# Patient Record
Sex: Female | Born: 1950 | Race: Black or African American | Hispanic: No | Marital: Married | Smoking: Never smoker
Health system: Southern US, Community
[De-identification: ages and names within clinical notes are randomized; demographics above are authoritative.]

## PROBLEM LIST (undated history)

## (undated) DIAGNOSIS — H269 Unspecified cataract: Secondary | ICD-10-CM

## (undated) DIAGNOSIS — E785 Hyperlipidemia, unspecified: Secondary | ICD-10-CM

## (undated) DIAGNOSIS — M1990 Unspecified osteoarthritis, unspecified site: Secondary | ICD-10-CM

## (undated) DIAGNOSIS — I10 Essential (primary) hypertension: Secondary | ICD-10-CM

## (undated) DIAGNOSIS — E119 Type 2 diabetes mellitus without complications: Secondary | ICD-10-CM

## (undated) DIAGNOSIS — B54 Unspecified malaria: Secondary | ICD-10-CM

## (undated) HISTORY — DX: Unspecified cataract: H26.9

## (undated) HISTORY — DX: Hyperlipidemia, unspecified: E78.5

## (undated) HISTORY — PX: CATARACT EXTRACTION, BILATERAL: SHX1313

## (undated) HISTORY — DX: Unspecified osteoarthritis, unspecified site: M19.90

---

## 2004-06-02 DIAGNOSIS — I10 Essential (primary) hypertension: Secondary | ICD-10-CM

## 2004-06-02 HISTORY — DX: Essential (primary) hypertension: I10

## 2014-09-20 DIAGNOSIS — B54 Unspecified malaria: Secondary | ICD-10-CM

## 2014-09-20 HISTORY — DX: Unspecified malaria: B54

## 2014-09-27 ENCOUNTER — Encounter (HOSPITAL_BASED_OUTPATIENT_CLINIC_OR_DEPARTMENT_OTHER): Payer: Self-pay | Admitting: *Deleted

## 2014-09-27 ENCOUNTER — Emergency Department (HOSPITAL_BASED_OUTPATIENT_CLINIC_OR_DEPARTMENT_OTHER): Payer: Self-pay

## 2014-09-27 ENCOUNTER — Inpatient Hospital Stay (HOSPITAL_BASED_OUTPATIENT_CLINIC_OR_DEPARTMENT_OTHER)
Admission: EM | Admit: 2014-09-27 | Discharge: 2014-10-02 | DRG: 871 | Disposition: A | Discharge status: Home Health | Payer: Self-pay | Attending: Internal Medicine | Admitting: Internal Medicine

## 2014-09-27 DIAGNOSIS — B509 Plasmodium falciparum malaria, unspecified: Secondary | ICD-10-CM | POA: Diagnosis present

## 2014-09-27 DIAGNOSIS — A419 Sepsis, unspecified organism: Principal | ICD-10-CM | POA: Diagnosis present

## 2014-09-27 DIAGNOSIS — E872 Acidosis: Secondary | ICD-10-CM | POA: Diagnosis present

## 2014-09-27 DIAGNOSIS — N179 Acute kidney failure, unspecified: Secondary | ICD-10-CM | POA: Diagnosis present

## 2014-09-27 DIAGNOSIS — E871 Hypo-osmolality and hyponatremia: Secondary | ICD-10-CM | POA: Diagnosis present

## 2014-09-27 DIAGNOSIS — Z8613 Personal history of malaria: Secondary | ICD-10-CM

## 2014-09-27 DIAGNOSIS — D594 Other nonautoimmune hemolytic anemias: Secondary | ICD-10-CM | POA: Diagnosis present

## 2014-09-27 DIAGNOSIS — R740 Nonspecific elevation of levels of transaminase and lactic acid dehydrogenase [LDH]: Secondary | ICD-10-CM | POA: Diagnosis present

## 2014-09-27 DIAGNOSIS — R6521 Severe sepsis with septic shock: Secondary | ICD-10-CM | POA: Diagnosis present

## 2014-09-27 DIAGNOSIS — D696 Thrombocytopenia, unspecified: Secondary | ICD-10-CM

## 2014-09-27 DIAGNOSIS — B54 Unspecified malaria: Secondary | ICD-10-CM | POA: Diagnosis present

## 2014-09-27 DIAGNOSIS — I10 Essential (primary) hypertension: Secondary | ICD-10-CM | POA: Diagnosis present

## 2014-09-27 DIAGNOSIS — R17 Unspecified jaundice: Secondary | ICD-10-CM | POA: Diagnosis present

## 2014-09-27 DIAGNOSIS — Z452 Encounter for adjustment and management of vascular access device: Secondary | ICD-10-CM

## 2014-09-27 DIAGNOSIS — D649 Anemia, unspecified: Secondary | ICD-10-CM

## 2014-09-27 DIAGNOSIS — E876 Hypokalemia: Secondary | ICD-10-CM | POA: Diagnosis present

## 2014-09-27 DIAGNOSIS — D6959 Other secondary thrombocytopenia: Secondary | ICD-10-CM | POA: Diagnosis present

## 2014-09-27 HISTORY — DX: Unspecified malaria: B54

## 2014-09-27 HISTORY — DX: Essential (primary) hypertension: I10

## 2014-09-27 LAB — CBC
HCT: 33.3 % — ABNORMAL LOW (ref 36.0–46.0)
HEMOGLOBIN: 12.5 g/dL (ref 12.0–15.0)
MCH: 29.1 pg (ref 26.0–34.0)
MCHC: 37.5 g/dL — ABNORMAL HIGH (ref 30.0–36.0)
MCV: 77.6 fL — ABNORMAL LOW (ref 78.0–100.0)
PLATELETS: 18 10*3/uL — AB (ref 150–400)
RBC: 4.29 MIL/uL (ref 3.87–5.11)
RDW: 12.8 % (ref 11.5–15.5)
WBC: 9.5 10*3/uL (ref 4.0–10.5)

## 2014-09-27 LAB — URINE MICROSCOPIC-ADD ON

## 2014-09-27 LAB — URINALYSIS, ROUTINE W REFLEX MICROSCOPIC
Glucose, UA: >1000 mg/dL — AB
KETONES UR: 15 mg/dL — AB
NITRITE: POSITIVE — AB
Protein, ur: 100 mg/dL — AB
Specific Gravity, Urine: 1.025 (ref 1.005–1.030)
UROBILINOGEN UA: 4 mg/dL — AB (ref 0.0–1.0)
pH: 5 (ref 5.0–8.0)

## 2014-09-27 LAB — COMPREHENSIVE METABOLIC PANEL
ALBUMIN: 2.9 g/dL — AB (ref 3.5–5.2)
ALK PHOS: 122 U/L — AB (ref 39–117)
ALT: 33 U/L (ref 0–35)
ANION GAP: 18 — AB (ref 5–15)
AST: 83 U/L — ABNORMAL HIGH (ref 0–37)
BUN: 25 mg/dL — AB (ref 6–23)
CO2: 23 mmol/L (ref 19–32)
CREATININE: 1.28 mg/dL — AB (ref 0.50–1.10)
Calcium: 7.8 mg/dL — ABNORMAL LOW (ref 8.4–10.5)
Chloride: 86 mmol/L — ABNORMAL LOW (ref 96–112)
GFR calc non Af Amer: 44 mL/min — ABNORMAL LOW (ref >90)
GFR, EST AFRICAN AMERICAN: 51 mL/min — AB (ref >90)
GLUCOSE: 289 mg/dL — AB (ref 70–99)
Potassium: 2.7 mmol/L — CL (ref 3.5–5.1)
Sodium: 127 mmol/L — ABNORMAL LOW (ref 135–145)
TOTAL PROTEIN: 6.8 g/dL (ref 6.0–8.3)
Total Bilirubin: 4 mg/dL — ABNORMAL HIGH (ref 0.3–1.2)

## 2014-09-27 LAB — I-STAT CG4 LACTIC ACID, ED: LACTIC ACID, VENOUS: 7.71 mmol/L — AB (ref 0.5–2.0)

## 2014-09-27 MED ORDER — SODIUM CHLORIDE 0.9 % IV BOLUS (SEPSIS)
1000.0000 mL | Freq: Once | INTRAVENOUS | Status: AC
Start: 2014-09-27 — End: 2014-09-27
  Administered 2014-09-27: 1000 mL via INTRAVENOUS

## 2014-09-27 MED ORDER — DOXYCYCLINE HYCLATE 100 MG IV SOLR
100.0000 mg | Freq: Two times a day (BID) | INTRAVENOUS | Status: DC
  Administered 2014-09-28 – 2014-09-29 (×3): 100 mg via INTRAVENOUS
  Filled 2014-09-27 (×4): qty 100

## 2014-09-27 MED ORDER — ACETAMINOPHEN 500 MG PO TABS
1000.0000 mg | ORAL_TABLET | Freq: Once | ORAL | Status: AC
  Administered 2014-09-27: 1000 mg via ORAL
  Filled 2014-09-27: qty 2

## 2014-09-27 MED ORDER — DOXYCYCLINE HYCLATE 100 MG PO TABS
100.0000 mg | ORAL_TABLET | Freq: Once | ORAL | Status: AC
  Administered 2014-09-27: 100 mg via ORAL

## 2014-09-27 MED ORDER — SODIUM CHLORIDE 0.9 % IV SOLN
12.0000 mg/kg | Freq: Three times a day (TID) | INTRAVENOUS | Status: DC
  Filled 2014-09-27: qty 11.57

## 2014-09-27 MED ORDER — SODIUM CHLORIDE 0.9 % IV SOLN
12.0000 mg/kg | Freq: Three times a day (TID) | INTRAVENOUS | Status: DC
  Administered 2014-09-28 – 2014-09-29 (×4): 925.6 mg via INTRAVENOUS
  Filled 2014-09-27 (×7): qty 11.57

## 2014-09-27 MED ORDER — POTASSIUM CHLORIDE 10 MEQ/100ML IV SOLN
10.0000 meq | Freq: Once | INTRAVENOUS | Status: AC
  Administered 2014-09-27: 10 meq via INTRAVENOUS
  Filled 2014-09-27: qty 100

## 2014-09-27 MED ORDER — SODIUM CHLORIDE 0.9 % IV SOLN
24.0000 mg/kg | Freq: Once | INTRAVENOUS | Status: DC
  Filled 2014-09-27: qty 23.13

## 2014-09-27 MED ORDER — POTASSIUM CHLORIDE CRYS ER 20 MEQ PO TBCR
40.0000 meq | EXTENDED_RELEASE_TABLET | Freq: Once | ORAL | Status: AC
  Administered 2014-09-27: 40 meq via ORAL
  Filled 2014-09-27: qty 2

## 2014-09-27 MED ORDER — DOXYCYCLINE HYCLATE 100 MG IV SOLR: 100.0000 mg | Freq: Two times a day (BID) | INTRAVENOUS | Status: DC

## 2014-09-27 MED ORDER — DOXYCYCLINE HYCLATE 100 MG PO TABS
ORAL_TABLET | ORAL | Status: AC
  Administered 2014-09-27: 100 mg via ORAL
  Filled 2014-09-27: qty 1

## 2014-09-27 MED ORDER — SODIUM CHLORIDE 0.9 % IV SOLN
1850.0000 mg | Freq: Once | INTRAVENOUS | Status: AC
Start: 2014-09-27 — End: 2014-09-28
  Administered 2014-09-28: 1850 mg via INTRAVENOUS
  Filled 2014-09-27: qty 23.13

## 2014-09-27 NOTE — ED Notes (Signed)
Infection prevention RN called post [place] screening questions and spoke with Willey Blade RN at [phone].

## 2014-09-27 NOTE — Consult Note (Signed)
PULMONARY  / CRITICAL CARE MEDICINE  Name: [name] MRN: [id number] DOB: 1951-03-31    LOS: 10  REFERRING MD :  Dr. Baxter Flattery  CHIEF COMPLAINT:  Fever, myalgia, nonbloody diarrhea  BRIEF PATIENT DESCRIPTION: [name] is a 64 year old female with hypertension from [place] who presented to [place] with subacute onset fever/chills associated with weakness, diarrhea, dark urine concerning for malaria.  HISTORY OF PRESENT ILLNESS: [name] is a 64 year old female with hypertension from [place] who presented to [place] with fever/chills associated with weakness, diarrhea, dark urine, headache localized to "behind her eyes" that began on 4/22 and 4/23. Before then, she first traveled to [place] from [place] to visit her family on 4/11 and was in her normal state of health though attributes going outside in cold weather without wearing a jacket as the cause of her current presentation. She also reports being evaluated by her regular doctor prior to leaving [place] without any abnormal findings. Of note, she also reports a past history of malaria, roughly 7-8 episodes since she was a child. Her fevers have been most notable over the last 3 days and occur during both daytime and nighttime. She denies any recent surgery, recent hospitalization, sick contacts, animal contact, recent travel to other countries though does live with other individuals at home and sells eggs for living. At outside hospital, she was found to have a fever 494.4 with systolic BP 96P-591M, and initial lab work was notable for Na 127, K 2.7, bilirubin 4, lactate 7.7, and peripheral smear notable for high parasite burden [unavailable for review]. ID was consulted and recommended management for severe malaria with doxycycline IV and quinidine IV, so she was transferred to [facility]. PCCM was consulted for closer monitoring.  PAST MEDICAL HISTORY :  Past Medical History  Diagnosis Date  . Hypertension    Past  Surgical History  Procedure Laterality Date  . Cesarean section     Prior to Admission medications   Medication Sig Start Date End Date Taking? Authorizing Provider  ibuprofen (ADVIL,MOTRIN) 400 MG tablet Take 400 mg by mouth every 6 (six) hours as needed.   Yes Historical Provider, MD   No Known Allergies  FAMILY HISTORY:  History reviewed. No pertinent family history. SOCIAL HISTORY:  reports that she has never smoked. She does not have any smokeless tobacco history on file. She reports that she does not drink alcohol. Her drug history is not on file.  REVIEW OF SYSTEMS:  As noted in the history of present illness   INTERVAL HISTORY:   VITAL SIGNS: Temp:  [98.4 F (36.9 C)-100.5 F (38.1 C)] 98.4 F (36.9 C) (04/27 2315) Pulse Rate:  [75-116] 80 (04/28 0245) Resp:  [16-30] 25 (04/28 0245) BP: (77-122)/(33-69) 77/51 mmHg (04/28 0245) SpO2:  [94 %-100 %] 94 % (04/28 0245) Weight:  [170 lb (77.111 kg)-172 lb 9.9 oz (78.3 kg)] 172 lb 9.9 oz (78.3 kg) (04/27 2315) HEMODYNAMICS:   VENTILATOR SETTINGS:   INTAKE / OUTPUT: Intake/Output      04/27 0701 - 04/28 0700   IV Piggyback 2750   Total Intake(mL/kg) 2750 (35.1)   Urine (mL/kg/hr) 1100   Total Output 1100   Net +1650        PHYSICAL EXAMINATION: General: Elderly African woman, resting in bed, NAD Neuro: responds to questions appropriately; moving all extremities freely HEENT: PERRL, EOMI, mild scleral icterus, oropharynx clear with sublingual jaundice Cardiac: RRR, no rubs, murmurs or gallops Pulm: clear to auscultation bilaterally,  no wheezes, rales, or rhonchi Abd: soft, nontender, nondistended, BS present Ext: warm and well perfused, no pedal edema  LABS: Cbc  Recent Labs Lab 09/27/14 1845  WBC 9.5  HGB 12.5  HCT 33.3*  PLT 18*   Chemistry  Recent Labs Lab 09/27/14 1845 09/28/14 0047  NA 127* 128*  K 2.7* 3.5  CL 86* 94*  CO2 23 24  BUN 25* 17  CREATININE 1.28* 0.94  CALCIUM 7.8* 7.0*   GLUCOSE 289* 340*   Liver fxn  Recent Labs Lab 09/27/14 1845 09/28/14 0047  AST 83* 51*  ALT 33 24  ALKPHOS 122* 80  BILITOT 4.0* 3.6*  PROT 6.8 5.2*  ALBUMIN 2.9* 2.0*   coags  Recent Labs Lab 09/28/14 0047  INR 1.12   Sepsis markers  Recent Labs Lab 09/27/14 1855  LATICACIDVEN 7.71*   DIAGNOSES: Active Problems:   Malaria   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  PLAN:   Continue following  CARDIOVASCULAR  ASSESSMENT:  Septic shock: Likely secondary to malaria. Blood pressure minimally responsive to 3.5 L. Baseline EKG findings reassuring for no acute ischemia.  PLAN:  Place central line for pressor support Check baseline EKG for QTC and every 8 hours thereafter. Continue cardiac monitoring for adverse effects secondary to quinidine: Hourly QTC monitoring/ Stop infusion if greater than 50% of baseline value. Stop infusion for 1 hour and decrease rate infusion if greater than 25% of baseline, and call Dr. Graylon Good [phone]  RENAL  ASSESSMENT:   Acute kidney injury: Creatinine 1.3 on admission, baseline unknown, though improving. Hyponatremia: Likely hypovolemic in the setting of her acute illness.  Hypokalemia: Likely 2/2 diarrhea. Resolved with K supplementation.  PLAN:   Continue normal saline at 125 mL/hour   GASTROINTESTINAL  ASSESSMENT:   Abnormal LFTs: Likely secondary to malaria. Elevated AST, alkaline phosphatase Hyperbilirubinemia: Likely secondary to hemolysis in the setting of malaria.   PLAN:   Follow CMET   HEMATOLOGIC  ASSESSMENT:   Severe thrombocytopenia: Likely secondary to malaria. Platelets 18 on admission.  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 Ebola less likely given the absence of active bleeding or signs or symptoms of hemorrhage.   PLAN:   ANTIBIOTICS: Doxycycline 4/28>>  CULTURES: Blood cultures 4/27>> Urine cultures 4/27>> Peripheral smear 4/28> prelim results suggestive of falciparum malaria  Repeat blood smear here and every 12 hours for next 2-3 days Consider ceftriaxone should diarrheal illness persist to cover for typhoid fever   ENDOCRINE  ASSESSMENT:   Hyperglycemia: Glucose 289 on admission.   PLAN:   Follow CMET to watch for hypoglycemia secondary to quinidine   NEUROLOGIC  ASSESSMENT:   No acute issues: Mental status intact. PLAN:   Continue assessing given the severity of her illness.   CLINICAL SUMMARY: [name] is a 64 year old female with hypertension from [place] who presented to [place] with subacute onset fever/chills associated with weakness, diarrhea, dark urine concerning for falciparum malaria. Will follow to monitor given the severity of her illness as well as for adverse effects of quinidine.  Charlott Rakes, PGY1 Internal Medicine Pager: [phone]  Confirmed malaria by smear, ID consulted and appreciate input.  Lungs clear on exam, no evidence of splenomegaly on exam.  Continue quinidine for now.  Watch urine cultures.  Serial smears.  Continue levophed and titrate for SBP of 100.  Hydrate.  BMET in AM and CBC in AM.  Thrombocytopenia noted, will monitor.  Hyperbilirubinemia noted.    The patient is critically ill with multiple organ systems failure and requires high complexity decision making for assessment and support, frequent evaluation and titration of therapies, application of advanced monitoring technologies and extensive interpretation of multiple databases.   Critical Care Time devoted to patient care services described in this note is  35  Minutes. This time reflects time of care of this signee Dr Jennet Maduro. This critical care time does not  reflect procedure time, or teaching time or supervisory time of PA/NP/Med student/Med Resident etc but could involve care discussion time.  Rush Farmer, M.D. [facility] Pulmonary/Critical Care Medicine. Pager: [phone]. After hours pager: [phone].  09/28/2014, 3:20 AM

## 2014-09-27 NOTE — ED Notes (Signed)
Pt placed on cardiac monitor 

## 2014-09-27 NOTE — Progress Notes (Addendum)
I was called by Shirlean Mylar, PA who assessed [name]. A 63yo F, originally from [place], came to [place] roughly 3 weeks ago, started to have malaise, myalgias, fever, and now diarrhea in the last week. Found to be febrile at 100.5. Labs showed plt 18K, hyponatremia, hypokalemia, hyperbili 4. Cr elevated at 1.28. Dark urine. Found to have metabolic acidosis with LA 7.7, Peripheral blood smear showing high parasitemia (% not reported). She was stared on IVF, receiving 3rd L. No evidence of hypoglycemia, no altered mental status. Preliminary this is suggestive of severe malaria. Will have her transferred to [place]. Patient to start on doxycycline 100mg  IV q 12, and then will arrange to have her in ICU for close observation while receiving IV quinidine. Spoke with pharmacy, who will be coordinating IV infusion.  Recommend repeat blood smear now (baseline) and every 12 hrs x 2-3 days  Recommend IV load dose of 24 mg salt/kg infused IV over 4 hrs, followed by 12 mg/kg salt infused over 4 hours every 8 hours, starting 8 hours after the loading dose. (please see malaria order set). Continue with doxycycline 100mg  IV BID  Will need baseline EKG once the quinidine starts infusion and Q 8 hr thereafter  - The QT interval should be monitored hourly and the infusion should be stopped if the corrected QT interval becomes prolonged by more than 50 percent of the baseline value. IF QTc is >25% than baseline please contact me at [phone]. Temporarily stop infusion x 3min, and decrease rate of infusion  - The infusion can be renewed (without a bolus) once the QTc falls to <25 percent above the original value.  - would watch for hypoglycemia as side effect of quinidine, in addition to arrythmia  - continue with supportive care. Recommend to repeat peripheral blood smear tomorrow morning  - recommend Q 12hr labs concerning for worsening metab acidosis and worsening renal failure, and hemolysis  - recommend to check  for dengue  Will have formal ID consultation in the morning by Dr. Linus Salmons.  Elzie Rings [place] [facility] [phone]

## 2014-09-27 NOTE — ED Provider Notes (Signed)
CSN: [id number]     Arrival date & time 09/27/14  1740 History   First MD Initiated Contact with Patient 09/27/14 1805     Chief Complaint  Patient presents with  . Fever     (Consider location/radiation/quality/duration/timing/severity/associated sxs/prior Treatment) HPI Comments: 64 y/o [place] female presenting with her daughter living and generalized body aches and fever 5 days. Tmax has been 101 over the past 2 days. She had ibuprofen around noon today. Reports there is some pain to her lower back. Denies increased urinary frequency, urgency, dysuria or hematuria. Reports a few episodes of nonbloody diarrhea. Denies nausea or vomiting. Denies chest pain, shortness of breath. Admits to a "very minimal" non-productive cough. No sick contacts. Traveled from [place] to [place] 3 weeks ago. Has never been in [place] before.  Patient is a 64 y.o. female presenting with fever. The history is provided by the patient and a relative.  Fever Associated symptoms: cough (minimal) and myalgias     Past Medical History  Diagnosis Date  . Hypertension    Past Surgical History  Procedure Laterality Date  . Cesarean section     History reviewed. No pertinent family history. History  Substance Use Topics  . Smoking status: Never Smoker   . Smokeless tobacco: Not on file  . Alcohol Use: No   OB History    No data available     Review of Systems  Constitutional: Positive for fever.  Respiratory: Positive for cough (minimal).   Musculoskeletal: Positive for myalgias and arthralgias.  All other systems reviewed and are negative.     Allergies  Review of patient's allergies indicates no known allergies.  Home Medications   Prior to Admission medications   Medication Sig Start Date End Date Taking? Authorizing Provider  ibuprofen (ADVIL,MOTRIN) 400 MG tablet Take 400 mg by mouth every 6 (six) hours as needed.   Yes Historical Provider, MD   BP 108/63 mmHg  Pulse 91  Temp(Src) 100.5  F (38.1 C) (Oral)  Resp 24  Ht 5\' 3"  (1.6 m)  Wt 170 lb (77.111 kg)  BMI 30.12 kg/m2  SpO2 97% Physical Exam  Constitutional: She is oriented to person, place, and time. She appears well-developed and well-nourished. No distress.  HENT:  Head: Normocephalic and atraumatic.  Mouth/Throat: Oropharynx is clear and moist.  Eyes: Conjunctivae and EOM are normal. Pupils are equal, round, and reactive to light.  Neck: Normal range of motion. Neck supple. No Brudzinski's sign and no Kernig's sign noted.  Cardiovascular: Regular rhythm, normal heart sounds and intact distal pulses.   Tachycardic.  Pulmonary/Chest: Effort normal and breath sounds normal. No respiratory distress.  Abdominal: Soft. Bowel sounds are normal. She exhibits no distension. There is no tenderness.  Musculoskeletal: Normal range of motion. She exhibits no edema.  Mild tenderness across lower back.  Neurological: She is alert and oriented to person, place, and time. No sensory deficit.  Skin: Skin is warm and dry.  Psychiatric: She has a normal mood and affect. Her behavior is normal.  Nursing note and vitals reviewed.   ED Course  Procedures (including critical care time) Labs Review Labs Reviewed  CBC - Abnormal; Notable for the following:    HCT 33.3 (*)    MCV 77.6 (*)    MCHC 37.5 (*)    Platelets 18 (*)    All other components within normal limits  COMPREHENSIVE METABOLIC PANEL - Abnormal; Notable for the following:    Sodium 127 (*)  Potassium 2.7 (*)    Chloride 86 (*)    Glucose, Bld 289 (*)    BUN 25 (*)    Creatinine, Ser 1.28 (*)    Calcium 7.8 (*)    Albumin 2.9 (*)    AST 83 (*)    Alkaline Phosphatase 122 (*)    Total Bilirubin 4.0 (*)    GFR calc non Af Amer 44 (*)    GFR calc Af Amer 51 (*)    Anion gap 18 (*)    All other components within normal limits  URINALYSIS, ROUTINE W REFLEX MICROSCOPIC - Abnormal; Notable for the following:    Color, Urine ORANGE (*)    APPearance TURBID  (*)    Glucose, UA >1000 (*)    Hgb urine dipstick MODERATE (*)    Bilirubin Urine MODERATE (*)    Ketones, ur 15 (*)    Protein, ur 100 (*)    Urobilinogen, UA 4.0 (*)    Nitrite POSITIVE (*)    Leukocytes, UA SMALL (*)    All other components within normal limits  URINE MICROSCOPIC-ADD ON - Abnormal; Notable for the following:    Bacteria, UA MANY (*)    Casts GRANULAR CAST (*)    All other components within normal limits  I-STAT CG4 LACTIC ACID, ED - Abnormal; Notable for the following:    Lactic Acid, Venous 7.71 (*)    All other components within normal limits  MALARIA SMEAR  CULTURE, BLOOD (ROUTINE X 2)  CULTURE, BLOOD (ROUTINE X 2)  URINE CULTURE  LACTIC ACID, PLASMA  HIV ANTIBODY (ROUTINE TESTING)  I-STAT CG4 LACTIC ACID, ED    Imaging Review Dg Chest 2 View  09/27/2014   CLINICAL DATA:  Fever, cough, bodyaches, vomiting. diarrhea, weakness x 5 days. Pt recently traveled to/from [place]. HX:htn,nonsmoker  EXAM: CHEST  2 VIEW  COMPARISON:  None.  FINDINGS: Normal heart, mediastinum and hila. Clear lungs. No pleural effusion or pneumothorax.  Bony thorax is intact.  IMPRESSION: No active cardiopulmonary disease.   Electronically Signed   By: Lajean Manes M.D.   On: 09/27/2014 19:43     EKG Interpretation None      MDM   Final diagnoses:  Malaria  Malaria fever  Hypokalemia  Hyponatremia  Thrombocytopenia   Patient with fever, generalized body aches and diarrhea. Recent travel from [place]. Nontoxic appearing, NAD. Fever of 100 in the ED. Tachycardic. Vitals otherwise stable. Lungs clear. Plan to give IV fluids, obtain labs, chest x-ray and urinalysis along with malaria screen.  Potassium replaced PO and IV. I spoke with Dr. Baxter Flattery with ID who suggests starting pt on quinidine, and since this medication is not available here at [facility], start doxycycline 100 mg every 12 hours until she can receive quinidine. She has been receiving IV fluids  continuously, she is on her second bolus. Sepsis orders placed for fluid replacement. HIV ordered as per Dr. Baxter Flattery. Patient will be admitted to the stepdown unit, as when she receives quinidine, she is at risk for cardiac side effects. Admission accepted by Dr. Clementeen Graham, [facility].  Discussed with attending Dr. Maryan Rued who also evaluated patient and agrees with plan of care.  Addendum- IV doxy unavailable here at [facility]. Will give a dose of oral doxy prior to transfer  Carman Ching, PA-C 09/27/14 2038  Carman Ching, PA-C 09/27/14 2101  Blanchie Dessert, MD 09/27/14 2336

## 2014-09-27 NOTE — ED Notes (Signed)
Pt c/o bodyaches fever x 5 days

## 2014-09-27 NOTE — ED Notes (Signed)
MD at bedside discussing dispo plan of care. 

## 2014-09-28 ENCOUNTER — Telehealth (HOSPITAL_BASED_OUTPATIENT_CLINIC_OR_DEPARTMENT_OTHER): Payer: Self-pay | Admitting: Emergency Medicine

## 2014-09-28 ENCOUNTER — Inpatient Hospital Stay (HOSPITAL_COMMUNITY): Payer: Self-pay

## 2014-09-28 DIAGNOSIS — R197 Diarrhea, unspecified: Secondary | ICD-10-CM

## 2014-09-28 DIAGNOSIS — D696 Thrombocytopenia, unspecified: Secondary | ICD-10-CM

## 2014-09-28 DIAGNOSIS — R17 Unspecified jaundice: Secondary | ICD-10-CM

## 2014-09-28 DIAGNOSIS — N179 Acute kidney failure, unspecified: Secondary | ICD-10-CM

## 2014-09-28 DIAGNOSIS — Z9189 Other specified personal risk factors, not elsewhere classified: Secondary | ICD-10-CM

## 2014-09-28 DIAGNOSIS — B54 Unspecified malaria: Secondary | ICD-10-CM

## 2014-09-28 DIAGNOSIS — E872 Acidosis: Secondary | ICD-10-CM

## 2014-09-28 LAB — GLUCOSE, CAPILLARY
GLUCOSE-CAPILLARY: 307 mg/dL — AB (ref 70–99)
GLUCOSE-CAPILLARY: 198 mg/dL — AB (ref 70–99)
GLUCOSE-CAPILLARY: 144 mg/dL — AB (ref 70–99)

## 2014-09-28 LAB — CBC
HEMATOCRIT: 26.9 % — AB (ref 36.0–46.0)
Hemoglobin: 9.8 g/dL — ABNORMAL LOW (ref 12.0–15.0)
MCH: 28.7 pg (ref 26.0–34.0)
MCHC: 36.4 g/dL — AB (ref 30.0–36.0)
MCV: 78.9 fL (ref 78.0–100.0)
Platelets: 49 10*3/uL — ABNORMAL LOW (ref 150–400)
RBC: 3.41 MIL/uL — AB (ref 3.87–5.11)
RDW: 14.4 % (ref 11.5–15.5)
WBC: 9.3 10*3/uL (ref 4.0–10.5)

## 2014-09-28 LAB — COMPREHENSIVE METABOLIC PANEL
ALBUMIN: 2 g/dL — AB (ref 3.5–5.2)
ALBUMIN: 2 g/dL — AB (ref 3.5–5.2)
ALK PHOS: 76 U/L (ref 39–117)
ALT: 22 U/L (ref 0–35)
ALT: 24 U/L (ref 0–35)
AST: 42 U/L — AB (ref 0–37)
AST: 51 U/L — AB (ref 0–37)
Alkaline Phosphatase: 80 U/L (ref 39–117)
Anion gap: 10 (ref 5–15)
Anion gap: 10 (ref 5–15)
BUN: 17 mg/dL (ref 6–23)
BUN: 8 mg/dL (ref 6–23)
CALCIUM: 7 mg/dL — AB (ref 8.4–10.5)
CO2: 23 mmol/L (ref 19–32)
CO2: 24 mmol/L (ref 19–32)
Calcium: 7.4 mg/dL — ABNORMAL LOW (ref 8.4–10.5)
Chloride: 94 mmol/L — ABNORMAL LOW (ref 96–112)
Chloride: 98 mmol/L (ref 96–112)
Creatinine, Ser: 0.94 mg/dL (ref 0.50–1.10)
Creatinine, Ser: 0.73 mg/dL (ref 0.50–1.10)
GFR calc Af Amer: >90 mL/min (ref >90)
GFR calc non Af Amer: 64 mL/min — ABNORMAL LOW (ref >90)
GFR calc non Af Amer: 90 mL/min — ABNORMAL LOW (ref >90)
GFR, EST AFRICAN AMERICAN: 74 mL/min — AB (ref >90)
GLUCOSE: 291 mg/dL — AB (ref 70–99)
Glucose, Bld: 340 mg/dL — ABNORMAL HIGH (ref 70–99)
Potassium: 3.5 mmol/L (ref 3.5–5.1)
Potassium: 3 mmol/L — ABNORMAL LOW (ref 3.5–5.1)
SODIUM: 128 mmol/L — AB (ref 135–145)
Sodium: 131 mmol/L — ABNORMAL LOW (ref 135–145)
Total Bilirubin: 3.6 mg/dL — ABNORMAL HIGH (ref 0.3–1.2)
Total Bilirubin: 4.7 mg/dL — ABNORMAL HIGH (ref 0.3–1.2)
Total Protein: 5.2 g/dL — ABNORMAL LOW (ref 6.0–8.3)
Total Protein: 5.3 g/dL — ABNORMAL LOW (ref 6.0–8.3)

## 2014-09-28 LAB — MRSA PCR SCREENING: MRSA BY PCR: NEGATIVE

## 2014-09-28 LAB — SAVE SMEAR

## 2014-09-28 LAB — PROTIME-INR
INR: 1.12 (ref 0.00–1.49)
Prothrombin Time: 14.5 seconds (ref 11.6–15.2)

## 2014-09-28 LAB — PATHOLOGIST SMEAR REVIEW

## 2014-09-28 MED ORDER — ACETAMINOPHEN 325 MG PO TABS
650.0000 mg | ORAL_TABLET | Freq: Four times a day (QID) | ORAL | Status: DC | PRN
  Administered 2014-09-28 – 2014-09-30 (×3): 650 mg via ORAL
  Filled 2014-09-28 (×3): qty 2

## 2014-09-28 MED ORDER — INSULIN ASPART 100 UNIT/ML ~~LOC~~ SOLN
0.0000 [IU] | SUBCUTANEOUS | Status: DC
  Administered 2014-09-28: 2 [IU] via SUBCUTANEOUS
  Administered 2014-09-28: 11 [IU] via SUBCUTANEOUS
  Administered 2014-09-28: 3 [IU] via SUBCUTANEOUS
  Administered 2014-09-29 (×2): 2 [IU] via SUBCUTANEOUS
  Administered 2014-09-29: 5 [IU] via SUBCUTANEOUS

## 2014-09-28 MED ORDER — SODIUM CHLORIDE 0.9 % IV BOLUS (SEPSIS)
500.0000 mL | Freq: Once | INTRAVENOUS | Status: AC
  Administered 2014-09-28: 500 mL via INTRAVENOUS

## 2014-09-28 MED ORDER — SODIUM CHLORIDE 0.9 % IV BOLUS (SEPSIS)
1000.0000 mL | Freq: Once | INTRAVENOUS | Status: AC
  Administered 2014-09-28: 1000 mL via INTRAVENOUS

## 2014-09-28 MED ORDER — DEXTROSE 5 % IV SOLN
0.0000 ug/min | INTRAVENOUS | Status: DC
  Administered 2014-09-28: 3 ug/min via INTRAVENOUS
  Administered 2014-09-28: 17 ug/min via INTRAVENOUS
  Administered 2014-09-28: 11 ug/min via INTRAVENOUS
  Filled 2014-09-28 (×4): qty 4

## 2014-09-28 MED ORDER — SODIUM CHLORIDE 0.9 % IV BOLUS (SEPSIS)
1000.0000 mL | Freq: Once | INTRAVENOUS | Status: AC
Start: 2014-09-28 — End: 2014-09-28
  Administered 2014-09-28: 1000 mL via INTRAVENOUS

## 2014-09-28 MED ORDER — SODIUM CHLORIDE 0.9 % IV SOLN
INTRAVENOUS | Status: DC
  Administered 2014-09-28: 125 mL/h via INTRAVENOUS
  Administered 2014-09-28: 03:00:00 via INTRAVENOUS

## 2014-09-28 NOTE — Progress Notes (Signed)
Patient feeling much better. No headache, no confusion, Mouth feels 'sore' which she states occurs with fever.  Hgb noted and is lower as expected at 9.8.  Tolerating quinidine and will continue for 24 hours and reevaluate in the am for conversion to oral Co-artem.  Will review recent smear.

## 2014-09-28 NOTE — Progress Notes (Signed)
Nutrition Brief Note  Patient identified on the Malnutrition Screening Tool (MST) Report.  Wt Readings from Last 15 Encounters:  09/27/14 172 lb 9.9 oz (78.3 kg)    Body mass index is 30.59 kg/(m^2). 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   Current diet order is Regular.  Labs and medications reviewed.   No nutrition interventions warranted at this time. If nutrition issues arise, please consult RD.   Arthur Holms, RD, LDN Pager #: [phone] After-Hours Pager #: [phone]

## 2014-09-28 NOTE — Procedures (Signed)
Central Venous Catheter Insertion Procedure Note [name] [id number] [birth year]  Procedure: Insertion of Central Venous Catheter Indications: Assessment of intravascular volume, Drug and/or fluid administration and Frequent blood sampling  Procedure Details Consent: Risks of procedure as well as the alternatives and risks of each were explained to the (patient/caregiver).  Consent for procedure obtained. Time Out: Verified patient identification, verified procedure, site/side was marked, verified correct patient position, special equipment/implants available, medications/allergies/relevent history reviewed, required imaging and test results available.  Performed  Maximum sterile technique was used including antiseptics, cap, gloves, gown, hand hygiene, mask and sheet. Skin prep: Chlorhexidine; local anesthetic administered A antimicrobial bonded/coated triple lumen catheter was placed in the right internal jugular vein using the Seldinger technique.  Evaluation Blood flow good Complications: No apparent complications Patient did tolerate procedure well. Chest X-ray ordered to verify placement.  CXR: pending.  Procedure performed under direct ultrasound guidance for real time vessel cannulation.      [name], [place] Pulmonary & Critical Care Medicine Pager: [phone]  or [phone] 09/28/2014, 3:51 AM

## 2014-09-28 NOTE — Progress Notes (Signed)
Inpatient Diabetes Program Recommendations  AACE/ADA: New Consensus Statement on Inpatient Glycemic Control (2013)  Target Ranges:  Prepandial:   less than 140 mg/dL      Peak postprandial:   less than 180 mg/dL (1-2 hours)      Critically ill patients:  140 - 180 mg/dL    Inpatient Diabetes Program Recommendations HgbA1C: order to assess prehospital glucose control  Note: Monitor bedside blood glucose Q4.  Consider starting sensitive scale Q4 if remains elevated 200-300's.  Thank you  Raoul Pitch BSN, RN,CDE Inpatient Diabetes Coordinator [phone] (team pager)

## 2014-09-28 NOTE — Progress Notes (Signed)
Malaria smear results called as critical per Alexia Freestone . Dr. Halford Chessman made aware.

## 2014-09-28 NOTE — Consult Note (Addendum)
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 female F who is originally from [place], pmhx for HTN, recently arrived to the [place] on 09/11/2014. She had been in good state of health until 4/22-4/23, when she had new onset fever/chills that she attributed to being outdoors in the cold poorly clothed. She had started to have malaise, poor appetite, dark urine, and on going fevers in day and night most noticeably over the last 3 days. She traveled to [place] to see her daughter. She presented to [facility] for evaluation.  Prior to leaving for the [place], she was in good state of health. Denies any sick contacts. She is from [place], [place]. Lives in a home with several other people. Distributes/sells eggs for a living.came to [place] to visit with her daughters. She has past history of having malaria in the past, does not take anti-malarials.   She was found to have fever of 100.5, mild hypotension. Her labs were significant for thrombocytopenia, hyponatremia, hypokalemia, elevated Cr. Elevated bilirubin of 4, and lactic acid of 7.7. She had a peripheral blood smear showing high parasitemia (% not reported). She was stared on IVF, receiving 3rd L. No evidence of hypoglycemia, no altered mental status. Preliminary this is suggestive of severe malaria. Plan to have her transferred to [place]. Patient to start on doxycycline 100mg  IV q 12. repleted 75mEQ potassium for hypokalemia. She was admitted to ICU for close observation while receiving IV quinidine.   ROS: did have headache initially with retro-orbital eye pain that is now improved, occ sore back, poor appetite, loose stools from liquid diet, 2 loose stool in 24hr. 10 point ros otherwise negative  She is  from [place], [place], [place]. No recent travel to countries with endemic ebola. Her presentation can be explained by malaria and not other viral hemorrhagic illness.  Past Medical History  Diagnosis Date  . Hypertension     Allergies: No Known Allergies   MEDICATIONS: . doxycycline (VIBRAMYCIN) IV  100 mg Intravenous Q12H  . quiNIDine (MALARIA) LOADING DOSE  1,850 mg Intravenous Once   Followed by  . quiNIDine (MALARIA_) MAINTENANCE DOSE  12 mg/kg Intravenous 3 times per day  . sodium chloride  1,000 mL Intravenous Once  . sodium chloride  1,000 mL Intravenous Once    History  Substance Use Topics  . Smoking status: Never Smoker   . Smokeless tobacco: Not on file  . Alcohol Use: No    History reviewed. No pertinent family history.  Review of Systems -  See hpi, 10 point ros reviewed. OBJECTIVE: Temp:  [98.4 F (36.9 C)-100.5 F (38.1 C)] 98.4 F (36.9 C) (04/27 2315) Pulse Rate:  [82-116] 85 (04/28 0000) Resp:  [16-30] 19 (04/28 0000) BP: (89-122)/(40-69) 91/56 mmHg (04/28 0000) SpO2:  [95 %-100 %] 97 % (04/28 0000) Weight:  [170 lb (77.111 kg)-172 lb 9.9 oz (78.3 kg)] 172 lb 9.9 oz (78.3 kg) (04/27 2315) Physical Exam  Constitutional:  oriented to person, place, and time. appears well-developed and well-nourished. No distress.  HENT: [place]/AT, PERRLA, + scleral icterus., + sublingual jaundice. Pterygium+. Hyperpigmentation to cheeks bilaterally,  flushed Mouth/Throat: Oropharynx is clear and moist. No oropharyngeal exudate.  Cardiovascular: Normal rate, regular rhythm and normal heart sounds. Exam reveals no gallop and no friction rub.  No murmur heard.  Pulmonary/Chest: Effort normal and breath sounds normal. No respiratory distress.  has no wheezes.  Neck = supple, no nuchal rigidity Abdominal: Soft.  exhibits no distension. There is no tenderness.  Lymphadenopathy: no cervical adenopathy. No axillary adenopathy Neurological: alert and oriented to person, place, and  time.  Skin: Skin is warm and dry. No rash noted. No erythema.  Psychiatric: a normal mood and affect.  behavior is normal.   LABS: Results for orders placed or performed during the hospital encounter of 09/27/14 (from the past 48 hour(s))  CBC     Status: Abnormal   Collection Time: 09/27/14  6:45 PM  Result Value Ref Range   WBC 9.5 4.0 - 10.5 K/uL   RBC 4.29 3.87 - 5.11 MIL/uL   Hemoglobin 12.5 12.0 - 15.0 g/dL   HCT 33.3 (L) 36.0 - 46.0 %   MCV 77.6 (L) 78.0 - 100.0 fL   MCH 29.1 26.0 - 34.0 pg   MCHC 37.5 (H) 30.0 - 36.0 g/dL    Comment: RULED OUT INTERFERING SUBSTANCES   RDW 12.8 11.5 - 15.5 %   Platelets 18 (LL) 150 - 400 K/uL    Comment: CRITICAL RESULT CALLED TO, READ BACK BY AND VERIFIED WITH: ROBYN HESS PA AT 1930 09/27/14 BY I.SUGUT REPEATED TO VERIFY PLATELET COUNT CONFIRMED BY SMEAR   Comprehensive metabolic panel     Status: Abnormal   Collection Time: 09/27/14  6:45 PM  Result Value Ref Range   Sodium 127 (L) 135 - 145 mmol/L   Potassium 2.7 (LL) 3.5 - 5.1 mmol/L    Comment: CRITICAL RESULT CALLED TO, READ BACK BY AND VERIFIED WITH: ROBYN HESS PA 09/27/14 AT 1937 BY I.SUGUT REPEATED TO VERIFY    Chloride 86 (L) 96 - 112 mmol/L   CO2 23 19 - 32 mmol/L   Glucose, Bld 289 (H) 70 - 99 mg/dL   BUN 25 (H) 6 - 23 mg/dL   Creatinine, Ser 1.28 (H) 0.50 - 1.10 mg/dL   Calcium 7.8 (L) 8.4 - 10.5 mg/dL   Total Protein 6.8 6.0 - 8.3 g/dL   Albumin 2.9 (L) 3.5 - 5.2 g/dL   AST 83 (H) 0 - 37 U/L   ALT 33 0 - 35 U/L   Alkaline Phosphatase 122 (H) 39 - 117 U/L   Total Bilirubin 4.0 (H) 0.3 - 1.2 mg/dL   GFR calc non Af Amer 44 (L) >90 mL/min   GFR calc Af Amer 51 (L) >90 mL/min    Comment: (NOTE) The eGFR has been calculated using the CKD EPI equation. This calculation has not been validated in all clinical situations. eGFR's persistently <90 mL/min signify possible Chronic Kidney Disease.    Anion gap 18 (H) 5 - 15  I-Stat CG4 Lactic Acid, ED     Status: Abnormal     Collection Time: 09/27/14  6:55 PM  Result Value Ref Range   Lactic Acid, Venous 7.71 (HH) 0.5 - 2.0 mmol/L   Comment NOTIFIED PHYSICIAN   Urinalysis, Routine w reflex microscopic     Status: Abnormal   Collection Time: 09/27/14  7:00 PM  Result Value Ref Range   Color, Urine ORANGE (A) YELLOW    Comment: BIOCHEMICALS MAY BE AFFECTED BY COLOR   APPearance TURBID (A) CLEAR   Specific Gravity, Urine  1.025 1.005 - 1.030   pH 5.0 5.0 - 8.0   Glucose, UA >1000 (A) NEGATIVE mg/dL   Hgb urine dipstick MODERATE (A) NEGATIVE   Bilirubin Urine MODERATE (A) NEGATIVE   Ketones, ur 15 (A) NEGATIVE mg/dL   Protein, ur 100 (A) NEGATIVE mg/dL   Urobilinogen, UA 4.0 (H) 0.0 - 1.0 mg/dL   Nitrite POSITIVE (A) NEGATIVE   Leukocytes, UA SMALL (A) NEGATIVE  Urine microscopic-add on     Status: Abnormal   Collection Time: 09/27/14  7:00 PM  Result Value Ref Range   Squamous Epithelial / LPF RARE RARE   WBC, UA 3-6 <3 WBC/hpf   RBC / HPF 3-6 <3 RBC/hpf   Bacteria, UA MANY (A) RARE   Casts GRANULAR CAST (A) NEGATIVE   Urine-Other MUCOUS PRESENT    hiv testing  MICRO: Blood cx  IMAGING: Dg Chest 2 View  09/27/2014   CLINICAL DATA:  Fever, cough, bodyaches, vomiting. diarrhea, weakness x 5 days. Pt recently traveled to/from [place]. HX:htn,nonsmoker  EXAM: CHEST  2 VIEW  COMPARISON:  None.  FINDINGS: Normal heart, mediastinum and hila. Clear lungs. No pleural effusion or pneumothorax.  Bony thorax is intact.  IMPRESSION: No active cardiopulmonary disease.   Electronically Signed   By: Lajean Manes M.D.   On: 09/27/2014 19:43    Assessment/Plan:  64yo F with fevers, returned traveler from [facility], found to have jaundice, thrombocytopenia, AKI, lactic acidosis, and + smear for parasitemia concerning for severe p.falciparum  Recommend repeat blood smear now (baseline) and every 12 hrs x 2-3 days  Recommend IV load dose of 24 mg salt/kg infused IV over 4 hrs, followed by 12 mg/kg salt  infused over 4 hours every 8 hours, starting 8 hours after the loading dose. (please see malaria order set). Continue with doxycycline $RemoveBeforeDE'100mg'[id number]$  IV BID  Will need baseline EKG once the quinidine starts infusion and Q 8 hr thereafter  - The QT interval should be monitored hourly and the infusion should be stopped if the corrected QT interval becomes prolonged by more than 50 percent of the baseline value. IF QTc is >25% than baseline please contact me at [phone]. Temporarily stop infusion x 50min, and decrease rate of infusion  - The infusion can be renewed (without a bolus) once the QTc falls to <25 percent above the original value.  - would watch for hypoglycemia as side effect of quinidine, in addition to arrythmia  - if unable to tolerate quinidine, then we can call cdc hotline for artesunate approval  - continue with supportive care. Recommend to repeat peripheral blood smear tomorrow morning  - recommend Q 12hr labs concerning for worsening metab acidosis and worsening renal failure, and hemolysis and DIC  - recommend to check for dengue, chickungunya  - if she has further episodes of diarrhea, then would work up for diarrhea and consider empiric ceftriaxone to cover typhoid fever  If question, please call [facility] [phone]

## 2014-09-28 NOTE — Progress Notes (Signed)
Per Montey Hora PA-C, chest x-ray shows that central line is in the correct place and ready to use.

## 2014-09-28 NOTE — Care Management Note (Addendum)
    Page 1 of 1   10/02/2014     6:03:45 PM CARE MANAGEMENT NOTE 10/02/2014  Patient:  [name], [name]   Account Number:  [id number]  Date Initiated:  09/28/2014  Documentation initiated by:  Elissa Hefty  Subjective/Objective Assessment:   adm w fever,malaria  from [place].     Action/Plan:   lives w fam, no ins listed   Anticipated DC Date:  10/03/2014   Anticipated DC Plan:  [place]  CM consult  [facility]      Choice offered to / List presented to:             Status of service:  Completed, signed off Medicare Important Message given?  NO (If response is "NO", the following Medicare IM given date fields will be blank) Date Medicare IM given:   Medicare IM given by:   Date Additional Medicare IM given:   Additional Medicare IM given by:    Discharge Disposition:  HOME/SELF CARE  Per UR Regulation:  Reviewed for med. necessity/level of care/duration of stay  If discussed at [place] of Stay Meetings, dates discussed:    Comments:  10/02/14 [place], BSN [phone] patient  is from [place], she has no insurance, she is scheduled for an f/u apt at [facility].  4/29 1414 debbie dowell rn,bsn left pt inform on [facility]. no ins listed and no pcp listed.

## 2014-09-28 NOTE — Progress Notes (Signed)
[facility] Progress Note Patient Name: [name] DOB: 1951/05/14 MRN: [id number]   Date of Service  09/28/2014  HPI/Events of Note  Hypotension. BP = 85/44. Patient has received 4 Liters of fluid.   eICU Interventions  Will order: 1. Norepinephrine IV infusion.  2. Monitor CVP.     Intervention Category Major Interventions: Hypotension - evaluation and management  Elloise Roark [place] 09/28/2014, 4:08 AM

## 2014-09-29 ENCOUNTER — Encounter (HOSPITAL_COMMUNITY): Payer: Self-pay | Admitting: *Deleted

## 2014-09-29 DIAGNOSIS — I959 Hypotension, unspecified: Secondary | ICD-10-CM

## 2014-09-29 DIAGNOSIS — J810 Acute pulmonary edema: Secondary | ICD-10-CM

## 2014-09-29 DIAGNOSIS — R5081 Fever presenting with conditions classified elsewhere: Secondary | ICD-10-CM

## 2014-09-29 DIAGNOSIS — R0902 Hypoxemia: Secondary | ICD-10-CM

## 2014-09-29 LAB — MALARIA SMEAR

## 2014-09-29 LAB — SAVE SMEAR

## 2014-09-29 LAB — COMPREHENSIVE METABOLIC PANEL
ALK PHOS: 66 U/L (ref 39–117)
ALK PHOS: 73 U/L (ref 39–117)
ALT: 23 U/L (ref 0–35)
ALT: 23 U/L (ref 0–35)
AST: 42 U/L — ABNORMAL HIGH (ref 0–37)
AST: 43 U/L — ABNORMAL HIGH (ref 0–37)
Albumin: 1.9 g/dL — ABNORMAL LOW (ref 3.5–5.2)
Albumin: 1.9 g/dL — ABNORMAL LOW (ref 3.5–5.2)
Anion gap: 7 (ref 5–15)
Anion gap: 7 (ref 5–15)
BUN: 8 mg/dL (ref 6–23)
BUN: 7 mg/dL (ref 6–23)
CALCIUM: 7.5 mg/dL — AB (ref 8.4–10.5)
CALCIUM: 7.7 mg/dL — AB (ref 8.4–10.5)
CHLORIDE: 101 mmol/L (ref 96–112)
CO2: 24 mmol/L (ref 19–32)
CO2: 25 mmol/L (ref 19–32)
CREATININE: 0.72 mg/dL (ref 0.50–1.10)
Chloride: 103 mmol/L (ref 96–112)
Creatinine, Ser: 0.66 mg/dL (ref 0.50–1.10)
GFR calc Af Amer: >90 mL/min (ref >90)
GFR calc non Af Amer: >90 mL/min (ref >90)
GLUCOSE: 166 mg/dL — AB (ref 70–99)
Glucose, Bld: 228 mg/dL — ABNORMAL HIGH (ref 70–99)
Potassium: 3 mmol/L — ABNORMAL LOW (ref 3.5–5.1)
Potassium: 3.3 mmol/L — ABNORMAL LOW (ref 3.5–5.1)
SODIUM: 133 mmol/L — AB (ref 135–145)
Sodium: 134 mmol/L — ABNORMAL LOW (ref 135–145)
TOTAL PROTEIN: 5.6 g/dL — AB (ref 6.0–8.3)
Total Bilirubin: 5.7 mg/dL — ABNORMAL HIGH (ref 0.3–1.2)
Total Bilirubin: 7.1 mg/dL — ABNORMAL HIGH (ref 0.3–1.2)
Total Protein: 6.3 g/dL (ref 6.0–8.3)

## 2014-09-29 LAB — CBC
HCT: 22.7 % — ABNORMAL LOW (ref 36.0–46.0)
Hemoglobin: 8.4 g/dL — ABNORMAL LOW (ref 12.0–15.0)
MCH: 28.8 pg (ref 26.0–34.0)
MCHC: 37 g/dL — ABNORMAL HIGH (ref 30.0–36.0)
MCV: 77.7 fL — AB (ref 78.0–100.0)
Platelets: 38 10*3/uL — ABNORMAL LOW (ref 150–400)
RBC: 2.92 MIL/uL — ABNORMAL LOW (ref 3.87–5.11)
RDW: 13.6 % (ref 11.5–15.5)
WBC: 8.7 10*3/uL (ref 4.0–10.5)

## 2014-09-29 LAB — GLUCOSE, CAPILLARY
GLUCOSE-CAPILLARY: 141 mg/dL — AB (ref 70–99)
GLUCOSE-CAPILLARY: 149 mg/dL — AB (ref 70–99)
GLUCOSE-CAPILLARY: 161 mg/dL — AB (ref 70–99)
GLUCOSE-CAPILLARY: 218 mg/dL — AB (ref 70–99)
Glucose-Capillary: 146 mg/dL — ABNORMAL HIGH (ref 70–99)
Glucose-Capillary: 133 mg/dL — ABNORMAL HIGH (ref 70–99)

## 2014-09-29 LAB — LACTIC ACID, PLASMA: Lactic Acid, Venous: 1.8 mmol/L (ref 0.5–2.0)

## 2014-09-29 LAB — CLOSTRIDIUM DIFFICILE BY PCR: Toxigenic C. Difficile by PCR: NEGATIVE

## 2014-09-29 LAB — HIV ANTIBODY (ROUTINE TESTING W REFLEX): HIV SCREEN 4TH GENERATION: NONREACTIVE

## 2014-09-29 MED ORDER — POTASSIUM CHLORIDE 10 MEQ/50ML IV SOLN
10.0000 meq | INTRAVENOUS | Status: AC
  Administered 2014-09-29 (×4): 10 meq via INTRAVENOUS
  Filled 2014-09-29 (×4): qty 50

## 2014-09-29 MED ORDER — ARTEMETHER-LUMEFANTRINE 20-120 MG PO TABS
4.0000 | ORAL_TABLET | Freq: Two times a day (BID) | ORAL | Status: DC
  Filled 2014-09-29 (×2): qty 4

## 2014-09-29 MED ORDER — INSULIN ASPART 100 UNIT/ML ~~LOC~~ SOLN
0.0000 [IU] | Freq: Three times a day (TID) | SUBCUTANEOUS | Status: DC
  Administered 2014-09-29: 2 [IU] via SUBCUTANEOUS
  Administered 2014-09-29: 3 [IU] via SUBCUTANEOUS
  Administered 2014-09-30: 2 [IU] via SUBCUTANEOUS
  Administered 2014-09-30: 3 [IU] via SUBCUTANEOUS
  Administered 2014-10-01: 2 [IU] via SUBCUTANEOUS
  Administered 2014-10-02: 3 [IU] via SUBCUTANEOUS

## 2014-09-29 MED ORDER — POTASSIUM CHLORIDE CRYS ER 20 MEQ PO TBCR
40.0000 meq | EXTENDED_RELEASE_TABLET | Freq: Once | ORAL | Status: AC
  Administered 2014-09-29: 40 meq via ORAL
  Filled 2014-09-29: qty 2

## 2014-09-29 MED ORDER — ARTEMETHER-LUMEFANTRINE 20-120 MG PO TABS
4.0000 | ORAL_TABLET | Freq: Once | ORAL | Status: AC
  Administered 2014-09-29: 4 via ORAL
  Filled 2014-09-29: qty 4

## 2014-09-29 MED ORDER — ONDANSETRON HCL 4 MG/2ML IJ SOLN
4.0000 mg | Freq: Three times a day (TID) | INTRAMUSCULAR | Status: DC | PRN
  Administered 2014-09-29: 4 mg via INTRAVENOUS
  Filled 2014-09-29: qty 2

## 2014-09-29 NOTE — Progress Notes (Signed)
[facility] for Infectious Disease  Date of Admission:  09/27/2014  Antibiotics: Doxy Quinidine gluc  Subjective: Feels fine, some nausea eralier this am, no headache, some diarrhea  Objective: Temp:  [98.2 F (36.8 C)-102.3 F (39.1 C)] 99.1 F (37.3 C) (04/29 0800) Pulse Rate:  [74-103] 82 (04/29 1000) Resp:  [14-30] 18 (04/29 1000) BP: (111-158)/(47-67) 113/54 mmHg (04/29 1000) SpO2:  [91 %-98 %] 95 % (04/29 1000)  General: awake, alert, nad Skin: no rashes Lungs: CTA B Cor: RRR Abdomen: soft, nt, nd Ext: no edema  Lab Results Lab Results  Component Value Date   WBC 8.7 09/29/2014   HGB 8.4* 09/29/2014   HCT 22.7* 09/29/2014   MCV 77.7* 09/29/2014   PLT 38* 09/29/2014    Lab Results  Component Value Date   CREATININE 0.72 09/29/2014   BUN 8 09/29/2014   NA 133* 09/29/2014   K 3.0* 09/29/2014   CL 101 09/29/2014   CO2 25 09/29/2014    Lab Results  Component Value Date   ALT 23 09/29/2014   AST 42* 09/29/2014   ALKPHOS 73 09/29/2014   BILITOT 5.7* 09/29/2014      Microbiology: Recent Results (from the past 240 hour(s))  Malaria smear     Status: None (Preliminary result)   Collection Time: 09/27/14  6:45 PM  Result Value Ref Range Status   Specimen Description BLOOD  Final   Special Requests NONE  Final   Malaria Prep   Final    PLASMODIUM FALCIPARUM 15% PARASITEMIA REVIEWED BY PATHOLOGIST DR Ezequiel Ganser WINTER 09/28/14 CRITICAL RESULT CALLED TO, READ BACK BY AND VERIFIED WITH: ELLA BETHEL 09/28/14 325PM BY SUMMR Performed at [facility]    Report Status PENDING  Incomplete  Blood culture (routine x 2)     Status: None (Preliminary result)   Collection Time: 09/27/14  6:45 PM  Result Value Ref Range Status   Specimen Description BLOOD LEFT AC  Final   Special Requests BOTTLES DRAWN AEROBIC AND ANAEROBIC 5CC EACH  Final   Culture   Final           BLOOD CULTURE RECEIVED NO GROWTH TO DATE CULTURE WILL BE HELD FOR 5 DAYS BEFORE ISSUING A  FINAL NEGATIVE REPORT Performed at [facility]    Report Status PENDING  Incomplete  Blood culture (routine x 2)     Status: None (Preliminary result)   Collection Time: 09/27/14  7:50 PM  Result Value Ref Range Status   Specimen Description BLOOD RIGHT AC  Final   Special Requests BOTTLES DRAWN AEROBIC AND ANAEROBIC 10CC EACH  Final   Culture   Final           BLOOD CULTURE RECEIVED NO GROWTH TO DATE CULTURE WILL BE HELD FOR 5 DAYS BEFORE ISSUING A FINAL NEGATIVE REPORT Performed at [facility]    Report Status PENDING  Incomplete  Malaria smear     Status: None (Preliminary result)   Collection Time: 09/28/14 12:47 AM  Result Value Ref Range Status   Specimen Description BLOOD  Final   Special Requests NONE  Final   Malaria Prep   Final    PLASMODIUM FALCIPARUM 15% PARASITEMIA REVIEWED BY PATHOLOGIST DR Ezequiel Ganser WINTER 09/28/14 CRITICAL RESULT CALLED TO, READ BACK BY AND VERIFIED WITH: ELLA BETHEL 09/28/14 325PM BY SUMMR DEMOGRAPHIC UPDATE OCCURRED ON 04/28 AT 1917, QA FLAGS AND RANGES MAY NO  LONGER BE VALID Performed at [facility]    Report  Status PENDING  Incomplete  MRSA PCR Screening     Status: None   Collection Time: 09/28/14  3:45 AM  Result Value Ref Range Status   MRSA by PCR NEGATIVE NEGATIVE Final    Comment:        The GeneXpert MRSA Assay (FDA approved for NASAL specimens only), is one component of a comprehensive MRSA colonization surveillance program. It is not intended to diagnose MRSA infection nor to guide or monitor treatment for MRSA infections.   Clostridium Difficile by PCR     Status: None   Collection Time: 09/29/14  4:18 AM  Result Value Ref Range Status   C difficile by pcr NEGATIVE NEGATIVE Final    Studies/Results: Dg Chest 2 View  09/27/2014   CLINICAL DATA:  Fever, cough, bodyaches, vomiting. diarrhea, weakness x 5 days. Pt recently traveled to/from [place]. HX:htn,nonsmoker  EXAM: CHEST  2 VIEW  COMPARISON:   None.  FINDINGS: Normal heart, mediastinum and hila. Clear lungs. No pleural effusion or pneumothorax.  Bony thorax is intact.  IMPRESSION: No active cardiopulmonary disease.   Electronically Signed   By: Lajean Manes M.D.   On: 09/27/2014 19:43   Dg Chest Port 1 View  09/28/2014   CLINICAL DATA:  Central line placement.  Initial encounter.  EXAM: PORTABLE CHEST - 1 VIEW  COMPARISON:  None.  FINDINGS: The patient's right IJ line is noted ending about the cavoatrial junction.  The lungs are well-aerated and clear. There is no evidence of focal opacification, pleural effusion or pneumothorax.  The cardiomediastinal silhouette is mildly enlarged. No acute osseous abnormalities are seen.  IMPRESSION: 1. Right IJ line noted ending about the cavoatrial junction. 2. Mild cardiomegaly; lungs remain grossly clear.   Electronically Signed   By: Garald Balding M.D.   On: 09/28/2014 03:58    Assessment/Plan:  1) falciparum malaria - high parasitemia initially, I estimate it was over 20%, initial smear here was 15%.  Improving with IV therapy.   I will change her to Co-artem Ok from ID to move out of ICU, would keep him on tele for 24 hours as quinidine wears off  Scharlene Gloss, [place] [facility] for Infectious Disease [email or web address] [phone] pager   [phone] cell 09/29/2014, 11:09 AM

## 2014-09-29 NOTE — Progress Notes (Signed)
NURSING PROGRESS NOTE  [name] [id number] Transfer Data: 09/29/2014 3:49 PM Attending Provider: Brand Males, MD PCP:No primary care provider on file. Code Status: Full  [name] is a 64 y.o. female patient transferred from 2 heart -No acute distress noted.  -No complaints of shortness of breath.  -No complaints of chest pain.   Cardiac Monitoring: Box #14  in place.   Blood pressure 107/52, pulse 82, temperature 100.5 F (38.1 C), temperature source Oral, resp. rate 16, height 5\' 3"  (1.6 m), weight 84.006 kg (185 lb 3.2 oz), SpO2 93 %.   IV Fluids:  IV in place, occlusive dsg intact without redness, SL.  Allergies:  Review of patient's allergies indicates no known allergies.  Past Medical History:   has a past medical history of Hypertension.  Past Surgical History:   has past surgical history that includes Cesarean section.  Social History:   reports that she has never smoked. She does not have any smokeless tobacco history on file. She reports that she does not drink alcohol.  Skin: Intact  Patient/Family orientated to room. Information packet given to patient/family. Admission inpatient armband information verified with patient/family to include name and date of birth and placed on patient arm. Side rails up x 2, fall assessment and education completed with patient/family. Patient/family able to verbalize understanding of risk associated with falls and verbalized understanding to call for assistance before getting out of bed. Call light within reach. Patient/family able to voice and demonstrate understanding of unit orientation instructions.    Will continue to evaluate and treat per MD orders.

## 2014-09-29 NOTE — Progress Notes (Signed)
[facility] Progress Note Patient Name: [name] DOB: 1950/07/11 MRN: [id number]   Date of Service  09/29/2014  HPI/Events of Note  Multiple issues: 1. K+ = 3.0 and Creatinine = 0.72 and 2. Patient c/o nausea.   eICU Interventions  Will order: 1. Replete K+. 2. Zofran 4 mg IV Q 8 hours PRN nausea or vomiting.      Intervention Category Intermediate Interventions: Electrolyte abnormality - evaluation and management Minor Interventions: Routine modifications to care plan (e.g. PRN medications for pain, fever)  Tenita Cue Eugene 09/29/2014, 3:39 AM

## 2014-09-29 NOTE — Progress Notes (Signed)
ANTIBIOTIC CONSULT NOTE - INITIAL  Pharmacy Consult for Co-Artem Indication: Malaria  No Known Allergies  Patient Measurements: Height: 5\' 3"  (160 cm) Weight: 172 lb 9.9 oz (78.3 kg) IBW/kg (Calculated) : 52.4 Adjusted Body Weight:   Vital Signs: Temp: 99.1 F (37.3 C) (04/29 0800) Temp Source: Oral (04/29 0800) BP: 113/54 mmHg (04/29 1000) Pulse Rate: 82 (04/29 1000) Intake/Output from previous day: 04/28 0701 - 04/29 0700 In: 4379.5 [P.O.:120; I.V.:2859.5; IV Piggyback:1400] Out: 1150 [Urine:1150] Intake/Output from this shift: Total I/O In: 886.6 [I.V.:375; IV Piggyback:511.6] Out: -   Labs:  Recent Labs  09/27/14 1845 09/28/14 0047 09/28/14 1030 09/28/14 1425 09/29/14 0010  WBC 9.5  --   --  9.3 8.7  HGB 12.5  --   --  9.8* 8.4*  PLT 18*  --   --  49* 38*  CREATININE 1.28* 0.94 0.73  --  0.72   Estimated Creatinine Clearance: 72.3 mL/min (by C-G formula based on Cr of 0.72). No results for input(s): VANCOTROUGH, VANCOPEAK, VANCORANDOM, GENTTROUGH, GENTPEAK, GENTRANDOM, TOBRATROUGH, TOBRAPEAK, TOBRARND, AMIKACINPEAK, AMIKACINTROU, AMIKACIN in the last 72 hours.   Microbiology: Recent Results (from the past 720 hour(s))  Malaria smear     Status: None (Preliminary result)   Collection Time: 09/27/14  6:45 PM  Result Value Ref Range Status   Specimen Description BLOOD  Final   Special Requests NONE  Final   Malaria Prep   Final    PLASMODIUM FALCIPARUM 15% PARASITEMIA REVIEWED BY PATHOLOGIST DR Ezequiel Ganser WINTER 09/28/14 CRITICAL RESULT CALLED TO, READ BACK BY AND VERIFIED WITH: ELLA BETHEL 09/28/14 325PM BY SUMMR Performed at [facility]    Report Status PENDING  Incomplete  Blood culture (routine x 2)     Status: None (Preliminary result)   Collection Time: 09/27/14  6:45 PM  Result Value Ref Range Status   Specimen Description BLOOD LEFT AC  Final   Special Requests BOTTLES DRAWN AEROBIC AND ANAEROBIC 5CC EACH  Final   Culture   Final   BLOOD CULTURE RECEIVED NO GROWTH TO DATE CULTURE WILL BE HELD FOR 5 DAYS BEFORE ISSUING A FINAL NEGATIVE REPORT Performed at [facility]    Report Status PENDING  Incomplete  Blood culture (routine x 2)     Status: None (Preliminary result)   Collection Time: 09/27/14  7:50 PM  Result Value Ref Range Status   Specimen Description BLOOD RIGHT AC  Final   Special Requests BOTTLES DRAWN AEROBIC AND ANAEROBIC 10CC EACH  Final   Culture   Final           BLOOD CULTURE RECEIVED NO GROWTH TO DATE CULTURE WILL BE HELD FOR 5 DAYS BEFORE ISSUING A FINAL NEGATIVE REPORT Performed at [facility]    Report Status PENDING  Incomplete  Malaria smear     Status: None (Preliminary result)   Collection Time: 09/28/14 12:47 AM  Result Value Ref Range Status   Specimen Description BLOOD  Final   Special Requests NONE  Final   Malaria Prep   Final    PLASMODIUM FALCIPARUM 15% PARASITEMIA REVIEWED BY PATHOLOGIST DR Ezequiel Ganser WINTER 09/28/14 CRITICAL RESULT CALLED TO, READ BACK BY AND VERIFIED WITH: ELLA BETHEL 09/28/14 325PM BY SUMMR DEMOGRAPHIC UPDATE OCCURRED ON 04/28 AT 1917, QA FLAGS AND RANGES MAY NO  LONGER BE VALID Performed at [facility]    Report Status PENDING  Incomplete  MRSA PCR Screening     Status: None   Collection Time: 09/28/14  3:45 AM  Result Value Ref Range Status   MRSA by PCR NEGATIVE NEGATIVE Final    Comment:        The GeneXpert MRSA Assay (FDA approved for NASAL specimens only), is one component of a comprehensive MRSA colonization surveillance program. It is not intended to diagnose MRSA infection nor to guide or monitor treatment for MRSA infections.   Clostridium Difficile by PCR     Status: None   Collection Time: 09/29/14  4:18 AM  Result Value Ref Range Status   C difficile by pcr NEGATIVE NEGATIVE Final    Medical History: Past Medical History  Diagnosis Date  . Hypertension     Medications:  Scheduled:  . insulin aspart  0-15  Units Subcutaneous TID WC   Infusions:  . sodium chloride 125 mL/hr at 09/29/14 0700  . norepinephrine (LEVOPHED) Adult infusion Stopped (09/29/14 0006)   Assessment: 64 yo who was admitted for malaria. She was on quinidine but now being transition to Co-artem PO. Quinidine has a half life of 6-8 hrs so ID will keep tele for 24 hrs.   Plan:   Coartem 4 tabs at 0 and 8 hr on first day Then 2 tabs bid on day 3 and [place], PharmD Pager: [phone] 09/29/2014 11:34 AM

## 2014-09-29 NOTE — Consult Note (Signed)
PULMONARY  / CRITICAL CARE MEDICINE  Name: Omar Orrego MRN: [id number] DOB: 1950/06/09    LOS: 2  REFERRING MD :  Dr. Baxter Flattery  CHIEF COMPLAINT:  Fever, myalgia, nonbloody diarrhea  BRIEF PATIENT DESCRIPTION: Ms. Venturi is a 64 year old female with hypertension from [place] who presented to [facility] with subacute onset fever/chills associated with weakness, diarrhea, dark urine concerning for malaria.  HISTORY OF PRESENT ILLNESS: Ms. Dukeman is a 64 year old female with hypertension from [place] who presented to [facility] with fever/chills associated with weakness, diarrhea, dark urine, headache localized to "behind her eyes" that began on 4/22 and 4/23. Before then, she first traveled to [place] from [place] to visit her family on 4/11 and was in her normal state of health though attributes going outside in cold weather without wearing a jacket as the cause of her current presentation. She also reports being evaluated by her regular doctor prior to leaving [place] without any abnormal findings. Of note, she also reports a past history of malaria, roughly 7-8 episodes since she was a child. Her fevers have been most notable over the last 3 days and occur during both daytime and nighttime. She denies any recent surgery, recent hospitalization, sick contacts, animal contact, recent travel to other countries though does live with other individuals at home and sells eggs for living. At outside hospital, she was found to have a fever 462.7 with systolic BP 03J-009F, and initial lab work was notable for Na 127, K 2.7, bilirubin 4, lactate 7.7, and peripheral smear notable for high parasite burden [unavailable for review]. ID was consulted and recommended management for severe malaria with doxycycline IV and quinidine IV, so she was transferred to [facility]. PCCM was consulted for closer monitoring.  PAST MEDICAL HISTORY :  Past Medical History  Diagnosis Date  . Hypertension    Past  Surgical History  Procedure Laterality Date  . Cesarean section     Prior to Admission medications   Medication Sig Start Date End Date Taking? Authorizing Provider  ibuprofen (ADVIL,MOTRIN) 400 MG tablet Take 400 mg by mouth every 6 (six) hours as needed.   Yes Historical Provider, MD   No Known Allergies  FAMILY HISTORY:  History reviewed. No pertinent family history. SOCIAL HISTORY:  reports that she has never smoked. She does not have any smokeless tobacco history on file. She reports that she does not drink alcohol. Her drug history is not on file.  REVIEW OF SYSTEMS:  As noted in the history of present illness   INTERVAL HISTORY:   VITAL SIGNS: Temp:  [98.2 F (36.8 C)-102.3 F (39.1 C)] 99.1 F (37.3 C) (04/29 0800) Pulse Rate:  [74-103] 82 (04/29 1000) Resp:  [14-30] 18 (04/29 1000) BP: (111-158)/(47-67) 113/54 mmHg (04/29 1000) SpO2:  [91 %-98 %] 95 % (04/29 1000) HEMODYNAMICS: CVP:  [3 mmHg-9 mmHg] 9 mmHg VENTILATOR SETTINGS:   INTAKE / OUTPUT: Intake/Output      04/28 0701 - 04/29 0700 04/29 0701 - 04/30 0700   P.O. 120    I.V. (mL/kg) 2859.5 (36.5) 375 (4.8)   IV Piggyback 1400 511.6   Total Intake(mL/kg) 4379.5 (55.9) 886.6 (11.3)   Urine (mL/kg/hr) 1150 (0.6)    Stool     Total Output 1150     Net +3229.5 +886.6        Urine Occurrence 2 x 1 x   Stool Occurrence 2 x 1 x    PHYSICAL EXAMINATION: General: Elderly African woman, resting in  bed, NAD Neuro: responds to questions appropriately; moving all extremities freely HEENT: PERRL, EOMI, mild scleral icterus, oropharynx clear with sublingual jaundice Cardiac: RRR, no rubs, murmurs or gallops Pulm: clear to auscultation bilaterally, no wheezes, rales, or rhonchi Abd: soft, nontender, nondistended, BS present Ext: warm and well perfused, no pedal edema  LABS: Cbc  Recent Labs Lab 09/27/14 1845 09/28/14 1425 09/29/14 0010  WBC 9.5 9.3 8.7  HGB 12.5 9.8* 8.4*  HCT 33.3* 26.9* 22.7*  PLT  18* 49* 38*   Chemistry  Recent Labs Lab 09/28/14 0047 09/28/14 1030 09/29/14 0010  NA 128* 131* 133*  K 3.5 3.0* 3.0*  CL 94* 98 101  CO2 24 23 25   BUN 17 8 8   CREATININE 0.94 0.73 0.72  CALCIUM 7.0* 7.4* 7.5*  GLUCOSE 340* 291* 228*   Liver fxn  Recent Labs Lab 09/28/14 0047 09/28/14 1030 09/29/14 0010  AST 51* 42* 42*  ALT 24 22 23   ALKPHOS 80 76 73  BILITOT 3.6* 4.7* 5.7*  PROT 5.2* 5.3* 5.6*  ALBUMIN 2.0* 2.0* 1.9*   coags  Recent Labs Lab 09/28/14 0047  INR 1.12   Sepsis markers  Recent Labs Lab 09/27/14 1855 09/29/14 0410  LATICACIDVEN 7.71* 1.8   DIAGNOSES: Active Problems:   Malaria   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 I reviewed CXR myself, some evidence of pulmonary vascular congestion PLAN:   Continue following Titrate O2 to off as able. KVO IVF  CARDIOVASCULAR  ASSESSMENT:  Septic shock: Likely secondary to malaria. Blood pressure minimally responsive to 3.5 L. Baseline EKG findings reassuring for no acute ischemia.  PLAN:  D/C central line. Check baseline EKG for QTC and every 8 hours thereafter while on treatment. D/C tele. D/C levophed.  RENAL  ASSESSMENT:   Acute kidney injury: Creatinine 1.3 on admission, baseline unknown, though improving. Hyponatremia: Likely hypovolemic in the setting of her acute illness.  Hypokalemia: Likely 2/2 diarrhea. Resolved with K supplementation.  PLAN:   KVO IVF Replace electrolytes as indicated. BMET in AM.  GASTROINTESTINAL  ASSESSMENT:   Abnormal LFTs: Likely secondary to malaria. Elevated AST, alkaline phosphatase Hyperbilirubinemia: Likely secondary to hemolysis in the setting of malaria.   PLAN:   Diet as ordered. PPI.  HEMATOLOGIC  ASSESSMENT:   Severe thrombocytopenia: Likely secondary to malaria. Platelets dropping.  PLAN:  Follow CBC SCDs for DVT prophylaxis D/C  pepcid (thrombocytopenia).  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   PLAN:   ANTIBIOTICS: Doxycycline 4/28>>4/29 Coartem 4/28>>>  CULTURES: Blood cultures 4/27>>NTD Urine cultures 4/27>>NTD Peripheral smear 4/28> falciparum malaria  Repeat blood smear here and every 12 hours for next 2-3 days Abx per ID.  ENDOCRINE  ASSESSMENT:   Hyperglycemia: Glucose 289 on admission.   PLAN:   Follow CMET to watch for hypoglycemia secondary to quinidine  NEUROLOGIC  ASSESSMENT:   No acute issues: Mental status intact. PLAN:   Continue assessing given the severity of her illness.  CLINICAL SUMMARY: Ms. Vanallen is a 64 year old female with hypertension from [place] who presented to [facility] with subacute onset fever/chills associated with weakness, diarrhea, dark urine concerning for falciparum malaria. Currently off pressors.  Will transfer to tele and to [facility] service, PCCM will sign off.  Discussed with [facility] MD and ID.  Wesam G.  Nelda Marseille, M.D. [facility] Pulmonary/Critical Care Medicine. Pager: [phone]. After hours pager: [phone].  09/29/2014, 10:51 AM

## 2014-09-29 NOTE — Progress Notes (Signed)
Patient temperature 102.7. Tylenol 650 mg PO prn given at 2049. Notified Rogue Bussing, NP. Will continue to monitor.

## 2014-09-29 NOTE — Progress Notes (Signed)
Inpatient Diabetes Program Recommendations  AACE/ADA: New Consensus Statement on Inpatient Glycemic Control (2013)  Target Ranges:  Prepandial:   less than 140 mg/dL      Peak postprandial:   less than 180 mg/dL (1-2 hours)      Critically ill patients:  140 - 180 mg/dL   Reason for assessment: elevated CBG  Diabetes history: None Outpatient Diabetes medications: none Current orders for Inpatient glycemic control:  Novolog correction 0-15 units q4h  Generally patients who have transitioned to a diet should be changed from Novolog correction q4h to tid and hs. This patient is eating poorly- please evaluated Novolog needs.   Gentry Fitz, RN, BA, MHA, CDE Diabetes Coordinator Inpatient Diabetes Program  [phone] (Team Pager) [phone] [facility]) 09/29/2014 10:12 AM

## 2014-09-30 DIAGNOSIS — D649 Anemia, unspecified: Secondary | ICD-10-CM

## 2014-09-30 DIAGNOSIS — N179 Acute kidney failure, unspecified: Secondary | ICD-10-CM

## 2014-09-30 DIAGNOSIS — B54 Unspecified malaria: Secondary | ICD-10-CM | POA: Insufficient documentation

## 2014-09-30 DIAGNOSIS — R509 Fever, unspecified: Secondary | ICD-10-CM

## 2014-09-30 DIAGNOSIS — D696 Thrombocytopenia, unspecified: Secondary | ICD-10-CM

## 2014-09-30 LAB — BASIC METABOLIC PANEL
Anion gap: 9 (ref 5–15)
BUN: 8 mg/dL (ref 6–23)
CO2: 24 mmol/L (ref 19–32)
CREATININE: 0.7 mg/dL (ref 0.50–1.10)
Calcium: 8.2 mg/dL — ABNORMAL LOW (ref 8.4–10.5)
Chloride: 99 mmol/L (ref 96–112)
GFR calc Af Amer: >90 mL/min (ref >90)
GFR calc non Af Amer: >90 mL/min (ref >90)
Glucose, Bld: 143 mg/dL — ABNORMAL HIGH (ref 70–99)
Potassium: 4.2 mmol/L (ref 3.5–5.1)
Sodium: 132 mmol/L — ABNORMAL LOW (ref 135–145)

## 2014-09-30 LAB — COMPREHENSIVE METABOLIC PANEL
ALBUMIN: 1.9 g/dL — AB (ref 3.5–5.2)
ALT: 21 U/L (ref 0–35)
ALT: 23 U/L (ref 0–35)
ANION GAP: 7 (ref 5–15)
ANION GAP: 9 (ref 5–15)
AST: 50 U/L — ABNORMAL HIGH (ref 0–37)
AST: 51 U/L — ABNORMAL HIGH (ref 0–37)
Albumin: 1.9 g/dL — ABNORMAL LOW (ref 3.5–5.2)
Alkaline Phosphatase: 60 U/L (ref 39–117)
Alkaline Phosphatase: 62 U/L (ref 39–117)
BILIRUBIN TOTAL: 6.2 mg/dL — AB (ref 0.3–1.2)
BUN: 7 mg/dL (ref 6–23)
BUN: 8 mg/dL (ref 6–23)
CALCIUM: 8 mg/dL — AB (ref 8.4–10.5)
CHLORIDE: 101 mmol/L (ref 96–112)
CO2: 24 mmol/L (ref 19–32)
CO2: 24 mmol/L (ref 19–32)
CREATININE: 0.73 mg/dL (ref 0.50–1.10)
Calcium: 8.5 mg/dL (ref 8.4–10.5)
Chloride: 98 mmol/L (ref 96–112)
Creatinine, Ser: 0.65 mg/dL (ref 0.50–1.10)
GFR calc Af Amer: >90 mL/min (ref >90)
GFR calc non Af Amer: 90 mL/min — ABNORMAL LOW (ref >90)
GFR calc non Af Amer: >90 mL/min (ref >90)
GLUCOSE: 148 mg/dL — AB (ref 70–99)
Glucose, Bld: 130 mg/dL — ABNORMAL HIGH (ref 70–99)
POTASSIUM: 3.9 mmol/L (ref 3.5–5.1)
POTASSIUM: 3.9 mmol/L (ref 3.5–5.1)
Sodium: 131 mmol/L — ABNORMAL LOW (ref 135–145)
Sodium: 132 mmol/L — ABNORMAL LOW (ref 135–145)
TOTAL PROTEIN: 5.9 g/dL — AB (ref 6.0–8.3)
TOTAL PROTEIN: 6.1 g/dL (ref 6.0–8.3)
Total Bilirubin: 7.8 mg/dL — ABNORMAL HIGH (ref 0.3–1.2)

## 2014-09-30 LAB — GLUCOSE, CAPILLARY
Glucose-Capillary: 140 mg/dL — ABNORMAL HIGH (ref 70–99)
Glucose-Capillary: 178 mg/dL — ABNORMAL HIGH (ref 70–99)
Glucose-Capillary: 108 mg/dL — ABNORMAL HIGH (ref 70–99)
Glucose-Capillary: 146 mg/dL — ABNORMAL HIGH (ref 70–99)

## 2014-09-30 LAB — MAGNESIUM: Magnesium: 1.7 mg/dL (ref 1.5–2.5)

## 2014-09-30 LAB — SAVE SMEAR

## 2014-09-30 LAB — URINE CULTURE: Colony Count: 25000

## 2014-09-30 LAB — CBC WITH DIFFERENTIAL/PLATELET
BASOS PCT: 1 % (ref 0–1)
Basophils Absolute: 0.1 10*3/uL (ref 0.0–0.1)
EOS PCT: 1 % (ref 0–5)
Eosinophils Absolute: 0.1 10*3/uL (ref 0.0–0.7)
HCT: 22.9 % — ABNORMAL LOW (ref 36.0–46.0)
HEMOGLOBIN: 8.4 g/dL — AB (ref 12.0–15.0)
LYMPHS ABS: 3.5 10*3/uL (ref 0.7–4.0)
Lymphocytes Relative: 33 % (ref 12–46)
MCH: 28.2 pg (ref 26.0–34.0)
MCHC: 36.7 g/dL — ABNORMAL HIGH (ref 30.0–36.0)
MCV: 76.8 fL — AB (ref 78.0–100.0)
Monocytes Absolute: 1.1 10*3/uL — ABNORMAL HIGH (ref 0.1–1.0)
Monocytes Relative: 10 % (ref 3–12)
NEUTROS PCT: 55 % (ref 43–77)
Neutro Abs: 5.8 10*3/uL (ref 1.7–7.7)
PLATELETS: 118 10*3/uL — AB (ref 150–400)
RBC: 2.98 MIL/uL — AB (ref 3.87–5.11)
RDW: 14.1 % (ref 11.5–15.5)
WBC: 10.6 10*3/uL — ABNORMAL HIGH (ref 4.0–10.5)

## 2014-09-30 LAB — CBC
HEMATOCRIT: 22 % — AB (ref 36.0–46.0)
HEMOGLOBIN: 8.1 g/dL — AB (ref 12.0–15.0)
MCH: 28.1 pg (ref 26.0–34.0)
MCHC: 36.8 g/dL — ABNORMAL HIGH (ref 30.0–36.0)
MCV: 76.4 fL — ABNORMAL LOW (ref 78.0–100.0)
Platelets: UNDETERMINED 10*3/uL (ref 150–400)
RBC: 2.88 MIL/uL — ABNORMAL LOW (ref 3.87–5.11)
RDW: 14.1 % (ref 11.5–15.5)
WBC: 9.6 10*3/uL (ref 4.0–10.5)

## 2014-09-30 LAB — PHOSPHORUS: PHOSPHORUS: 2.5 mg/dL (ref 2.3–4.6)

## 2014-09-30 MED ORDER — ARTEMETHER-LUMEFANTRINE 20-120 MG PO TABS
4.0000 | ORAL_TABLET | Freq: Two times a day (BID) | ORAL | Status: AC
  Administered 2014-09-30 (×2): 4 via ORAL
  Administered 2014-10-01: 3 via ORAL
  Administered 2014-10-01: 4 via ORAL
  Filled 2014-09-30 (×7): qty 4

## 2014-09-30 MED ORDER — ARTEMETHER-LUMEFANTRINE 20-120 MG PO TABS
4.0000 | ORAL_TABLET | Freq: Two times a day (BID) | ORAL | Status: DC
  Filled 2014-09-30 (×2): qty 4

## 2014-09-30 MED ORDER — DOXYCYCLINE HYCLATE 100 MG IV SOLR
100.0000 mg | Freq: Two times a day (BID) | INTRAVENOUS | Status: DC
  Filled 2014-09-30 (×2): qty 100

## 2014-09-30 NOTE — Progress Notes (Addendum)
PROGRESS NOTE  [name] WUX:[id number] DOB: 18-Dec-1950 DOA: 09/27/2014 PCP: No primary care provider on file.  [name] is a 64 year old female with hypertension from [place] who presented to [facility] with subacute onset fever/chills associated with weakness, diarrhea, dark urine concerning for malaria.   Assessment/Plan: Falciparum malaria: ID following,  -co-artem -remain on tele until tomm due to quinidine  septic shock due to malaria -resolved  AKI -resolved due to hypovolemia  Hyponatremia -due to dehydration, resolved  Hypokalemia -repleted  transaminitis -secondary to malaria  thombocyotpenia -due to malaria -trend -check in citrated tube  Anemia ?hemolysis vs volume    Code Status: full Family Communication: patient Disposition Plan: per ID   Consultants:  ID  PCCM  Procedures:      HPI/Subjective: Drinking orange juice No SOB, no CP  Objective: Filed Vitals:   09/30/14 0501  BP: 134/62  Pulse: 82  Temp: 100.3 F (37.9 C)  Resp: 17    Intake/Output Summary (Last 24 hours) at 09/30/14 0901 Last data filed at 09/30/14 0856  Gross per 24 hour  Intake 746.57 ml  Output    900 ml  Net -153.43 ml   Filed Weights   09/27/14 1744 09/27/14 2315 09/29/14 1518  Weight: 77.111 kg (170 lb) 78.3 kg (172 lb 9.9 oz) 84.006 kg (185 lb 3.2 oz)    Exam:   General:  A+Ox3, NAD  Cardiovascular: rrr  Respiratory: clear  Abdomen: +BS, soft  Musculoskeletal: no edema  Data Reviewed: Basic Metabolic Panel:  Recent Labs Lab 09/28/14 1030 09/29/14 0010 09/29/14 1215 09/29/14 2344 09/30/14 0554  NA 131* 133* 134* 132* 132*  K 3.0* 3.0* 3.3* 3.9 4.2  CL 98 101 103 101 99  CO2 23 25 24 24 24   GLUCOSE 291* 228* 166* 148* 143*  BUN 8 8 7 8 8   CREATININE 0.73 0.72 0.66 0.73 0.70  CALCIUM 7.4* 7.5* 7.7* 8.0* 8.2*  MG  --   --   --   --  1.7  PHOS  --   --   --   --  2.5   Liver Function Tests:  Recent  Labs Lab 09/28/14 0047 09/28/14 1030 09/29/14 0010 09/29/14 1215 09/29/14 2344  AST 51* 42* 42* 43* 50*  ALT 24 22 23 23 21   ALKPHOS 80 76 73 66 60  BILITOT 3.6* 4.7* 5.7* 7.1* 7.8*  PROT 5.2* 5.3* 5.6* 6.3 5.9*  ALBUMIN 2.0* 2.0* 1.9* 1.9* 1.9*   No results for input(s): LIPASE, AMYLASE in the last 168 hours. No results for input(s): AMMONIA in the last 168 hours. CBC:  Recent Labs Lab 09/27/14 1845 09/28/14 1425 09/29/14 0010 09/30/14 0554  WBC 9.5 9.3 8.7 9.6  HGB 12.5 9.8* 8.4* 8.1*  HCT 33.3* 26.9* 22.7* 22.0*  MCV 77.6* 78.9 77.7* 76.4*  PLT 18* 49* 38* PLATELET CLUMPS NOTED ON SMEAR, UNABLE TO ESTIMATE   Cardiac Enzymes: No results for input(s): CKTOTAL, CKMB, CKMBINDEX, TROPONINI in the last 168 hours. BNP (last 3 results) No results for input(s): BNP in the last 8760 hours.  ProBNP (last 3 results) No results for input(s): PROBNP in the last 8760 hours.  CBG:  Recent Labs Lab 09/29/14 0748 09/29/14 1331 09/29/14 1653 09/29/14 2122 09/30/14 0804  GLUCAP 133* 161* 141* 149* 140*    Recent Results (from the past 240 hour(s))  Malaria smear     Status: None   Collection Time: 09/27/14  6:45 PM  Result Value Ref Range Status  Specimen Description BLOOD  Final   Special Requests NONE  Final   Malaria Prep   Final    PLASMODIUM FALCIPARUM 15% PARASITEMIA REVIEWED BY PATHOLOGIST DR Ezequiel Ganser WINTER 09/28/14 CRITICAL RESULT CALLED TO, READ BACK BY AND VERIFIED WITH: ELLA BETHEL 09/28/14 325PM BY SUM[facility] FAXED TO [facility] 09/29/14 BY LYONK Performed at [facility]    Report Status 09/29/2014 FINAL  Final  Blood culture (routine x 2)     Status: None (Preliminary result)   Collection Time: 09/27/14  6:45 PM  Result Value Ref Range Status   Specimen Description BLOOD LEFT AC  Final   Special Requests BOTTLES DRAWN AEROBIC AND ANAEROBIC 5CC EACH  Final   Culture   Final           BLOOD CULTURE RECEIVED NO GROWTH TO DATE CULTURE WILL  BE HELD FOR 5 DAYS BEFORE ISSUING A FINAL NEGATIVE REPORT Performed at [facility]    Report Status PENDING  Incomplete  Blood culture (routine x 2)     Status: None (Preliminary result)   Collection Time: 09/27/14  7:50 PM  Result Value Ref Range Status   Specimen Description BLOOD RIGHT AC  Final   Special Requests BOTTLES DRAWN AEROBIC AND ANAEROBIC 10CC EACH  Final   Culture   Final           BLOOD CULTURE RECEIVED NO GROWTH TO DATE CULTURE WILL BE HELD FOR 5 DAYS BEFORE ISSUING A FINAL NEGATIVE REPORT Performed at [facility]    Report Status PENDING  Incomplete  Malaria smear     Status: None   Collection Time: 09/28/14 12:47 AM  Result Value Ref Range Status   Specimen Description BLOOD  Final   Special Requests NONE  Final   Malaria Prep   Final    PLASMODIUM FALCIPARUM 15% PARASITEMIA REVIEWED BY PATHOLOGIST DR Ezequiel Ganser WINTER 09/28/14 CRITICAL RESULT CALLED TO, READ BACK BY AND VERIFIED WITH: ELLA BETHEL 09/28/14 325PM BY SUM[facility] FAXED TO [facility] 09/29/14 BY LYONK Performed at [facility]    Report Status 09/29/2014 FINAL  Final  MRSA PCR Screening     Status: None   Collection Time: 09/28/14  3:45 AM  Result Value Ref Range Status   MRSA by PCR NEGATIVE NEGATIVE Final    Comment:        The GeneXpert MRSA Assay (FDA approved for NASAL specimens only), is one component of a comprehensive MRSA colonization surveillance program. It is not intended to diagnose MRSA infection nor to guide or monitor treatment for MRSA infections.   Urine culture     Status: None   Collection Time: 09/28/14 10:59 AM  Result Value Ref Range Status   Specimen Description URINE, CLEAN CATCH  Final   Special Requests NONE  Final   Colony Count   Final    25,000 COLONIES/ML Performed at [facility]    Culture   Final    Multiple bacterial morphotypes present, none predominant. Suggest appropriate recollection if clinically  indicated. Performed at [facility]    Report Status 09/30/2014 FINAL  Final  Clostridium Difficile by PCR     Status: None   Collection Time: 09/29/14  4:18 AM  Result Value Ref Range Status   C difficile by pcr NEGATIVE NEGATIVE Final     Studies: No results found.  Scheduled Meds: . doxycycline (VIBRAMYCIN) IV  100 mg Intravenous Q12H  . insulin aspart  0-15  Units Subcutaneous TID WC   Continuous Infusions:  Antibiotics Given (last 72 hours)    Date/Time Action Medication Dose Rate   09/27/14 2108 Given   doxycycline (VIBRA-TABS) tablet 100 mg 100 mg    09/28/14 0740 Given   doxycycline (VIBRAMYCIN) 100 mg in dextrose 5 % 250 mL IVPB 100 mg 125 mL/hr   09/28/14 1950 Given   doxycycline (VIBRAMYCIN) 100 mg in dextrose 5 % 250 mL IVPB 100 mg 125 mL/hr   09/29/14 0749 Given   doxycycline (VIBRAMYCIN) 100 mg in dextrose 5 % 250 mL IVPB 100 mg 125 mL/hr   09/29/14 1333 Given   artemether-lumefantrine (COARTEM) 20-120 MG tablet 4 tablet 4 tablet    09/29/14 2257 Given   artemether-lumefantrine (COARTEM) 20-120 MG tablet 4 tablet 4 tablet       Active Problems:   Malaria   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 If 7PM-7AM, please contact night-coverage at [email or web address], password [facility] 09/30/2014, 9:01 AM  LOS: 3 days

## 2014-09-30 NOTE — Progress Notes (Addendum)
[facility] for Infectious Disease  Date of Admission:  09/27/2014  Antibiotics: Doxy - 3 days stopped on 4/29 Quinidine gluc - stopped on 4/29 Coartem Day #2  Subjective: Still remains febrile, tmax of 102.7 last nite, she states that she is feeling much improved.  no headache, some diarrhea  Objective: Temp:  [98.7 F (37.1 C)-102.7 F (39.3 C)] 100.3 F (37.9 C) (04/30 0501) Pulse Rate:  [82-89] 82 (04/30 0501) Resp:  [16-28] 17 (04/30 0501) BP: (107-134)/(52-68) 134/62 mmHg (04/30 0501) SpO2:  [92 %-96 %] 92 % (04/30 0501) Weight:  [185 lb 3.2 oz (84.006 kg)] 185 lb 3.2 oz (84.006 kg) (04/29 1518)  General: awake, alert, nad Skin: no rashes Lungs: CTA B Cor: RRR Abdomen: soft, nt, nd Ext: no edema  Lab Results Lab Results  Component Value Date   WBC 9.6 09/30/2014   HGB 8.1* 09/30/2014   HCT 22.0* 09/30/2014   MCV 76.4* 09/30/2014   PLT PLATELET CLUMPS NOTED ON SMEAR, UNABLE TO ESTIMATE 09/30/2014    Lab Results  Component Value Date   CREATININE 0.70 09/30/2014   BUN 8 09/30/2014   NA 132* 09/30/2014   K 4.2 09/30/2014   CL 99 09/30/2014   CO2 24 09/30/2014    Lab Results  Component Value Date   ALT 21 09/29/2014   AST 50* 09/29/2014   ALKPHOS 60 09/29/2014   BILITOT 7.8* 09/29/2014      Microbiology: Recent Results (from the past 240 hour(s))  Malaria smear     Status: None   Collection Time: 09/27/14  6:45 PM  Result Value Ref Range Status   Specimen Description BLOOD  Final   Special Requests NONE  Final   Malaria Prep   Final    PLASMODIUM FALCIPARUM 15% PARASITEMIA REVIEWED BY PATHOLOGIST DR Ezequiel Ganser WINTER 09/28/14 CRITICAL RESULT CALLED TO, READ BACK BY AND VERIFIED WITH: ELLA BETHEL 09/28/14 325PM BY SUM[facility] FAXED TO [facility] 09/29/14 BY LYONK Performed at [facility]    Report Status 09/29/2014 FINAL  Final  Blood culture (routine x 2)     Status: None (Preliminary result)   Collection Time: 09/27/14  6:45  PM  Result Value Ref Range Status   Specimen Description BLOOD LEFT AC  Final   Special Requests BOTTLES DRAWN AEROBIC AND ANAEROBIC 5CC EACH  Final   Culture   Final           BLOOD CULTURE RECEIVED NO GROWTH TO DATE CULTURE WILL BE HELD FOR 5 DAYS BEFORE ISSUING A FINAL NEGATIVE REPORT Performed at [facility]    Report Status PENDING  Incomplete  Blood culture (routine x 2)     Status: None (Preliminary result)   Collection Time: 09/27/14  7:50 PM  Result Value Ref Range Status   Specimen Description BLOOD RIGHT AC  Final   Special Requests BOTTLES DRAWN AEROBIC AND ANAEROBIC 10CC EACH  Final   Culture   Final           BLOOD CULTURE RECEIVED NO GROWTH TO DATE CULTURE WILL BE HELD FOR 5 DAYS BEFORE ISSUING A FINAL NEGATIVE REPORT Performed at [facility]    Report Status PENDING  Incomplete  Malaria smear     Status: None   Collection Time: 09/28/14 12:47 AM  Result Value Ref Range Status   Specimen Description BLOOD  Final   Special Requests NONE  Final   Malaria Prep   Final    PLASMODIUM  FALCIPARUM 15% PARASITEMIA REVIEWED BY PATHOLOGIST DR Ezequiel Ganser WINTER 09/28/14 CRITICAL RESULT CALLED TO, READ BACK BY AND VERIFIED WITH: ELLA BETHEL 09/28/14 325PM BY SUMMR FAXED TO [facility] 09/29/14 BY LYONK Performed at [facility]    Report Status 09/29/2014 FINAL  Final  MRSA PCR Screening     Status: None   Collection Time: 09/28/14  3:45 AM  Result Value Ref Range Status   MRSA by PCR NEGATIVE NEGATIVE Final    Comment:        The GeneXpert MRSA Assay (FDA approved for NASAL specimens only), is one component of a comprehensive MRSA colonization surveillance program. It is not intended to diagnose MRSA infection nor to guide or monitor treatment for MRSA infections.   Urine culture     Status: None   Collection Time: 09/28/14 10:59 AM  Result Value Ref Range Status   Specimen Description URINE, CLEAN CATCH  Final   Special Requests  NONE  Final   Colony Count   Final    25,000 COLONIES/ML Performed at [facility]    Culture   Final    Multiple bacterial morphotypes present, none predominant. Suggest appropriate recollection if clinically indicated. Performed at [facility]    Report Status 09/30/2014 FINAL  Final  Clostridium Difficile by PCR     Status: None   Collection Time: 09/29/14  4:18 AM  Result Value Ref Range Status   C difficile by pcr NEGATIVE NEGATIVE Final    Studies/Results: No results found.  Assessment/Plan:  1) falciparum malaria - severe malaria based upon high parasetemia 15% and initial presentation.  Improving with IV of quinidine and doxycycline. She was changed to coartem on 4/29 continues to do well. We will continue with this regimen. Repeat blood smear today to see that she continues to trend down. Last smear on 4/29 still showed 15%. Would also recommend that she has repeat malarial smear tomorrow  2) anemia = likely due to hemolysis of infected red cells also corresponds to elevated bilirubin and increased AST. Recommend to continue to monitor. She had high burden of 15-20% thus may need transfusion if continues to trend down  3) fevers =due to malaria most likely.  4) hyperbilirubinemia = due to hemolytic anemia due to malaria  5) aki = resolved  6) thrombocytopenia = improved, but today's cbc showing clumping. Recommend repeat cbc in the am  Carlyle Basques, [facility] [place] [email or web address] [phone] pager   [phone] cell 09/30/2014, 1:34 PM

## 2014-10-01 LAB — GLUCOSE, CAPILLARY
GLUCOSE-CAPILLARY: 107 mg/dL — AB (ref 70–99)
GLUCOSE-CAPILLARY: 125 mg/dL — AB (ref 70–99)
GLUCOSE-CAPILLARY: 104 mg/dL — AB (ref 70–99)
Glucose-Capillary: 83 mg/dL (ref 70–99)

## 2014-10-01 NOTE — Progress Notes (Signed)
PROGRESS NOTE  Yalda Herd ZHG:[id number] DOB: [birth year] DOA: 09/27/2014 PCP: No primary care provider on file.  Ms. Hawbaker is a 64 year old female with hypertension from [place] who presented to [facility] with subacute onset fever/chills associated with weakness, diarrhea, dark urine concerning for malaria.   Assessment/Plan: Falciparum malaria:  ID following -co-artem -remain on tele due to quinidine- can be d/c'd in AM  septic shock due to malaria -resolved  AKI -resolved due to hypovolemia  Hyponatremia -due to dehydration- monitor  Hypokalemia -repleted  transaminitis -secondary to malaria  thombocyotpenia -due to malaria -improved  Anemia ?hemolysis vs volume    Code Status: full Family Communication: patient Disposition Plan: per ID   Consultants:  ID  PCCM  Procedures:      HPI/Subjective: Up brushing teeth asking about breakfast No SOB, no CP  Objective: Filed Vitals:   10/01/14 0546  BP: 134/60  Pulse: 72  Temp: 99.1 F (37.3 C)  Resp: 18    Intake/Output Summary (Last 24 hours) at 10/01/14 0825 Last data filed at 09/30/14 1232  Gross per 24 hour  Intake    240 ml  Output    500 ml  Net   -260 ml   Filed Weights   09/27/14 1744 09/27/14 2315 09/29/14 1518  Weight: 77.111 kg (170 lb) 78.3 kg (172 lb 9.9 oz) 84.006 kg (185 lb 3.2 oz)    Exam:   General:  A+Ox3, NAD  Cardiovascular: rrr  Respiratory: clear  Abdomen: +BS, soft  Musculoskeletal: no edema  Data Reviewed: Basic Metabolic Panel:  Recent Labs Lab 09/29/14 0010 09/29/14 1215 09/29/14 2344 09/30/14 0554 09/30/14 1520  NA 133* 134* 132* 132* 131*  K 3.0* 3.3* 3.9 4.2 3.9  CL 101 103 101 99 98  CO2 25 24 24 24 24   GLUCOSE 228* 166* 148* 143* 130*  BUN 8 7 8 8 7   CREATININE 0.72 0.66 0.73 0.70 0.65  CALCIUM 7.5* 7.7* 8.0* 8.2* 8.5  MG  --   --   --  1.7  --   PHOS  --   --   --  2.5  --    Liver Function Tests:  Recent  Labs Lab 09/28/14 1030 09/29/14 0010 09/29/14 1215 09/29/14 2344 09/30/14 1520  AST 42* 42* 43* 50* 51*  ALT 22 23 23 21 23   ALKPHOS 76 73 66 60 62  BILITOT 4.7* 5.7* 7.1* 7.8* 6.2*  PROT 5.3* 5.6* 6.3 5.9* 6.1  ALBUMIN 2.0* 1.9* 1.9* 1.9* 1.9*   No results for input(s): LIPASE, AMYLASE in the last 168 hours. No results for input(s): AMMONIA in the last 168 hours. CBC:  Recent Labs Lab 09/27/14 1845 09/28/14 1425 09/29/14 0010 09/30/14 0554 09/30/14 1520  WBC 9.5 9.3 8.7 9.6 10.6*  NEUTROABS  --   --   --   --  5.8  HGB 12.5 9.8* 8.4* 8.1* 8.4*  HCT 33.3* 26.9* 22.7* 22.0* 22.9*  MCV 77.6* 78.9 77.7* 76.4* 76.8*  PLT 18* 49* 38* PLATELET CLUMPS NOTED ON SMEAR, UNABLE TO ESTIMATE 118*   Cardiac Enzymes: No results for input(s): CKTOTAL, CKMB, CKMBINDEX, TROPONINI in the last 168 hours. BNP (last 3 results) No results for input(s): BNP in the last 8760 hours.  ProBNP (last 3 results) No results for input(s): PROBNP in the last 8760 hours.  CBG:  Recent Labs Lab 09/29/14 2122 09/30/14 0804 09/30/14 1207 09/30/14 1709 09/30/14 2129  GLUCAP 149* 140* 178* 108* 146*    Recent Results (from  the past 240 hour(s))  Malaria smear     Status: None   Collection Time: 09/27/14  6:45 PM  Result Value Ref Range Status   Specimen Description BLOOD  Final   Special Requests NONE  Final   Malaria Prep   Final    PLASMODIUM FALCIPARUM 15% PARASITEMIA REVIEWED BY PATHOLOGIST DR Ezequiel Ganser WINTER 09/28/14 CRITICAL RESULT CALLED TO, READ BACK BY AND VERIFIED WITH: ELLA BETHEL 09/28/14 325PM BY [facility] FAXED TO [facility] 09/29/14 BY LYONK Performed at [facility]    Report Status 09/29/2014 FINAL  Final  Blood culture (routine x 2)     Status: None (Preliminary result)   Collection Time: 09/27/14  6:45 PM  Result Value Ref Range Status   Specimen Description BLOOD LEFT AC  Final   Special Requests BOTTLES DRAWN AEROBIC AND ANAEROBIC 5CC EACH  Final    Culture   Final           BLOOD CULTURE RECEIVED NO GROWTH TO DATE CULTURE WILL BE HELD FOR 5 DAYS BEFORE ISSUING A FINAL NEGATIVE REPORT Performed at [facility]    Report Status PENDING  Incomplete  Blood culture (routine x 2)     Status: None (Preliminary result)   Collection Time: 09/27/14  7:50 PM  Result Value Ref Range Status   Specimen Description BLOOD RIGHT AC  Final   Special Requests BOTTLES DRAWN AEROBIC AND ANAEROBIC 10CC EACH  Final   Culture   Final           BLOOD CULTURE RECEIVED NO GROWTH TO DATE CULTURE WILL BE HELD FOR 5 DAYS BEFORE ISSUING A FINAL NEGATIVE REPORT Performed at [facility]    Report Status PENDING  Incomplete  Malaria smear     Status: None   Collection Time: 09/28/14 12:47 AM  Result Value Ref Range Status   Specimen Description BLOOD  Final   Special Requests NONE  Final   Malaria Prep   Final    PLASMODIUM FALCIPARUM 15% PARASITEMIA REVIEWED BY PATHOLOGIST DR Ezequiel Ganser WINTER 09/28/14 CRITICAL RESULT CALLED TO, READ BACK BY AND VERIFIED WITH: ELLA BETHEL 09/28/14 325PM BY [facility] FAXED TO [facility] 09/29/14 BY LYONK Performed at [facility]    Report Status 09/29/2014 FINAL  Final  MRSA PCR Screening     Status: None   Collection Time: 09/28/14  3:45 AM  Result Value Ref Range Status   MRSA by PCR NEGATIVE NEGATIVE Final    Comment:        The GeneXpert MRSA Assay (FDA approved for NASAL specimens only), is one component of a comprehensive MRSA colonization surveillance program. It is not intended to diagnose MRSA infection nor to guide or monitor treatment for MRSA infections.   Urine culture     Status: None   Collection Time: 09/28/14 10:59 AM  Result Value Ref Range Status   Specimen Description URINE, CLEAN CATCH  Final   Special Requests NONE  Final   Colony Count   Final    25,000 COLONIES/ML Performed at [facility]    Culture   Final    Multiple bacterial morphotypes  present, none predominant. Suggest appropriate recollection if clinically indicated. Performed at [facility]    Report Status 09/30/2014 FINAL  Final  Clostridium Difficile by PCR     Status: None   Collection Time: 09/29/14  4:18 AM  Result Value Ref Range Status   C difficile by pcr  NEGATIVE NEGATIVE Final  Malaria smear     Status: None (Preliminary result)   Collection Time: 09/30/14  1:34 PM  Result Value Ref Range Status   Specimen Description BLOOD  Final   Special Requests Normal  Final   Malaria Prep   Final    PLASMODIUM FALCIPARUM <0.1% PARASITEMIA CRITICAL RESULT CALLED TO, READ BACK BY AND VERIFIED WITH: ADRIENNE W/5W 10/01/14 730AM BY SUMMR Performed at [facility]    Report Status PENDING  Incomplete     Studies: No results found.  Scheduled Meds: . artemether-lumefantrine  4 tablet Oral BID  . insulin aspart  0-15 Units Subcutaneous TID WC   Continuous Infusions:  Antibiotics Given (last 72 hours)    Date/Time Action Medication Dose Rate   09/28/14 1950 Given   doxycycline (VIBRAMYCIN) 100 mg in dextrose 5 % 250 mL IVPB 100 mg 125 mL/hr   09/29/14 0749 Given   doxycycline (VIBRAMYCIN) 100 mg in dextrose 5 % 250 mL IVPB 100 mg 125 mL/hr   09/29/14 1333 Given   artemether-lumefantrine (COARTEM) 20-120 MG tablet 4 tablet 4 tablet    09/29/14 2257 Given   artemether-lumefantrine (COARTEM) 20-120 MG tablet 4 tablet 4 tablet    09/30/14 1040 Given   artemether-lumefantrine (COARTEM) 20-120 MG tablet 4 tablet 4 tablet    09/30/14 2304 Given   artemether-lumefantrine (COARTEM) 20-120 MG tablet 4 tablet 4 tablet       Active Problems:   Malaria   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 If 7PM-7AM, please contact night-coverage at [email or web address], password [facility] 10/01/2014, 8:25 AM  LOS: 4 days

## 2014-10-02 DIAGNOSIS — E876 Hypokalemia: Secondary | ICD-10-CM | POA: Insufficient documentation

## 2014-10-02 LAB — MALARIA SMEAR: Special Requests: NORMAL

## 2014-10-02 LAB — GLUCOSE, CAPILLARY
GLUCOSE-CAPILLARY: 110 mg/dL — AB (ref 70–99)
Glucose-Capillary: 160 mg/dL — ABNORMAL HIGH (ref 70–99)

## 2014-10-02 NOTE — Progress Notes (Signed)
INFECTIOUS DISEASE PROGRESS NOTE  ID: [name] is a 64 y.o. 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   Thrombocytopenia   Anemia   AKI (acute kidney injury)   Malaria fever  Subjective: Denies further fever  Abtx:  Anti-infectives    Start     Dose/Rate Route Frequency Ordered Stop   09/30/14 1000  artemether-lumefantrine (COARTEM) 20-120 MG tablet 4 tablet  Status:  Discontinued     4 tablet Oral 2 times daily 09/29/14 1140 09/30/14 0843   09/30/14 1000  artemether-lumefantrine (COARTEM) 20-120 MG tablet 4 tablet  Status:  Discontinued     4 tablet Oral 2 times daily 09/30/14 0903 09/30/14 0912   09/30/14 1000  artemether-lumefantrine (COARTEM) 20-120 MG tablet 4 tablet    Comments:  Located in the blue bin on the main pharmacy counter-please send secure, we only have enough for this patient's treatment regimen   4 tablet Oral 2 times daily 09/30/14 0912 10/01/14 2235   09/30/14 0845  doxycycline (VIBRAMYCIN) 100 mg in dextrose 5 % 250 mL IVPB  Status:  Discontinued     100 mg 125 mL/hr over 120 Minutes Intravenous Every 12 hours 09/30/14 0843 09/30/14 0903   09/29/14 2100  artemether-lumefantrine (COARTEM) 20-120 MG tablet 4 tablet     4 tablet Oral  Once 09/29/14 1140 09/29/14 2257   09/29/14 1200  artemether-lumefantrine (COARTEM) 20-120 MG tablet 4 tablet     4 tablet Oral  Once 09/29/14 1140 09/29/14 1333   09/28/14 0800  doxycycline (VIBRAMYCIN) 100 mg in dextrose 5 % 250 mL IVPB  Status:  Discontinued     100 mg 125 mL/hr over 120 Minutes Intravenous Every 12 hours 09/27/14 2332 09/29/14 1116   09/27/14 2115  doxycycline (VIBRA-TABS) tablet 100 mg     100 mg Oral  Once 09/27/14 2106 09/27/14 2108   09/27/14 2011  doxycycline (VIBRAMYCIN) 100 mg in dextrose 5 % 250 mL IVPB  Status:  Discontinued     100 mg 125 mL/hr over 120 Minutes Intravenous Every 12 hours 09/27/14 2011 09/27/14 2106      Medications:  Scheduled: .  insulin aspart  0-15 Units Subcutaneous TID WC    Objective: Vital signs in last 24 hours: Temp:  [98.3 F (36.8 C)-99 F (37.2 C)] 99 F (37.2 C) (05/02 0531) Pulse Rate:  [64] 64 (05/02 0531) Resp:  [16-18] 16 (05/02 0531) BP: (132-151)/(69-90) 151/75 mmHg (05/02 0531) SpO2:  [95 %-100 %] 95 % (05/02 0531)   General appearance: alert, cooperative and no distress Resp: clear to auscultation bilaterally Cardio: regular rate and rhythm GI: normal findings: bowel sounds normal, soft, non-tender and spleen non-palpable  Lab Results  Recent Labs  09/30/14 0554 09/30/14 1520  WBC 9.6 10.6*  HGB 8.1* 8.4*  HCT 22.0* 22.9*  NA 132* 131*  K 4.2 3.9  CL 99 98  CO2 24 24  BUN 8 7  CREATININE 0.70 0.65   Liver Panel  Recent Labs  09/29/14 2344 09/30/14 1520  PROT 5.9* 6.1  ALBUMIN 1.9* 1.9*  AST 50* 51*  ALT 21 23  ALKPHOS 60 62  BILITOT 7.8* 6.2*   Sedimentation Rate No results for input(s): ESRSEDRATE in the last 72 hours. C-Reactive Protein No results for input(s): CRP in the last 72 hours.  Microbiology: Recent Results (from the past 240 hour(s))  Malaria smear     Status: None   Collection Time: 09/27/14  6:45  PM  Result Value Ref Range Status   Specimen Description BLOOD  Final   Special Requests NONE  Final   Malaria Prep   Final    PLASMODIUM FALCIPARUM 15% PARASITEMIA REVIEWED BY PATHOLOGIST DR Ezequiel Ganser WINTER 09/28/14 CRITICAL RESULT CALLED TO, READ BACK BY AND VERIFIED WITH: ELLA BETHEL 09/28/14 325PM BY SUM[facility] FAXED TO [facility] 09/29/14 BY LYONK Performed at [facility]    Report Status 09/29/2014 FINAL  Final  Blood culture (routine x 2)     Status: None (Preliminary result)   Collection Time: 09/27/14  6:45 PM  Result Value Ref Range Status   Specimen Description BLOOD LEFT AC  Final   Special Requests BOTTLES DRAWN AEROBIC AND ANAEROBIC 5CC EACH  Final   Culture   Final           BLOOD CULTURE RECEIVED NO GROWTH TO DATE  CULTURE WILL BE HELD FOR 5 DAYS BEFORE ISSUING A FINAL NEGATIVE REPORT Performed at [facility]    Report Status PENDING  Incomplete  Blood culture (routine x 2)     Status: None (Preliminary result)   Collection Time: 09/27/14  7:50 PM  Result Value Ref Range Status   Specimen Description BLOOD RIGHT AC  Final   Special Requests BOTTLES DRAWN AEROBIC AND ANAEROBIC 10CC EACH  Final   Culture   Final           BLOOD CULTURE RECEIVED NO GROWTH TO DATE CULTURE WILL BE HELD FOR 5 DAYS BEFORE ISSUING A FINAL NEGATIVE REPORT Performed at [facility]    Report Status PENDING  Incomplete  Malaria smear     Status: None   Collection Time: 09/28/14 12:47 AM  Result Value Ref Range Status   Specimen Description BLOOD  Final   Special Requests NONE  Final   Malaria Prep   Final    PLASMODIUM FALCIPARUM 15% PARASITEMIA REVIEWED BY PATHOLOGIST DR Ezequiel Ganser WINTER 09/28/14 CRITICAL RESULT CALLED TO, READ BACK BY AND VERIFIED WITH: ELLA BETHEL 09/28/14 325PM BY SUM[facility] FAXED TO [facility] 09/29/14 BY LYONK Performed at [facility]    Report Status 09/29/2014 FINAL  Final  MRSA PCR Screening     Status: None   Collection Time: 09/28/14  3:45 AM  Result Value Ref Range Status   MRSA by PCR NEGATIVE NEGATIVE Final    Comment:        The GeneXpert MRSA Assay (FDA approved for NASAL specimens only), is one component of a comprehensive MRSA colonization surveillance program. It is not intended to diagnose MRSA infection nor to guide or monitor treatment for MRSA infections.   Urine culture     Status: None   Collection Time: 09/28/14 10:59 AM  Result Value Ref Range Status   Specimen Description URINE, CLEAN CATCH  Final   Special Requests NONE  Final   Colony Count   Final    25,000 COLONIES/ML Performed at [facility]    Culture   Final    Multiple bacterial morphotypes present, none predominant. Suggest appropriate recollection if clinically  indicated. Performed at [facility]    Report Status 09/30/2014 FINAL  Final  Clostridium Difficile by PCR     Status: None   Collection Time: 09/29/14  4:18 AM  Result Value Ref Range Status   C difficile by pcr NEGATIVE NEGATIVE Final  Malaria smear     Status: None   Collection Time: 09/30/14  1:34 PM  Result Value Ref Range Status   Specimen Description BLOOD  Final   Special Requests Normal  Final   Malaria Prep   Final    PLASMODIUM FALCIPARUM <0.1% PARASITEMIA CRITICAL RESULT CALLED TO, READ BACK BY AND VERIFIED WITH: ADRIENNE W/5W 10/01/14 730AM BY SUM[facility] FAXED TO [facility] BY PEAKY Performed at [facility]    Report Status 10/02/2014 FINAL  Final    Studies/Results: No results found.   Assessment/Plan: Malaria  She has completed her co-artem coarse (d/i pharm).  She can f/u with PCP.  She states she has had malaria multiple times, would consider repeat Malarai smear at f/u.  She is HIV (-)  Total days of antibiotics: completed         Bobby Rumpf Infectious Diseases (pager) [phone] [email or web address] 10/02/2014, 1:30 PM  LOS: 5 days

## 2014-10-02 NOTE — Progress Notes (Addendum)
NURSING PROGRESS NOTE  Krystale Rinkenberger [id number] Discharge Data: 10/02/2014 3:50 PM Attending Provider: Delfina Redwood, MD PCP:No primary care provider on file.     Aretta Nip to be D/C'd Home per MD order.  Discussed with the Si Raider, patients daughter, per pt request the After Visit Summary and all questions fully answered. All IV's discontinued with no bleeding noted. All belongings returned to patient for patient to take home.   Last Vital Signs:  Blood pressure 146/67, pulse 64, temperature 98.2 F (36.8 C), temperature source Oral, resp. rate 18, height 5\' 3"  (1.6 m), weight 84.006 kg (185 lb 3.2 oz), SpO2 98 %.  Discharge Medication List   Medication List    TAKE these medications        ibuprofen 400 MG tablet  Commonly known as:  ADVIL,MOTRIN  Take 400 mg by mouth every 6 (six) hours as needed.     NIFEdipine 30 MG 24 hr tablet  Commonly known as:  PROCARDIA-XL/ADALAT-CC/NIFEDICAL-XL  Take 30 mg by mouth daily.         Wallie Renshaw, RN

## 2014-10-02 NOTE — Discharge Summary (Signed)
Physician Discharge Summary  [name] GNF:[id number] DOB: Dec 10, 1950 DOA: 09/27/2014  PCP: No primary care provider on file.  Admit date: 09/27/2014 Discharge date: 10/02/2014  Time spent: greater than 30 minutes  Recommendations for Outpatient Follow-up:  1.   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 Confirmed on smear. Initially admitted to critical care/icu  Falciparum malaria:  ID following Completed course of doxycycline, coartem, quinine. Malaria percentage by smear much improved at discharge.  septic shock due to malaria -resolved  AKI -resolved due to hypovolemia  Hyponatremia -due to dehydration-  Hypokalemia -repleted  transaminitis -secondary to malaria, improved  thombocyotpenia -due to malaria -improved  Anemia ?hemolysis and volume  Consultants:  ID  PCCM   Procedures:  none  Discharge Exam: Filed Vitals:   10/02/14 1431  BP: 146/67  Pulse:   Temp: 98.2 F (36.8 C)  Resp: 18    General: comfortable Cardiovascular: RRR Respiratory: CTA  Discharge Instructions   Discharge Instructions    Activity as tolerated - No restrictions    Complete by:  As directed      Diet - low sodium heart healthy    Complete by:  As directed           Current Discharge Medication List    CONTINUE these medications which have NOT CHANGED   Details  ibuprofen (ADVIL,MOTRIN) 400 MG tablet Take 400 mg by  mouth every 6 (six) hours as needed.    NIFEdipine (PROCARDIA-XL/ADALAT-CC/NIFEDICAL-XL) 30 MG 24 hr tablet Take 30 mg by mouth daily.       No Known Allergies Follow-up Information    Follow up with [facility]     On 10/06/2014.   Why:  10 am for hospital follow up   Contact information:   [address] [place] [address] [phone]       The results of significant diagnostics from this hospitalization (including imaging, microbiology, ancillary and laboratory) are listed below for reference.    Significant Diagnostic Studies: Dg Chest 2 View  09/27/2014   CLINICAL DATA:  Fever, cough, bodyaches, vomiting. diarrhea, weakness x 5 days. Pt recently traveled to/from [place]. HX:htn,nonsmoker  EXAM: CHEST  2 VIEW  COMPARISON:  None.  FINDINGS: Normal heart, mediastinum and hila. Clear lungs. No pleural effusion or pneumothorax.  Bony thorax is intact.  IMPRESSION: No active cardiopulmonary disease.   Electronically Signed   By: Lajean Manes M.D.   On: 09/27/2014 19:43   Dg Chest Port 1 View  09/28/2014   CLINICAL DATA:  Central line placement.  Initial encounter.  EXAM: PORTABLE CHEST - 1 VIEW  COMPARISON:  None.  FINDINGS: The patient's right IJ line is noted ending about the cavoatrial junction.  The lungs are well-aerated and clear. There is no evidence of focal opacification, pleural effusion or pneumothorax.  The cardiomediastinal silhouette is mildly enlarged. No acute osseous abnormalities are seen.  IMPRESSION: 1. Right IJ line noted  ending about the cavoatrial junction. 2. Mild cardiomegaly; lungs remain grossly clear.   Electronically Signed   By: Garald Balding M.D.   On: 09/28/2014 03:58    Microbiology: Recent Results (from the past 240 hour(s))  Malaria smear     Status: None   Collection Time: 09/27/14  6:45 PM  Result Value Ref Range Status   Specimen Description BLOOD  Final   Special Requests NONE  Final    Malaria Prep   Final    PLASMODIUM FALCIPARUM 15% PARASITEMIA REVIEWED BY PATHOLOGIST DR Ezequiel Ganser WINTER 09/28/14 CRITICAL RESULT CALLED TO, READ BACK BY AND VERIFIED WITH: ELLA BETHEL 09/28/14 325PM BY SUM[facility] FAXED TO [facility] 09/29/14 BY LYONK Performed at [facility]    Report Status 09/29/2014 FINAL  Final  Blood culture (routine x 2)     Status: None (Preliminary result)   Collection Time: 09/27/14  6:45 PM  Result Value Ref Range Status   Specimen Description BLOOD LEFT AC  Final   Special Requests BOTTLES DRAWN AEROBIC AND ANAEROBIC 5CC EACH  Final   Culture   Final           BLOOD CULTURE RECEIVED NO GROWTH TO DATE CULTURE WILL BE HELD FOR 5 DAYS BEFORE ISSUING A FINAL NEGATIVE REPORT Performed at [facility]    Report Status PENDING  Incomplete  Blood culture (routine x 2)     Status: None (Preliminary result)   Collection Time: 09/27/14  7:50 PM  Result Value Ref Range Status   Specimen Description BLOOD RIGHT AC  Final   Special Requests BOTTLES DRAWN AEROBIC AND ANAEROBIC 10CC EACH  Final   Culture   Final           BLOOD CULTURE RECEIVED NO GROWTH TO DATE CULTURE WILL BE HELD FOR 5 DAYS BEFORE ISSUING A FINAL NEGATIVE REPORT Performed at [facility]    Report Status PENDING  Incomplete  Malaria smear     Status: None   Collection Time: 09/28/14 12:47 AM  Result Value Ref Range Status   Specimen Description BLOOD  Final   Special Requests NONE  Final   Malaria Prep   Final    PLASMODIUM FALCIPARUM 15% PARASITEMIA REVIEWED BY PATHOLOGIST DR Ezequiel Ganser WINTER 09/28/14 CRITICAL RESULT CALLED TO, READ BACK BY AND VERIFIED WITH: ELLA BETHEL 09/28/14 325PM BY SUM[facility] FAXED TO [facility] 09/29/14 BY LYONK Performed at [facility]    Report Status 09/29/2014 FINAL  Final  MRSA PCR Screening     Status: None   Collection Time: 09/28/14  3:45 AM  Result Value Ref Range Status   MRSA by PCR NEGATIVE NEGATIVE Final     Comment:        The GeneXpert MRSA Assay (FDA approved for NASAL specimens only), is one component of a comprehensive MRSA colonization surveillance program. It is not intended to diagnose MRSA infection nor to guide or monitor treatment for MRSA infections.   Urine culture     Status: None   Collection Time: 09/28/14 10:59 AM  Result Value Ref Range Status   Specimen Description URINE, CLEAN CATCH  Final   Special Requests NONE  Final   Colony Count   Final    25,000 COLONIES/ML Performed at [facility]    Culture   Final    Multiple bacterial morphotypes present, none predominant. Suggest appropriate recollection if clinically indicated. Performed at [facility]    Report Status  09/30/2014 FINAL  Final  Clostridium Difficile by PCR     Status: None   Collection Time: 09/29/14  4:18 AM  Result Value Ref Range Status   C difficile by pcr NEGATIVE NEGATIVE Final  Malaria smear     Status: None   Collection Time: 09/30/14  1:34 PM  Result Value Ref Range Status   Specimen Description BLOOD  Final   Special Requests Normal  Final   Malaria Prep   Final    PLASMODIUM FALCIPARUM <0.1% PARASITEMIA CRITICAL RESULT CALLED TO, READ BACK BY AND VERIFIED WITH: ADRIENNE W/5W 10/01/14 730AM BY SUM[facility] FAXED TO [facility] BY PEAKY Performed at [facility]    Report Status 10/02/2014 FINAL  Final     Labs: Basic Metabolic Panel:  Recent Labs Lab 09/29/14 0010 09/29/14 1215 09/29/14 2344 09/30/14 0554 09/30/14 1520  NA 133* 134* 132* 132* 131*  K 3.0* 3.3* 3.9 4.2 3.9  CL 101 103 101 99 98  CO2 25 24 24 24 24   GLUCOSE 228* 166* 148* 143* 130*  BUN 8 7 8 8 7   CREATININE 0.72 0.66 0.73 0.70 0.65  CALCIUM 7.5* 7.7* 8.0* 8.2* 8.5  MG  --   --   --  1.7  --   PHOS  --   --   --  2.5  --    Liver Function Tests:  Recent Labs Lab 09/28/14 1030 09/29/14 0010 09/29/14 1215 09/29/14 2344 09/30/14 1520  AST 42* 42* 43* 50* 51*  ALT 22 23 23 21  23   ALKPHOS 76 73 66 60 62  BILITOT 4.7* 5.7* 7.1* 7.8* 6.2*  PROT 5.3* 5.6* 6.3 5.9* 6.1  ALBUMIN 2.0* 1.9* 1.9* 1.9* 1.9*   No results for input(s): LIPASE, AMYLASE in the last 168 hours. No results for input(s): AMMONIA in the last 168 hours. CBC:  Recent Labs Lab 09/27/14 1845 09/28/14 1425 09/29/14 0010 09/30/14 0554 09/30/14 1520  WBC 9.5 9.3 8.7 9.6 10.6*  NEUTROABS  --   --   --   --  5.8  HGB 12.5 9.8* 8.4* 8.1* 8.4*  HCT 33.3* 26.9* 22.7* 22.0* 22.9*  MCV 77.6* 78.9 77.7* 76.4* 76.8*  PLT 18* 49* 38* PLATELET CLUMPS NOTED ON SMEAR, UNABLE TO ESTIMATE 118*   Cardiac Enzymes: No results for input(s): CKTOTAL, CKMB, CKMBINDEX, TROPONINI in the last 168 hours. BNP: BNP (last 3 results) No results for input(s): BNP in the last 8760 hours.  ProBNP (last 3 results) No results for input(s): PROBNP in the last 8760 hours.  CBG:  Recent Labs Lab 10/01/14 1240 10/01/14 1804 10/01/14 2136 10/02/14 0803 10/02/14 1155  GLUCAP 125* 83 104* 110* 160*       Signed:  [facility] 10/02/2014, 3:29 PM

## 2014-10-02 NOTE — Progress Notes (Signed)
Pharmacy out of stock of Coartem. Pt was scheduled for 4 tablets but only 3tablets were available. On-call MD notified and requested that the 3 tablets still be given.

## 2014-10-03 LAB — CULTURE, BLOOD (ROUTINE X 2)
CULTURE: NO GROWTH
Culture: NO GROWTH

## 2014-10-06 ENCOUNTER — Encounter: Payer: Self-pay | Admitting: Family Medicine

## 2014-10-06 ENCOUNTER — Ambulatory Visit: Payer: Self-pay | Attending: Family Medicine | Admitting: Family Medicine

## 2014-10-06 VITALS — BP 129/81 | HR 75 | Temp 98.4°F | Resp 18 | Ht 62.0 in | Wt 169.0 lb

## 2014-10-06 DIAGNOSIS — I10 Essential (primary) hypertension: Secondary | ICD-10-CM | POA: Insufficient documentation

## 2014-10-06 DIAGNOSIS — D649 Anemia, unspecified: Secondary | ICD-10-CM | POA: Insufficient documentation

## 2014-10-06 DIAGNOSIS — B54 Unspecified malaria: Secondary | ICD-10-CM | POA: Insufficient documentation

## 2014-10-06 LAB — CBC WITH DIFFERENTIAL/PLATELET
Basophils Absolute: 0 10*3/uL (ref 0.0–0.1)
Basophils Relative: 0 % (ref 0–1)
Eosinophils Absolute: 0.1 10*3/uL (ref 0.0–0.7)
Eosinophils Relative: 1 % (ref 0–5)
HEMATOCRIT: 30.9 % — AB (ref 36.0–46.0)
HEMOGLOBIN: 10 g/dL — AB (ref 12.0–15.0)
LYMPHS ABS: 4.2 10*3/uL — AB (ref 0.7–4.0)
LYMPHS PCT: 42 % (ref 12–46)
MCH: 28.7 pg (ref 26.0–34.0)
MCHC: 32.4 g/dL (ref 30.0–36.0)
MCV: 88.8 fL (ref 78.0–100.0)
MPV: 11.1 fL (ref 8.6–12.4)
Monocytes Absolute: 0.8 10*3/uL (ref 0.1–1.0)
Monocytes Relative: 8 % (ref 3–12)
Neutro Abs: 4.9 10*3/uL (ref 1.7–7.7)
Neutrophils Relative %: 49 % (ref 43–77)
Platelets: 607 10*3/uL — ABNORMAL HIGH (ref 150–400)
RBC: 3.48 MIL/uL — ABNORMAL LOW (ref 3.87–5.11)
RDW: 17.8 % — AB (ref 11.5–15.5)
WBC: 10 10*3/uL (ref 4.0–10.5)

## 2014-10-06 LAB — COMPREHENSIVE METABOLIC PANEL
ALK PHOS: 114 U/L (ref 39–117)
ALT: 70 U/L — ABNORMAL HIGH (ref 0–35)
AST: 94 U/L — AB (ref 0–37)
Albumin: 3.3 g/dL — ABNORMAL LOW (ref 3.5–5.2)
BUN: 12 mg/dL (ref 6–23)
CO2: 26 mEq/L (ref 19–32)
Calcium: 9.1 mg/dL (ref 8.4–10.5)
Chloride: 96 mEq/L (ref 96–112)
Creat: 0.82 mg/dL (ref 0.50–1.10)
Glucose, Bld: 169 mg/dL — ABNORMAL HIGH (ref 70–99)
Potassium: 4.9 mEq/L (ref 3.5–5.3)
Sodium: 133 mEq/L — ABNORMAL LOW (ref 135–145)
Total Bilirubin: 2.1 mg/dL — ABNORMAL HIGH (ref 0.2–1.2)
Total Protein: 8.1 g/dL (ref 6.0–8.3)

## 2014-10-06 MED ORDER — HYDROCHLOROTHIAZIDE 25 MG PO TABS: 25.0000 mg | ORAL_TABLET | Freq: Every day | ORAL | Status: DC

## 2014-10-06 MED ORDER — NIFEDIPINE ER OSMOTIC RELEASE 30 MG PO TB24: 30.0000 mg | ORAL_TABLET | Freq: Every day | ORAL | Status: DC

## 2014-10-06 MED ORDER — HYDROCHLOROTHIAZIDE 12.5 MG PO TABS
12.5000 mg | ORAL_TABLET | Freq: Every day | ORAL | Status: DC
Start: 2014-10-06 — End: 2014-11-01

## 2014-10-06 NOTE — Progress Notes (Signed)
Patient hospitalized April 27-May 2 for malaria. Patient reports feeling better and reports no pain.

## 2014-10-06 NOTE — Progress Notes (Signed)
Subjective:    Patient ID: [name], female    DOB: May 07, 1951, 64 y.o.   MRN: [id number]  HPI   Admit date: 09/27/14  Discharge date: 10/02/2014    [name] is a 64 year old female with a history of hypertension presented to the ED at [facility] with fever, chills , malaise , dark urine and had recently come into the country from [place]. She was found to have fever of 100.5, mild hypotension. Her labs were significant for thrombocytopenia, hyponatremia, hypokalemia, elevated Cr. Elevated bilirubin of 4, and lactic acid of 7.7. She had a peripheral blood smear showing high parasitemia (% not reported). She was stared on IVF and admitted with a diagnosis of severe malaria and started on doxycycline 100mg  IV q 12. repleted 20mEQ potassium for hypokalemia. She was admitted to ICU for close observation while receiving IV quinidine and she was changed to Co-artem subsequently.  After she completed her antibiotics and her condition improved she was discharged    Interval history:  She reports doing well and is asymptomatic at this time. Accompanied by her daughter.  Past Medical History  Diagnosis Date  . Hypertension   . Malaria     Past Surgical History  Procedure Laterality Date  . Cesarean section      Family History  Problem Relation Age of Onset  . Hypertension Mother   . Diabetes Mother   . Cancer Father     History   Social History  . Marital Status: Married    Spouse Name: N/A  . Number of Children: N/A  . Years of Education: N/A   Occupational History  . Not on file.   Social History Main Topics  . Smoking status: Never Smoker   . Smokeless tobacco: Not on file  . Alcohol Use: No     Comment: occassional  . Drug Use: No  . Sexual Activity: Yes    Birth Control/ Protection: None   Other Topics Concern  . Not on file   Social History Narrative    No Known Allergies  Current Outpatient Prescriptions on File Prior to Visit  Medication Sig  Dispense Refill  . ibuprofen (ADVIL,MOTRIN) 400 MG tablet Take 400 mg by mouth every 6 (six) hours as needed.     No current facility-administered medications on file prior to visit.     Review of Systems  Constitutional: Negative for activity change, appetite change and fatigue.  HENT: Negative for congestion, sinus pressure and sore throat.   Eyes: Negative for visual disturbance.  Respiratory: Negative for cough, chest tightness, shortness of breath and wheezing.   Cardiovascular: Negative for chest pain and palpitations.  Gastrointestinal: Negative for abdominal pain, constipation and abdominal distention.  Endocrine: Negative for polydipsia.  Genitourinary: Negative for dysuria and frequency.  Musculoskeletal: Negative for back pain and arthralgias.  Skin: Negative for rash.  Neurological: Negative for tremors, light-headedness and numbness.  Hematological: Does not bruise/bleed easily.  Psychiatric/Behavioral: Negative for behavioral problems and agitation.         Objective: Filed Vitals:   10/06/14 1001  BP: 129/81  Pulse: 75  Temp: 98.4 F (36.9 C)  Resp: 18      Physical Exam  Constitutional: She is oriented to person, place, and time. She appears well-developed and well-nourished. No distress.  HENT:  Head: Normocephalic.  Right Ear: External ear normal.  Left Ear: External ear normal.  Nose: Nose normal.  Mouth/Throat: Oropharynx is clear and moist.  Eyes: Conjunctivae and EOM  are normal. Pupils are equal, round, and reactive to light.  Neck: Normal range of motion. No JVD present.  Cardiovascular: Normal rate, regular rhythm, normal heart sounds and intact distal pulses.  Exam reveals no gallop.   No murmur heard. Pulmonary/Chest: Effort normal and breath sounds normal. No respiratory distress. She has no wheezes. She has no rales. She exhibits no tenderness.  Abdominal: Soft. Bowel sounds are normal. She exhibits no distension and no mass. There is no  tenderness.  Musculoskeletal: Normal range of motion. She exhibits no edema or tenderness.  Neurological: She is alert and oriented to person, place, and time. She has normal reflexes.  Skin: Skin is warm and dry. She is not diaphoretic.  Psychiatric: She has a normal mood and affect.      Malaria Prep   Malaria smear       Collected: 09/30/14 1334   Resulting lab: [facility]   Value: PLASMODIUM FALCIPARUM <0.1% PARASITEMIA CRITICAL RESULT CALLED TO, READ BACK BY AND VERIFIED WITH: ADRIENNE W/5W 10/01/14 730AM BY SUM[facility] FAXED TO [facility] BY PEAKY  Performed at [facility]:      Malaria smear (Order [id number])      Malaria smear  Status: Finalresult Visible to patient:  Not Released Nextappt: 11/01/2014 at 09:00 AM in Internal Medicine Adrian Blackwater, Lennox Laity, MD)         6d ago    Specimen Description BLOOD   Special Requests Normal   Malaria Prep PLASMODIUM FALCIPARUM <0.1% PARASITEMIA CRITICAL RESULT CALLED TO, READ BACK BY AND VERIFIED WITH: ADRIENNE W/5W 10/01/14 730AM BY SUMMR FAXED TO [facility] BY PEAKY  Performed at [facility]       Report Status 10/02/2014 FINAL   Resulting Agency [facility]      Specimen Collected: 09/30/14 1:34 PM Last Resulted: 10/02/14 12:21 PM                         Assessment & Plan:  65 year old female patient with a history of hypertension recently hospitalized for malaria and history is notable for a recent trip to [place].  Hypertension: Controlled. Continue nifedipine and I have substituted Bendrofluazide which she was taking in [place] with HCTZ.  Malaria: Resolved. I will send off a malaria smear to evaluate for complete resolution.  Anemia: Repeat CBC sent off.

## 2014-10-06 NOTE — Patient Instructions (Signed)

## 2014-10-09 ENCOUNTER — Telehealth: Payer: Self-pay

## 2014-10-09 LAB — MALARIA SMEAR

## 2014-10-09 NOTE — Telephone Encounter (Signed)
-----   Message from Arnoldo Morale, MD sent at 10/09/2014 12:09 AM EDT ----- Labs reveal improvement in anemia and bilirubin trending down, malaria smear still pending.

## 2014-10-09 NOTE — Telephone Encounter (Signed)
Nurse called patient, patient verified date of birth. Patient aware of improved anemia and bilirubin is going down. Patient aware of negative malaria smear per. Dr. Jarold Song. Patient voices understanding.

## 2014-10-10 ENCOUNTER — Telehealth: Payer: Self-pay | Admitting: *Deleted

## 2014-10-10 DIAGNOSIS — B54 Unspecified malaria: Secondary | ICD-10-CM

## 2014-10-10 MED ORDER — DOXYCYCLINE HYCLATE 100 MG PO TABS: 100.0000 mg | ORAL_TABLET | Freq: Two times a day (BID) | ORAL | Status: DC

## 2014-10-10 NOTE — Telephone Encounter (Signed)
Please inform [name] that despite a parasitemia of <1% (which is down from >15% during hospitalization) , the lab can still identify malaria parasites and after consulting with the infectious disease specialist- Dr Baxter Flattery, we have decided to treat [name] again. Doxycycline prescription sent to the pharmacy and please schedule [name] for a lab appointment on 10/20/14 for a repeat smear. I called [name] on the phone number she has listed but could not reach [name].

## 2014-10-10 NOTE — Telephone Encounter (Signed)
[facility] lab called with results of patient's recent Malaria test-positive for plasmodia falciparum.  Will route to MD Amao.

## 2014-10-11 ENCOUNTER — Telehealth: Payer: Self-pay

## 2014-10-11 NOTE — Telephone Encounter (Signed)
Nurse called patient, patient answered. Call transferred to Dr. Jarold Song. Dr. Jarold Song spoke with patient.

## 2014-10-11 NOTE — Telephone Encounter (Signed)
Patient aware of malaria smear results per Dr. Jarold Song. Patient agrees to pick up prescription at pharmacy. Patient aware of malaria smear needed on 10/20/14 upon completion of antibiotic. Nurse scheduled patient for labs on Oct 20, 2014 at 10am per patient request.

## 2014-10-20 ENCOUNTER — Other Ambulatory Visit: Payer: Self-pay

## 2014-10-20 ENCOUNTER — Ambulatory Visit: Payer: Self-pay | Attending: Family Medicine

## 2014-10-20 DIAGNOSIS — B54 Unspecified malaria: Secondary | ICD-10-CM

## 2014-10-23 LAB — MALARIA SMEAR
Result - MALAR: NEGATIVE
Result - MALAR: NONE SEEN

## 2014-10-24 ENCOUNTER — Telehealth: Payer: Self-pay

## 2014-10-24 NOTE — Telephone Encounter (Signed)
Nurse called [name], [name] verified date of birth. [name] aware of malaria smear revealing no plasmodium or other blood parasites, malaria is negative. [name] voices understanding. [name] verified appointment on 11/01/14 at 9am with Dr. Adrian Blackwater.

## 2014-10-24 NOTE — Telephone Encounter (Signed)
-----   Message from Arnoldo Morale, MD sent at 10/23/2014 10:42 PM EDT ----- Please involve her that this smear reveals no plasmodium or other blood parasites (compared to the last one which was <1%).

## 2014-11-01 ENCOUNTER — Encounter: Payer: Self-pay | Admitting: Family Medicine

## 2014-11-01 ENCOUNTER — Ambulatory Visit: Payer: Self-pay | Attending: Family Medicine | Admitting: Family Medicine

## 2014-11-01 VITALS — BP 130/75 | HR 80 | Temp 99.3°F | Resp 16 | Ht 62.0 in | Wt 172.0 lb

## 2014-11-01 DIAGNOSIS — Z0000 Encounter for general adult medical examination without abnormal findings: Secondary | ICD-10-CM

## 2014-11-01 DIAGNOSIS — D649 Anemia, unspecified: Secondary | ICD-10-CM

## 2014-11-01 DIAGNOSIS — Z23 Encounter for immunization: Secondary | ICD-10-CM

## 2014-11-01 DIAGNOSIS — R5383 Other fatigue: Secondary | ICD-10-CM | POA: Insufficient documentation

## 2014-11-01 DIAGNOSIS — I10 Essential (primary) hypertension: Secondary | ICD-10-CM | POA: Insufficient documentation

## 2014-11-01 MED ORDER — TAB-A-VITE/IRON PO TABS: 1.0000 | ORAL_TABLET | Freq: Every day | ORAL | Status: DC

## 2014-11-01 MED ORDER — HYDROCHLOROTHIAZIDE 12.5 MG PO TABS: 12.5000 mg | ORAL_TABLET | Freq: Every day | ORAL | Status: DC

## 2014-11-01 MED ORDER — NIFEDIPINE ER OSMOTIC RELEASE 30 MG PO TB24: 30.0000 mg | ORAL_TABLET | Freq: Every day | ORAL | Status: DC

## 2014-11-01 NOTE — Assessment & Plan Note (Signed)
HTN: BP at goal  Continue HCTZ and nifedipine  Low salt diet

## 2014-11-01 NOTE — Assessment & Plan Note (Addendum)
Fatigue: I suspect from recent anemia due to malaria  Ordered multivitamin

## 2014-11-01 NOTE — Progress Notes (Signed)
Establish Care F/U HTN

## 2014-11-01 NOTE — Patient Instructions (Addendum)
[name],  Thank you for coming in today. It was a pleasure meeting you. I look forward to being your primary doctor.   1. HTN: BP at goal  Continue HCTZ and nifedipine  Low salt diet  2. Fatigue: I suspect from recent anemia due to malaria  Ordered multivitamin   3. Healthcare maintenance: Mammogram ordered, call to schedule after orange card approved.  GI referral for colonoscopy  F/u in 4-6 weeks for pap smear  Dr. Adrian Blackwater

## 2014-11-01 NOTE — Progress Notes (Signed)
   Subjective:    Patient ID: [name], female    DOB: February 01, 1951, 64 y.o.   MRN: [id number] CC: meet PCP, f.u HTN  HPI  1. CHRONIC HYPERTENSION  Disease Monitoring  Blood pressure range: not checking   Chest pain: no   Dyspnea: no   Claudication: no   Medication compliance: no  Medication Side Effects  Lightheadedness: no   Urinary frequency: no   Edema: no     Preventitive Healthcare:  Exercise: yes   Diet Pattern: well rounded, regular meals   Salt Restriction: yes   2. Fatigue: since malaria diagnosis in April 2016. Patient feels that her energy levels are low. She denies dark urine or blood in stool. She became anemic during malaria illness with Hgb from 12 to 8. Last Hgb 10. She request multivitamin.   3. Healthcare maintenance : normal mammogram in [place] some years ago. Normal pap smear 3 years ago. Never had colonoscopy.   Soc Hx: non smoker  Review of Systems  Constitutional: Negative for chills.  Respiratory: Negative for shortness of breath.   Cardiovascular: Negative for chest pain.      Objective:   Physical Exam BP 130/75 mmHg  Pulse 80  Temp(Src) 99.3 F (37.4 C) (Oral)  Resp 16  Ht 5\' 2"  (1.575 m)  Wt 172 lb (78.019 kg)  BMI 31.45 kg/m2  SpO2 98% General appearance: alert, cooperative and no distress Lungs: clear to auscultation bilaterally Heart: regular rate and rhythm, S1, S2 normal, no murmur, click, rub or gallop Extremities: extremities normal, atraumatic, no cyanosis or edema       Assessment & Plan:

## 2014-11-01 NOTE — Assessment & Plan Note (Signed)
Healthcare maintenance: Mammogram ordered, call to schedule after orange card approved.  GI referral for colonoscopy

## 2014-11-06 ENCOUNTER — Ambulatory Visit: Payer: Self-pay

## 2014-11-29 ENCOUNTER — Encounter: Payer: Self-pay | Admitting: Family Medicine

## 2014-11-29 ENCOUNTER — Ambulatory Visit: Payer: Self-pay | Attending: Family Medicine | Admitting: Family Medicine

## 2014-11-29 VITALS — BP 138/85 | HR 75 | Temp 99.1°F | Resp 16 | Ht 62.0 in | Wt 172.0 lb

## 2014-11-29 DIAGNOSIS — Z124 Encounter for screening for malignant neoplasm of cervix: Secondary | ICD-10-CM

## 2014-11-29 DIAGNOSIS — I10 Essential (primary) hypertension: Secondary | ICD-10-CM

## 2014-11-29 MED ORDER — HYDROCHLOROTHIAZIDE 12.5 MG PO TABS: 12.5000 mg | ORAL_TABLET | Freq: Every day | ORAL | Status: DC

## 2014-11-29 MED ORDER — NIFEDIPINE ER OSMOTIC RELEASE 30 MG PO TB24: 30.0000 mg | ORAL_TABLET | Freq: Every day | ORAL | Status: DC

## 2014-11-29 NOTE — Progress Notes (Signed)
Annual pap smear No Vaginal discharge No odor

## 2014-11-29 NOTE — Addendum Note (Signed)
Addended by: Boykin Nearing on: 11/29/2014 10:41 AM   Modules accepted: Orders

## 2014-11-29 NOTE — Assessment & Plan Note (Signed)
Normal pelvic exam Pap done today

## 2014-11-29 NOTE — Progress Notes (Signed)
   Subjective:    Patient ID: [name], female    DOB: 12-Jul-1950, 64 y.o.   MRN: [id number] CC; pap smear  HPI   64 yo F presents for pap smear. No history of abnormal paps. Has hx of C-section 27 yrs ago. No abdominal pain, vaginal discharge, vaginal bleeding or pain.   Elevated BP: has known hx of HTN. Taking HCTZ 12.5 mg daily and nifedipine  30 mg daily. Non smoker. Has some neck pain. No CP or SOB  Soc hx: non smoker  Review of Systems  Constitutional: Negative for fever and chills.  Gastrointestinal: Negative for abdominal pain.  Genitourinary: Negative for vaginal bleeding, vaginal discharge, vaginal pain and pelvic pain.       Objective:   Physical Exam BP 138/85 mmHg  Pulse 75  Temp(Src) 99.1 F (37.3 C) (Oral)  Resp 16  Ht 5\' 2"  (1.575 m)  Wt 172 lb (78.019 kg)  BMI 31.45 kg/m2  SpO2 98%  Wt Readings from Last 3 Encounters:  11/29/14 172 lb (78.019 kg)  11/01/14 172 lb (78.019 kg)  10/06/14 169 lb (76.658 kg)  General appearance: alert, cooperative and no distress Abdomen: normal findings: no masses palpable, soft, non-tender, symmetric and healed vertical C-section scar  Pelvic: cervix normal in appearance, external genitalia normal, no adnexal masses or tenderness, no cervical motion tenderness, rectovaginal septum normal, uterus normal size, shape, and consistency and vagina normal without discharge       Assessment & Plan:

## 2014-11-29 NOTE — Patient Instructions (Addendum)
Mrs. Basden,  Thank you for coming in today  1. Normal pelvic exam, pap done today. Mammogram ordered at last visit  GI referral for screening colonoscopy placed at last visit   2. HTN: BP at goal, continue current regimen   F/u in 3 months with me for hypertension   Dr. Adrian Blackwater   Please call Rolena Infante, [phone],  with the BCCCP (breast and cervical cancer control program) at the [facility] to set up an appointment to verify eligibility for a breast exam, mammogram, ultrasound. If you qualify this will be set up at [facility].

## 2014-11-30 ENCOUNTER — Telehealth: Payer: Self-pay | Admitting: *Deleted

## 2014-11-30 LAB — CERVICOVAGINAL ANCILLARY ONLY
Chlamydia: NEGATIVE
Neisseria Gonorrhea: NEGATIVE
Wet Prep (BD Affirm): NEGATIVE

## 2014-11-30 LAB — CYTOLOGY - PAP

## 2014-11-30 NOTE — Telephone Encounter (Signed)
Pt aware of results 

## 2014-11-30 NOTE — Telephone Encounter (Signed)
-----   Message from Boykin Nearing, MD sent at 11/30/2014  8:52 AM EDT ----- Negative wet prep

## 2014-11-30 NOTE — Telephone Encounter (Signed)
-----   Message from Boykin Nearing, MD sent at 11/30/2014  9:28 AM EDT ----- Neg Gc/chlam

## 2014-12-05 ENCOUNTER — Telehealth: Payer: Self-pay | Admitting: *Deleted

## 2014-12-05 NOTE — Telephone Encounter (Signed)
Pt aware of results 

## 2014-12-05 NOTE — Telephone Encounter (Signed)
-----   Message from Boykin Nearing, MD sent at 12/01/2014  3:35 PM EDT ----- Negative pap

## 2014-12-11 ENCOUNTER — Ambulatory Visit: Payer: Self-pay | Attending: Family Medicine

## 2015-01-04 ENCOUNTER — Ambulatory Visit (HOSPITAL_COMMUNITY)
Admission: RE | Admit: 2015-01-04 | Discharge: 2015-01-04 | Disposition: A | Discharge status: Home / Self Care | Payer: Self-pay | Source: Ambulatory Visit | Attending: Family Medicine | Admitting: Family Medicine

## 2015-01-04 DIAGNOSIS — Z0000 Encounter for general adult medical examination without abnormal findings: Secondary | ICD-10-CM

## 2015-01-17 ENCOUNTER — Ambulatory Visit: Payer: Self-pay | Admitting: Family Medicine

## 2015-02-28 ENCOUNTER — Encounter: Payer: Self-pay | Admitting: Family Medicine

## 2015-02-28 ENCOUNTER — Ambulatory Visit: Payer: Self-pay | Attending: Family Medicine | Admitting: Family Medicine

## 2015-02-28 VITALS — BP 123/77 | HR 76 | Temp 98.8°F | Resp 16 | Ht 62.0 in | Wt 174.0 lb

## 2015-02-28 DIAGNOSIS — H547 Unspecified visual loss: Secondary | ICD-10-CM

## 2015-02-28 DIAGNOSIS — I10 Essential (primary) hypertension: Secondary | ICD-10-CM

## 2015-02-28 DIAGNOSIS — Z0000 Encounter for general adult medical examination without abnormal findings: Secondary | ICD-10-CM

## 2015-02-28 LAB — CBC
HEMATOCRIT: 37.8 % (ref 36.0–46.0)
Hemoglobin: 12.7 g/dL (ref 12.0–15.0)
MCH: 28.9 pg (ref 26.0–34.0)
MCHC: 33.6 g/dL (ref 30.0–36.0)
MCV: 86.1 fL (ref 78.0–100.0)
MPV: 11.4 fL (ref 8.6–12.4)
PLATELETS: 326 10*3/uL (ref 150–400)
RBC: 4.39 MIL/uL (ref 3.87–5.11)
RDW: 16.1 % — ABNORMAL HIGH (ref 11.5–15.5)
WBC: 7 10*3/uL (ref 4.0–10.5)

## 2015-02-28 LAB — COMPLETE METABOLIC PANEL WITH GFR
ALT: 19 U/L (ref 6–29)
AST: 22 U/L (ref 10–35)
Albumin: 4.3 g/dL (ref 3.6–5.1)
Alkaline Phosphatase: 60 U/L (ref 33–130)
BUN: 15 mg/dL (ref 7–25)
CO2: 28 mmol/L (ref 20–31)
CREATININE: 0.75 mg/dL (ref 0.50–0.99)
Calcium: 10.1 mg/dL (ref 8.6–10.4)
Chloride: 98 mmol/L (ref 98–110)
GFR, Est Non African American: 85 mL/min (ref >=60)
Glucose, Bld: 263 mg/dL — ABNORMAL HIGH (ref 65–99)
Potassium: 4.7 mmol/L (ref 3.5–5.3)
Sodium: 135 mmol/L (ref 135–146)
Total Bilirubin: 0.6 mg/dL (ref 0.2–1.2)
Total Protein: 7.9 g/dL (ref 6.1–8.1)

## 2015-02-28 MED ORDER — NIFEDIPINE ER OSMOTIC RELEASE 30 MG PO TB24: 30.0000 mg | ORAL_TABLET | Freq: Every day | ORAL | Status: DC

## 2015-02-28 MED ORDER — HYDROCHLOROTHIAZIDE 12.5 MG PO TABS: 12.5000 mg | ORAL_TABLET | Freq: Every day | ORAL | Status: DC

## 2015-02-28 NOTE — Progress Notes (Signed)
F/U HTN C/C pain on 1st finger bilateral  Loss of vision

## 2015-02-28 NOTE — Assessment & Plan Note (Signed)
A: BP well controlled Med: compliant P: Continue current regimen

## 2015-02-28 NOTE — Patient Instructions (Addendum)
Mrs. Morocco,  Thank you for coming in today  1. HTN: BP at goal  2. Thumb pain: is due to arthritis. Sign of stroke if facial droop, weakness or numbness on one side of the body or slurred speech. You have no signs of stroke  3. Trouble losing weight: Focus on regular exercise Add light weight work with 2-3 # weights Low carb diet, decrease intake of rice or fufu   4. Poor vision: Referral to optometry placed See list of low cost eye doctors below   Low out cost optometrist (about $65.00 for office visit)  1. Dr. Thurston Hole Phone # [phone] [address]  [place], [place] [address]   2. [facility]  Phone # [phone] [address], [place] [address]    F/u in 3 months for HTN  Dr. Adrian Blackwater

## 2015-02-28 NOTE — Progress Notes (Signed)
Patient ID: [name], female   DOB: [birth year], 64 y.o.   MRN: [id number]   Subjective:  Patient ID: [name], female    DOB: [birth year]  Age: 64 y.o. MRN: [id number]  CC: Hand Pain; Loss of Vision; and Hypertension   HPI [name] presents for    1. CHRONIC HYPERTENSION  Disease Monitoring  Blood pressure range: not checking   Chest pain: no   Dyspnea: no   Claudication: no   Medication compliance: yes  Medication Side Effects  Lightheadedness: no   Urinary frequency: no   Edema: no    Preventitive Healthcare:  Exercise: yes, 3 times a week     2. Finger pain: b/l thumb pain for many years. Off and on. She questions if this could be a sign of work. Taking advil once a while for pain.   3. Loss of vision: having trouble seeing far away. No double vision. No eye pain. No redness.   Social History  Substance Use Topics  . Smoking status: Never Smoker   . Smokeless tobacco: Never Used  . Alcohol Use: No     Comment: occassional    Outpatient Prescriptions Prior to Visit  Medication Sig Dispense Refill  . hydrochlorothiazide (HYDRODIURIL) 12.5 MG tablet Take 1 tablet (12.5 mg total) by mouth daily. 30 tablet 5  . Multiple Vitamins-Iron (MULTIVITAMINS WITH IRON) TABS tablet Take 1 tablet by mouth daily. 30 tablet 5  . NIFEdipine (PROCARDIA-XL/ADALAT-CC/NIFEDICAL-XL) 30 MG 24 hr tablet Take 1 tablet (30 mg total) by mouth daily. 30 tablet 5  . ibuprofen (ADVIL,MOTRIN) 400 MG tablet Take 400 mg by mouth every 6 (six) hours as needed.     No facility-administered medications prior to visit.    ROS Review of Systems  Constitutional: Negative for fever and chills.  Eyes: Positive for visual disturbance.  Respiratory: Negative for shortness of breath.   Cardiovascular: Negative for chest pain.  Gastrointestinal: Negative for abdominal pain and blood in stool.  Musculoskeletal: Negative for back pain and arthralgias.  Skin: Negative for rash.    Allergic/Immunologic: Negative for immunocompromised state.  Hematological: Negative for adenopathy. Does not bruise/bleed easily.  Psychiatric/Behavioral: Negative for suicidal ideas and dysphoric mood.    Objective:  BP 123/77 mmHg  Pulse 76  Temp(Src) 98.8 F (37.1 C) (Oral)  Resp 16  Ht 5\' 2"  (1.575 m)  Wt 174 lb (78.926 kg)  BMI 31.82 kg/m2  SpO2 96%  BP/Weight 02/28/2015 7/82/9562 06/05/863  Systolic BP 784 696 295  Diastolic BP 77 85 75  Wt. (Lbs) 174 172 172  BMI 31.82 31.45 31.45   Physical Exam  Constitutional: She is oriented to person, place, and time. She appears well-developed and well-nourished. No distress.  HENT:  Head: Normocephalic and atraumatic.  Cardiovascular: Normal rate, regular rhythm, normal heart sounds and intact distal pulses.   Pulmonary/Chest: Effort normal and breath sounds normal.  Musculoskeletal: She exhibits no edema.  Neurological: She is alert and oriented to person, place, and time.  Skin: Skin is warm and dry. No rash noted.  Psychiatric: She has a normal mood and affect.     Assessment & Plan:   Problem List Items Addressed This Visit    RESOLVED: Healthcare maintenance - Primary (Chronic)   Relevant Orders   Flu Vaccine QUAD 36+ mos IM   Hypertension (Chronic)    A: BP well controlled Med: compliant P: Continue current regimen       Relevant Medications   NIFEdipine (PROCARDIA-XL/ADALAT-CC/NIFEDICAL-XL) 30 MG  24 hr tablet   hydrochlorothiazide (HYDRODIURIL) 12.5 MG tablet   Other Relevant Orders   CBC   COMPLETE METABOLIC PANEL WITH GFR   Poor vision   Relevant Orders   Ambulatory referral to Optometry      No orders of the defined types were placed in this encounter.    Follow-up: No Follow-up on file.   Boykin Nearing MD

## 2015-03-02 ENCOUNTER — Telehealth: Payer: Self-pay | Admitting: *Deleted

## 2015-03-02 NOTE — Telephone Encounter (Signed)
Patient verified DOB Patient made aware of sugar level being 263. Patient was advised to lower carbohydrate intake and schedule F/U for A1C and CBG Patient had no further questions.

## 2015-03-02 NOTE — Telephone Encounter (Signed)
-----   Message from Boykin Nearing, MD sent at 03/01/2015  9:24 AM EDT ----- Blood sugar 263 on last visit Patient may have become diabetic Will need f/u for A1c and CBG check please schedule f/u OV  Low carb diet, decrease rice, fufu,, bread, pasta etc.

## 2015-03-07 LAB — HM DIABETES EYE EXAM

## 2015-03-13 ENCOUNTER — Ambulatory Visit: Payer: Self-pay | Attending: Family Medicine | Admitting: Family Medicine

## 2015-03-13 ENCOUNTER — Encounter: Payer: Self-pay | Admitting: Family Medicine

## 2015-03-13 VITALS — BP 126/76 | HR 69 | Temp 98.8°F | Resp 16 | Ht 62.0 in | Wt 172.0 lb

## 2015-03-13 DIAGNOSIS — H269 Unspecified cataract: Secondary | ICD-10-CM | POA: Insufficient documentation

## 2015-03-13 DIAGNOSIS — Z833 Family history of diabetes mellitus: Secondary | ICD-10-CM | POA: Insufficient documentation

## 2015-03-13 DIAGNOSIS — Z79899 Other long term (current) drug therapy: Secondary | ICD-10-CM | POA: Insufficient documentation

## 2015-03-13 DIAGNOSIS — Z8249 Family history of ischemic heart disease and other diseases of the circulatory system: Secondary | ICD-10-CM | POA: Insufficient documentation

## 2015-03-13 DIAGNOSIS — E119 Type 2 diabetes mellitus without complications: Secondary | ICD-10-CM | POA: Insufficient documentation

## 2015-03-13 DIAGNOSIS — H02403 Unspecified ptosis of bilateral eyelids: Secondary | ICD-10-CM | POA: Insufficient documentation

## 2015-03-13 DIAGNOSIS — Z0000 Encounter for general adult medical examination without abnormal findings: Secondary | ICD-10-CM

## 2015-03-13 DIAGNOSIS — H01009 Unspecified blepharitis unspecified eye, unspecified eyelid: Secondary | ICD-10-CM | POA: Insufficient documentation

## 2015-03-13 LAB — GLUCOSE, POCT (MANUAL RESULT ENTRY): POC GLUCOSE: 187 mg/dL — AB (ref 70–99)

## 2015-03-13 LAB — LIPID PANEL
Cholesterol: 185 mg/dL (ref 125–200)
HDL: 57 mg/dL (ref >=46)
LDL Cholesterol: 115 mg/dL (ref <130)
Total CHOL/HDL Ratio: 3.2 Ratio (ref <=5.0)
Triglycerides: 66 mg/dL (ref <150)
VLDL: 13 mg/dL (ref <30)

## 2015-03-13 LAB — POCT GLYCOSYLATED HEMOGLOBIN (HGB A1C): Hemoglobin A1C: 7.7

## 2015-03-13 MED ORDER — METFORMIN HCL ER 500 MG PO TB24: 1000.0000 mg | ORAL_TABLET | Freq: Every day | ORAL | Status: DC

## 2015-03-13 MED ORDER — TRUEPLUS LANCETS 28G MISC: 1.0000 | Freq: Every day | Status: DC

## 2015-03-13 MED ORDER — TRUE METRIX METER W/DEVICE KIT: 1.0000 | PACK | Status: DC | PRN

## 2015-03-13 MED ORDER — GLUCOSE BLOOD VI STRP: 1.0000 | ORAL_STRIP | Freq: Every day | Status: DC

## 2015-03-13 NOTE — Progress Notes (Signed)
Patient ID: Debara Kamphuis, female   DOB: April 06, 1951, 64 y.o.   MRN: [id number]   Subjective:  Patient ID: Sherronda Sweigert, female    DOB: 04/25/1951  Age: 64 y.o. MRN: [id number]  CC: Hyperglycemia  HPI Terriah Reggio presents for   1.Diabetes: diagnosed today. Patient brought back due to elevaed CBG on CMP. Has fam hx of diabetes in mother and sister. She admits to blurry vision. She saw the eye doctor on 03/07/2015. Note has been received and diagnoses added to problem list. Patient will be referred to opthalmology for cataracts. She has mild fatigue. Brisk walking has helped. She denies polyuria and polydipsia. She denies tingling and numbness in toes or fingers.   Family History  Problem Relation Age of Onset  . Hypertension Mother   . Diabetes Mother   . Cancer Father   . Diabetes Sister    Social History  Substance Use Topics  . Smoking status: Never Smoker   . Smokeless tobacco: Never Used  . Alcohol Use: No     Comment: occassional   Outpatient Prescriptions Prior to Visit  Medication Sig Dispense Refill  . hydrochlorothiazide (HYDRODIURIL) 12.5 MG tablet Take 1 tablet (12.5 mg total) by mouth daily. 30 tablet 5  . ibuprofen (ADVIL,MOTRIN) 400 MG tablet Take 400 mg by mouth every 6 (six) hours as needed.    . Multiple Vitamins-Iron (MULTIVITAMINS WITH IRON) TABS tablet Take 1 tablet by mouth daily. 30 tablet 5  . NIFEdipine (PROCARDIA-XL/ADALAT-CC/NIFEDICAL-XL) 30 MG 24 hr tablet Take 1 tablet (30 mg total) by mouth daily. 30 tablet 5   No facility-administered medications prior to visit.    ROS Review of Systems  Constitutional: Negative for fever and chills.  Eyes: Negative for visual disturbance.  Respiratory: Negative for shortness of breath.   Cardiovascular: Negative for chest pain.  Gastrointestinal: Negative for abdominal pain and blood in stool.  Musculoskeletal: Negative for back pain and arthralgias.  Skin: Negative for rash.  Allergic/Immunologic:  Negative for immunocompromised state.  Hematological: Negative for adenopathy. Does not bruise/bleed easily.  Psychiatric/Behavioral: Negative for suicidal ideas and dysphoric mood.    Objective:  BP 126/76 mmHg  Pulse 69  Temp(Src) 98.8 F (37.1 C) (Oral)  Resp 16  Ht $R'5\' 2"'Is$  (1.575 m)  Wt 172 lb (78.019 kg)  BMI 31.45 kg/m2  SpO2 98%  BP/Weight 03/13/2015 02/28/2015 9/37/1696  Systolic BP 789 381 017  Diastolic BP 76 77 85  Wt. (Lbs) 172 174 172  BMI 31.45 31.82 31.45     Physical Exam  Constitutional: She is oriented to person, place, and time. She appears well-developed and well-nourished. No distress.  HENT:  Head: Normocephalic and atraumatic.  Cardiovascular: Normal rate, regular rhythm, normal heart sounds and intact distal pulses.   Pulmonary/Chest: Effort normal and breath sounds normal.  Musculoskeletal: She exhibits no edema.  Neurological: She is alert and oriented to person, place, and time.  Skin: Skin is warm and dry. No rash noted.  Psychiatric: She has a normal mood and affect.    Lab Results  Component Value Date   HGBA1C 7.7 03/13/2015     Assessment & Plan:   Problem List Items Addressed This Visit    Bilateral cataracts   Relevant Orders   Ambulatory referral to Ophthalmology   Diabetes type 2, controlled ([place])   Relevant Medications   metFORMIN (GLUCOPHAGE XR) 500 MG 24 hr tablet   Blood Glucose Monitoring Suppl (TRUE METRIX METER) W/DEVICE KIT   TRUEPLUS LANCETS 28G MISC  glucose blood (TRUE METRIX BLOOD GLUCOSE TEST) test strip   Other Relevant Orders   Lipid Panel   Microalbumin/Creatinine Ratio, Urine   Ambulatory referral to Ophthalmology    Other Visit Diagnoses    Healthcare maintenance    -  Primary    Relevant Orders    POCT glycosylated hemoglobin (Hb A1C) (Completed)    POCT glucose (manual entry) (Completed)       No orders of the defined types were placed in this encounter.    Follow-up: No Follow-up on file.    Boykin Nearing MD

## 2015-03-13 NOTE — Patient Instructions (Addendum)
[name] was seen today for hyperglycemia.  Diagnoses and all orders for this visit:  Healthcare maintenance -     POCT glycosylated hemoglobin (Hb A1C) -     POCT glucose (manual entry)  Controlled type 2 diabetes mellitus without complication, without long-term current use of insulin (HCC) -     metFORMIN (GLUCOPHAGE XR) 500 MG 24 hr tablet; Take 2 tablets (1,000 mg total) by mouth daily after supper. -     Lipid Panel -     Microalbumin/Creatinine Ratio, Urine -     Blood Glucose Monitoring Suppl (TRUE METRIX METER) W/DEVICE KIT; 1 each by Does not apply route as needed. -     TRUEPLUS LANCETS 28G MISC; 1 each by Does not apply route daily. -     glucose blood (TRUE METRIX BLOOD GLUCOSE TEST) test strip; 1 each by Other route daily. -     Ambulatory referral to Ophthalmology  Bilateral cataracts -     Ambulatory referral to Ophthalmology   Diabetes blood sugar goals  Fasting (in AM before breakfast, 8 hrs of no eating or drinking (except water or unsweetened coffee or tea): 90-110 2 hrs after meals: < 160,   No low sugars: nothing < 70   F/u in 3 months for A1c check   Dr. Adrian Blackwater

## 2015-03-13 NOTE — Progress Notes (Signed)
Pt here for A1C and CBG check Pt fasting, last meal yesterday at 7:30 Pt asymptomatic.

## 2015-03-14 ENCOUNTER — Telehealth: Payer: Self-pay | Admitting: Family Medicine

## 2015-03-14 ENCOUNTER — Telehealth: Payer: Self-pay | Admitting: *Deleted

## 2015-03-14 LAB — MICROALBUMIN / CREATININE URINE RATIO: CREATININE, URINE: 27 mg/dL

## 2015-03-14 NOTE — Telephone Encounter (Signed)
Date of birth verified by pt Normal labs and urine results given to pt Pt verbalized understanding

## 2015-03-14 NOTE — Telephone Encounter (Signed)
-----   Message from Boykin Nearing, MD sent at 03/14/2015  8:39 AM EDT ----- Normal cholesterol and normal urine microalbumin

## 2015-03-14 NOTE — Telephone Encounter (Signed)
Patient understands that her results are normal but is confused because she was told earlier that her sugar was high. Please follow up with patient for clarification. Thank you.

## 2015-03-14 NOTE — Telephone Encounter (Signed)
Date of birth verified by pt Pt notified Cholesterol was normal  Glucose elevated at OV Continue taking medication as PCP prescribed  Pt verbalized understanding

## 2015-03-27 ENCOUNTER — Encounter: Payer: Self-pay | Admitting: Family Medicine

## 2015-05-10 ENCOUNTER — Encounter: Payer: Self-pay | Admitting: Family Medicine

## 2015-05-10 ENCOUNTER — Ambulatory Visit: Payer: Self-pay | Attending: Family Medicine | Admitting: Internal Medicine

## 2015-05-10 VITALS — BP 148/82 | HR 70 | Temp 98.7°F | Resp 16 | Ht 62.0 in | Wt 168.0 lb

## 2015-05-10 DIAGNOSIS — E119 Type 2 diabetes mellitus without complications: Secondary | ICD-10-CM

## 2015-05-10 DIAGNOSIS — X58XXXA Exposure to other specified factors, initial encounter: Secondary | ICD-10-CM | POA: Insufficient documentation

## 2015-05-10 DIAGNOSIS — T887XXA Unspecified adverse effect of drug or medicament, initial encounter: Secondary | ICD-10-CM

## 2015-05-10 DIAGNOSIS — T50905A Adverse effect of unspecified drugs, medicaments and biological substances, initial encounter: Secondary | ICD-10-CM

## 2015-05-10 DIAGNOSIS — Z79899 Other long term (current) drug therapy: Secondary | ICD-10-CM | POA: Insufficient documentation

## 2015-05-10 DIAGNOSIS — R42 Dizziness and giddiness: Secondary | ICD-10-CM | POA: Insufficient documentation

## 2015-05-10 DIAGNOSIS — Z7984 Long term (current) use of oral hypoglycemic drugs: Secondary | ICD-10-CM | POA: Insufficient documentation

## 2015-05-10 DIAGNOSIS — I10 Essential (primary) hypertension: Secondary | ICD-10-CM | POA: Insufficient documentation

## 2015-05-10 LAB — GLUCOSE, POCT (MANUAL RESULT ENTRY): POC GLUCOSE: 140 mg/dL — AB (ref 70–99)

## 2015-05-10 MED ORDER — GLIMEPIRIDE 2 MG PO TABS: 2.0000 mg | ORAL_TABLET | Freq: Every day | ORAL | Status: DC

## 2015-05-10 NOTE — Patient Instructions (Signed)
Please check sugars as directed. If your sugars drop below 70, please eat asap! Call if you have any problems

## 2015-05-10 NOTE — Progress Notes (Signed)
Medication management  Stated dizziness and sickness after taking metformin  No Sx after stopping metformin  No tobacco user  No pain today  No suicidal thought in the past two weeks

## 2015-05-10 NOTE — Progress Notes (Signed)
Patient ID: [name], female   DOB: 01/05/1951, 64 y.o.   MRN: [id number]  CC: medication concerns  HPI: [name] is a 64 y.o. female here today for a follow up visit.  Patient has past medical history of malaria, HTN, and diabetes. Patient is usually seen by Dr. Adrian Blackwater and reports that she was placed on Metformin XR 1,$RemoveBefore'000mg'FqzvoldThyFtq$  and has had GI intolerance and dizziness. Since that time she has stopped taking Metformin and symptoms have resolved. Her sugars since discontinuing Metformin have been 139-169 per her sugar log review. Before stopping Metformin blood sugars were 125 and below.  Patient presents with a slightly elevated blood pressure today and states that she did take her HCTZ but has been sleeping only 2 hours per night due to a religious fast. Review of EMR show last several readings have been at goal.  Patient will be leaving the country this weekend for a 3-6 month period. She would like a 3 month fill of her medications today.  No Known Allergies Past Medical History  Diagnosis Date  . Hypertension 2006  . Malaria 09/20/2014    Current Outpatient Prescriptions on File Prior to Visit  Medication Sig Dispense Refill  . Blood Glucose Monitoring Suppl (TRUE METRIX METER) W/DEVICE KIT 1 each by Does not apply route as needed. 1 kit 0  . glucose blood (TRUE METRIX BLOOD GLUCOSE TEST) test strip 1 each by Other route daily. 100 each 2  . hydrochlorothiazide (HYDRODIURIL) 12.5 MG tablet Take 1 tablet (12.5 mg total) by mouth daily. 30 tablet 5  . Multiple Vitamins-Iron (MULTIVITAMINS WITH IRON) TABS tablet Take 1 tablet by mouth daily. 30 tablet 5  . NIFEdipine (PROCARDIA-XL/ADALAT-CC/NIFEDICAL-XL) 30 MG 24 hr tablet Take 1 tablet (30 mg total) by mouth daily. 30 tablet 5  . TRUEPLUS LANCETS 28G MISC 1 each by Does not apply route daily. 100 each 2  . ibuprofen (ADVIL,MOTRIN) 400 MG tablet Take 400 mg by mouth every 6 (six) hours as needed.    . metFORMIN (GLUCOPHAGE XR) 500 MG  24 hr tablet Take 2 tablets (1,000 mg total) by mouth daily after supper. (Patient not taking: Reported on 05/10/2015) 60 tablet 3   No current facility-administered medications on file prior to visit.   Family History  Problem Relation Age of Onset  . Hypertension Mother   . Diabetes Mother   . Cancer Father   . Diabetes Sister    Social History   Social History  . Marital Status: Married    Spouse Name: N/A  . Number of Children: 3  . Years of Education: 12    Occupational History  . Not on file.   Social History Main Topics  . Smoking status: Never Smoker   . Smokeless tobacco: Never Used  . Alcohol Use: No     Comment: occassional  . Drug Use: No  . Sexual Activity: Yes    Birth Control/ Protection: None   Other Topics Concern  . Not on file   Social History Narrative   From [place]   Moved to [place] on 09/11/2014      Has 3 children- 1 daughter in [place],    1 child in [place]       Review of Systems: Other than what is stated in HPI, all other systems are negative.   Objective:   Filed Vitals:   05/10/15 1050  BP: 148/82  Pulse: 70  Temp: 98.7 F (37.1 C)  Resp: 16    Physical  Exam  Constitutional: She is oriented to person, place, and time.  Cardiovascular: Normal rate, regular rhythm and normal heart sounds.   Pulmonary/Chest: Effort normal and breath sounds normal.  Abdominal: There is no tenderness.  Neurological: She is alert and oriented to person, place, and time.  Skin: Skin is warm and dry.  Psychiatric: She has a normal mood and affect.     Lab Results  Component Value Date   WBC 7.0 02/28/2015   HGB 12.7 02/28/2015   HCT 37.8 02/28/2015   MCV 86.1 02/28/2015   PLT 326 02/28/2015   Lab Results  Component Value Date   CREATININE 0.75 02/28/2015   BUN 15 02/28/2015   NA 135 02/28/2015   K 4.7 02/28/2015   CL 98 02/28/2015   CO2 28 02/28/2015    Lab Results  Component Value Date   HGBA1C 7.7 03/13/2015   Lipid Panel      Component Value Date/Time   CHOL 185 03/13/2015 1043   TRIG 66 03/13/2015 1043   HDL 57 03/13/2015 1043   CHOLHDL 3.2 03/13/2015 1043   VLDL 13 03/13/2015 1043   LDLCALC 115 03/13/2015 1043       Assessment and plan:   [name] was seen today for medication problem.  Diagnoses and all orders for this visit:  Controlled type 2 diabetes mellitus without complication, without long-term current use of insulin (HCC) -     POCT glucose (manual entry) -     glimepiride (AMARYL) 2 MG tablet; Take 1 tablet (2 mg total) by mouth daily before breakfast. Discontinued Metformin due to side effects and will switch patient to Amaryl 2 mg daily. I have went over side effects of medication and what to do in the event she has a hypoglycemic event.  Patient may be a possible candidate for Invokana or Januvia once she returns and can apply for PASS program.   Drug side effects, initial encounter See above  Return in about 3 months (around 08/08/2015) for Diabetes Mellitus.      Lance Bosch, [facility] [phone] 05/10/2015, 11:26 AM

## 2016-04-26 ENCOUNTER — Encounter (HOSPITAL_BASED_OUTPATIENT_CLINIC_OR_DEPARTMENT_OTHER): Payer: Self-pay | Admitting: *Deleted

## 2016-04-26 ENCOUNTER — Emergency Department (HOSPITAL_BASED_OUTPATIENT_CLINIC_OR_DEPARTMENT_OTHER)
Admission: EM | Admit: 2016-04-26 | Discharge: 2016-04-26 | Disposition: A | Discharge status: Home / Self Care | Payer: Self-pay | Attending: Emergency Medicine | Admitting: Emergency Medicine

## 2016-04-26 ENCOUNTER — Emergency Department (HOSPITAL_BASED_OUTPATIENT_CLINIC_OR_DEPARTMENT_OTHER): Payer: Self-pay

## 2016-04-26 DIAGNOSIS — M545 Low back pain, unspecified: Secondary | ICD-10-CM

## 2016-04-26 DIAGNOSIS — Z79899 Other long term (current) drug therapy: Secondary | ICD-10-CM | POA: Insufficient documentation

## 2016-04-26 DIAGNOSIS — M5442 Lumbago with sciatica, left side: Secondary | ICD-10-CM | POA: Insufficient documentation

## 2016-04-26 DIAGNOSIS — M5441 Lumbago with sciatica, right side: Secondary | ICD-10-CM | POA: Insufficient documentation

## 2016-04-26 DIAGNOSIS — M5431 Sciatica, right side: Secondary | ICD-10-CM

## 2016-04-26 DIAGNOSIS — Z791 Long term (current) use of non-steroidal anti-inflammatories (NSAID): Secondary | ICD-10-CM | POA: Insufficient documentation

## 2016-04-26 DIAGNOSIS — K8020 Calculus of gallbladder without cholecystitis without obstruction: Secondary | ICD-10-CM | POA: Insufficient documentation

## 2016-04-26 DIAGNOSIS — M5432 Sciatica, left side: Secondary | ICD-10-CM

## 2016-04-26 DIAGNOSIS — I10 Essential (primary) hypertension: Secondary | ICD-10-CM | POA: Insufficient documentation

## 2016-04-26 DIAGNOSIS — Z7984 Long term (current) use of oral hypoglycemic drugs: Secondary | ICD-10-CM | POA: Insufficient documentation

## 2016-04-26 DIAGNOSIS — E119 Type 2 diabetes mellitus without complications: Secondary | ICD-10-CM | POA: Insufficient documentation

## 2016-04-26 DIAGNOSIS — D259 Leiomyoma of uterus, unspecified: Secondary | ICD-10-CM | POA: Insufficient documentation

## 2016-04-26 HISTORY — DX: Type 2 diabetes mellitus without complications: E11.9

## 2016-04-26 LAB — URINALYSIS, ROUTINE W REFLEX MICROSCOPIC
Bilirubin Urine: NEGATIVE
Glucose, UA: NEGATIVE mg/dL
Hgb urine dipstick: NEGATIVE
Ketones, ur: NEGATIVE mg/dL
LEUKOCYTES UA: NEGATIVE
NITRITE: NEGATIVE
PROTEIN: NEGATIVE mg/dL
SPECIFIC GRAVITY, URINE: 1.013 (ref 1.005–1.030)
pH: 6 (ref 5.0–8.0)

## 2016-04-26 MED ORDER — TRAMADOL-ACETAMINOPHEN 37.5-325 MG PO TABS: 2.0000 | ORAL_TABLET | Freq: Four times a day (QID) | ORAL | 0 refills | Status: DC | PRN

## 2016-04-26 MED ORDER — ORPHENADRINE CITRATE ER 100 MG PO TB12: 100.0000 mg | ORAL_TABLET | Freq: Two times a day (BID) | ORAL | 0 refills | Status: DC

## 2016-04-26 NOTE — ED Notes (Signed)
Pt states she was specifically in [place] for 8 months. [place] 6 [place]. Pt drank bottled water. Pt's husband has similar symptoms off and on for years. Pt states she has had her symptoms for the past 3 days. Pt denies N/V/D.

## 2016-04-26 NOTE — ED Provider Notes (Signed)
[facility] Provider Note   CSN: [id number] Arrival date & time: 04/26/16  1250  By signing my name below, I, Jeanell Sparrow, attest that this documentation has been prepared under the direction and in the presence of Charlesetta Shanks, MD . Electronically Signed: Jeanell Sparrow, Scribe. 04/26/2016. 3:15 PM.  History   Chief Complaint Chief Complaint  Patient presents with  . Back Pain  . Abdominal Pain   The history is provided by the patient. No language interpreter was used.   HPI Comments: [name] is a 65 y.o. female who presents to the Emergency Department complaining of intermittent moderate lower back pain that started about 3 days ago. She states she recently traveled to [place] 6 weeks ago for 8 months. She reports that she had a similar complaint a few months ago while in [place], which was resolved by antibiotics. She describes the pain as a throbbing, aching sensation that radiates into her bilateral buttocks (pain is worse in this area). She reports that the pain is only onset during movement and change in position. She reports associated symptoms of flank pain, subjective fever, chills, and upper BLE pain. Pt's temperature in the ED today was 98.6. She admits to a PMHx of HTN and DM. She denies any chest pain, abdominal pain, vomiting, diarrhea, rash, or other complaints.     PCP: Minerva Ends, MD  Past Medical History:  Diagnosis Date  . Diabetes mellitus without complication ([place])   . Hypertension 2006  . Malaria 09/20/2014     Patient Active Problem List   Diagnosis Date Noted  . Diabetes type 2, controlled ([place]) 03/13/2015  . Blepharitis 03/13/2015  . 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 Bilateral cataracts 03/13/2015  . Poor vision 02/28/2015  . Hypertension 10/06/2014  . Anemia 09/30/2014  . 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 CESAREAN SECTION  1990    OB History    Gravida Para Term Preterm AB Living   3         3   SAB TAB Ectopic Multiple Live Births                   Home Medications    Prior to Admission medications   Medication Sig Start Date End Date Taking? Authorizing Provider  Blood Glucose Monitoring Suppl (TRUE METRIX METER) W/DEVICE KIT 1 each by Does not apply route as needed. 03/13/15   Josalyn Funches, MD  glimepiride (AMARYL) 2 MG tablet Take 1 tablet (2 mg total) by mouth daily before breakfast. 05/10/15   Lance Bosch, NP  glucose blood (TRUE METRIX BLOOD GLUCOSE TEST) test strip 1 each by Other route daily. 03/13/15   Josalyn Funches, MD  hydrochlorothiazide (HYDRODIURIL) 12.5 MG tablet Take 1 tablet (12.5 mg total) by mouth daily. 02/28/15   Josalyn Funches, MD  ibuprofen (ADVIL,MOTRIN) 400 MG tablet Take 400 mg by mouth every 6 (six) hours as needed.    Historical Provider, MD  Multiple Vitamins-Iron (MULTIVITAMINS WITH IRON) TABS tablet Take 1 tablet by mouth daily. 11/01/14   Josalyn Funches, MD  NIFEdipine (PROCARDIA-XL/ADALAT-CC/NIFEDICAL-XL) 30 MG 24 hr tablet Take 1 tablet (30 mg total) by mouth daily. 02/28/15   Josalyn Funches, MD  orphenadrine (NORFLEX) 100 MG tablet Take 1 tablet (100 mg total) by mouth 2 (two) times daily. 04/26/16   Charlesetta Shanks, MD  traMADol-acetaminophen (ULTRACET) 37.5-325 MG tablet Take 2 tablets by mouth every 6 (six)  hours as needed. 04/26/16   Marcy Pfeiffer, MD  TRUEPLUS LANCETS 28G MISC 1 each by Does not apply route daily. 03/13/15   Josalyn Funches, MD    Family History Family History  Problem Relation Age of Onset  . Hypertension Mother   . Diabetes Mother   . Cancer Father   . Diabetes Sister     Social History Social History  Substance Use Topics  . Smoking status: Never Smoker  . Smokeless tobacco: Never Used  . Alcohol use No     Comment: occassional     Allergies   Patient has no known allergies.   Review of Systems Review of Systems  A complete 10 system review  of systems was obtained and all systems are negative except as noted in the HPI and PMH.   Physical Exam Updated Vital Signs BP 142/82 (BP Location: Right Arm)   Pulse 63   Temp 98.6 F (37 C) (Oral)   Resp 18   SpO2 98%   Physical Exam  Constitutional: She appears well-developed and well-nourished. No distress.  HENT:  Head: Normocephalic and atraumatic.  Eyes: Conjunctivae and EOM are normal.  Neck: Neck supple.  Cardiovascular: Normal rate, regular rhythm, normal heart sounds and intact distal pulses.  Exam reveals no gallop and no friction rub.   No murmur heard. Pulmonary/Chest: Effort normal. She has no wheezes. She has no rales. She exhibits no tenderness.  Abdominal: Soft. She exhibits no distension and no mass. There is no tenderness. There is no guarding.  No CVA TTP.  Musculoskeletal: Normal range of motion.  No bony point TTP. Pain reproducible in the SI region by transition sitting to standing. BLE: No edema or calf tenderness. Normal strength, 5/5 motor strengths extension against resistance. Transitions supine to sitting to standing without difficulty.  Neurological: She is alert. No cranial nerve deficit. She exhibits normal muscle tone. Coordination normal.  Skin: Skin is warm and dry.  Psychiatric: She has a normal mood and affect.  Nursing note and vitals reviewed.    ED Treatments / Results  DIAGNOSTIC STUDIES: Oxygen Saturation is 98% on RA, normal by my interpretation.    COORDINATION OF CARE: 3:20 PM- Pt advised of plan for treatment and pt agrees.  Labs (all labs ordered are listed, but only abnormal results are displayed) Labs Reviewed  URINE CULTURE  URINALYSIS, ROUTINE W REFLEX MICROSCOPIC (NOT AT [facility])    EKG  EKG Interpretation None       Radiology Dg Lumbar Spine Complete  Result Date: 04/26/2016 CLINICAL DATA:  Low back pain EXAM: LUMBAR SPINE - COMPLETE 4+ VIEW COMPARISON:  None. FINDINGS: Scattered large and small bowel gas is  noted. Five lumbar type vertebral bodies are well visualized. Vertebral body height is well maintained. Lamellated calcification is noted in the right upper quadrant consistent with gallstone. Multiple calcified uterine fibroids are seen. Degenerative changes of the facet joints are noted. IMPRESSION: Degenerative change without acute abnormality. Cholelithiasis and calcified uterine fibroids. Electronically Signed   By: Mark  Lukens M.D.   On: 04/26/2016 15:55    Procedures Procedures (including critical care time)  Medications Ordered in ED Medications - No data to display   Initial Impression / Assessment and Plan / ED Course  I have reviewed the triage vital signs and the nursing notes.  Pertinent labs & imaging results that were available during my care of the patient were reviewed by me and considered in my medical decision making (see chart for details).    Clinical Course     Final Clinical Impressions(s) / ED Diagnoses   Final diagnoses:  Low back pain  Bilateral sciatica  Uterine leiomyoma, unspecified location  Calculus of gallbladder without cholecystitis without obstruction   Patient has incidental findings on plain film x-ray. She is made aware of noted fibroids and gallbladder stone area and she has no symptoms in conjunction with these findings. Patient is low back pain is very consistent with lumbosacral pain suggestive of sciatica with aching and cramping into her buttock. He is counseled on follow-up plan. Patient does not have any fever here. She does report some subjective feelings of fever. Clinically she is well in appearance. Review of systems does not indicate other findings suggestive of infectious illness. At this time, I have very low suspicion for infectious illnesses etiology nor any symptoms to suggest infectious illness from her travels in [place]. She was given Ultracet and Norflex for pain control. New Prescriptions New Prescriptions   ORPHENADRINE (NORFLEX)  100 MG TABLET    Take 1 tablet (100 mg total) by mouth 2 (two) times daily.   TRAMADOL-ACETAMINOPHEN (ULTRACET) 37.5-325 MG TABLET    Take 2 tablets by mouth every 6 (six) hours as needed.      Charlesetta Shanks, MD 04/26/16 [phone]

## 2016-04-26 NOTE — ED Triage Notes (Addendum)
PResents with lower back and lower abdominal pain began 3 days ago, pt returned from [place] 6 weeks ago and was healthy while in [place], she had this same problem last year and was admitted for Malaria at that time. She states the pain in the  Back is different. Deneis vaginal bleeding, dishcharge, urinary symptoms. Endorses cough that has resolved. Denies chills but reports that she has a bitterness in her mouth that she gets when she has a fever. She endorses that her grandbaby was here this morning and has same symptoms and was treated.

## 2016-04-27 LAB — URINE CULTURE: CULTURE: NO GROWTH

## 2016-05-16 ENCOUNTER — Encounter: Payer: Self-pay | Admitting: Family Medicine

## 2016-05-16 ENCOUNTER — Ambulatory Visit: Payer: Self-pay | Attending: Family Medicine | Admitting: Family Medicine

## 2016-05-16 VITALS — BP 149/77 | HR 70 | Temp 98.7°F | Ht 62.0 in | Wt 168.4 lb

## 2016-05-16 DIAGNOSIS — E785 Hyperlipidemia, unspecified: Secondary | ICD-10-CM

## 2016-05-16 DIAGNOSIS — Z79899 Other long term (current) drug therapy: Secondary | ICD-10-CM | POA: Insufficient documentation

## 2016-05-16 DIAGNOSIS — M150 Primary generalized (osteo)arthritis: Secondary | ICD-10-CM

## 2016-05-16 DIAGNOSIS — E119 Type 2 diabetes mellitus without complications: Secondary | ICD-10-CM

## 2016-05-16 DIAGNOSIS — I10 Essential (primary) hypertension: Secondary | ICD-10-CM

## 2016-05-16 DIAGNOSIS — M159 Polyosteoarthritis, unspecified: Secondary | ICD-10-CM

## 2016-05-16 DIAGNOSIS — Z23 Encounter for immunization: Secondary | ICD-10-CM

## 2016-05-16 DIAGNOSIS — Z0000 Encounter for general adult medical examination without abnormal findings: Secondary | ICD-10-CM

## 2016-05-16 DIAGNOSIS — Z1159 Encounter for screening for other viral diseases: Secondary | ICD-10-CM

## 2016-05-16 DIAGNOSIS — M545 Low back pain: Secondary | ICD-10-CM | POA: Insufficient documentation

## 2016-05-16 DIAGNOSIS — E1169 Type 2 diabetes mellitus with other specified complication: Secondary | ICD-10-CM

## 2016-05-16 LAB — COMPLETE METABOLIC PANEL WITH GFR
ALT: 12 U/L (ref 6–29)
AST: 21 U/L (ref 10–35)
Albumin: 4.5 g/dL (ref 3.6–5.1)
Alkaline Phosphatase: 79 U/L (ref 33–130)
BILIRUBIN TOTAL: 0.7 mg/dL (ref 0.2–1.2)
BUN: 12 mg/dL (ref 7–25)
CO2: 28 mmol/L (ref 20–31)
CREATININE: 0.89 mg/dL (ref 0.50–0.99)
Calcium: 10 mg/dL (ref 8.6–10.4)
Chloride: 101 mmol/L (ref 98–110)
GFR, Est African American: 79 mL/min (ref >=60)
GFR, Est Non African American: 69 mL/min (ref >=60)
GLUCOSE: 129 mg/dL — AB (ref 65–99)
Potassium: 4.2 mmol/L (ref 3.5–5.3)
SODIUM: 139 mmol/L (ref 135–146)
TOTAL PROTEIN: 8.2 g/dL — AB (ref 6.1–8.1)

## 2016-05-16 LAB — LIPID PANEL
Cholesterol: 212 mg/dL — ABNORMAL HIGH (ref <200)
HDL: 64 mg/dL (ref >50)
LDL CALC: 127 mg/dL — AB (ref <100)
Total CHOL/HDL Ratio: 3.3 Ratio (ref <5.0)
Triglycerides: 103 mg/dL (ref <150)
VLDL: 21 mg/dL (ref <30)

## 2016-05-16 LAB — CBC
HCT: 40.9 % (ref 35.0–45.0)
HEMOGLOBIN: 14 g/dL (ref 11.7–15.5)
MCH: 29.7 pg (ref 27.0–33.0)
MCHC: 34.2 g/dL (ref 32.0–36.0)
MCV: 86.7 fL (ref 80.0–100.0)
MPV: 10.9 fL (ref 7.5–12.5)
Platelets: 331 10*3/uL (ref 140–400)
RBC: 4.72 MIL/uL (ref 3.80–5.10)
RDW: 13.8 % (ref 11.0–15.0)
WBC: 5.2 10*3/uL (ref 3.8–10.8)

## 2016-05-16 LAB — GLUCOSE, POCT (MANUAL RESULT ENTRY): POC Glucose: 197 mg/dl — AB (ref 70–99)

## 2016-05-16 LAB — POCT GLYCOSYLATED HEMOGLOBIN (HGB A1C): HEMOGLOBIN A1C: 6.5

## 2016-05-16 LAB — HEPATITIS C ANTIBODY: HCV AB: NEGATIVE

## 2016-05-16 MED ORDER — HYDROCHLOROTHIAZIDE 12.5 MG PO TABS: 12.5000 mg | ORAL_TABLET | Freq: Every day | ORAL | 5 refills | Status: DC

## 2016-05-16 MED ORDER — TRUEPLUS LANCETS 28G MISC: 1.0000 | Freq: Every day | 3 refills | Status: DC

## 2016-05-16 MED ORDER — GLUCOSAMINE-CHONDROITIN 750-600 MG PO TABS: 2.0000 | ORAL_TABLET | Freq: Every day | ORAL | 0 refills | Status: AC

## 2016-05-16 MED ORDER — GLUCOSE BLOOD VI STRP: 1.0000 | ORAL_STRIP | Freq: Every day | 3 refills | Status: DC

## 2016-05-16 MED ORDER — TURMERIC 500 MG PO CAPS: 500.0000 mg | ORAL_CAPSULE | Freq: Every day | ORAL | Status: AC

## 2016-05-16 MED ORDER — NIFEDIPINE ER OSMOTIC RELEASE 30 MG PO TB24: 30.0000 mg | ORAL_TABLET | Freq: Every day | ORAL | 5 refills | Status: DC

## 2016-05-16 MED ORDER — GLIMEPIRIDE 2 MG PO TABS: 2.0000 mg | ORAL_TABLET | Freq: Every day | ORAL | 5 refills | Status: DC

## 2016-05-16 NOTE — Progress Notes (Signed)
Pt is here today to reestablish care. Pt has HTN and diabetes.  Pt is getting flu shot today.

## 2016-05-16 NOTE — Progress Notes (Signed)
Subjective:  Patient ID: [name], female    DOB: 07/30/1950  Age: 65 y.o. MRN: [id number]  CC: Hypertension and Diabetes   HPI [name] presents for    1. CHRONIC DIABETES  Disease Monitoring  Blood Sugar Ranges:   Fasting: 104-140  Polyuria: no   Visual problems: no   Medication Compliance: out of amaryl for 3 weeks   Medication Side Effects  Hypoglycemia: no   Preventitive Health Care  Eye Exam: done recently   Foot Exam: done today   Diet pattern:   Exercise: walking   2. HTN: taking HCTZ 12.5 mg daily and nifedipine 30 mg daily. No HA, CP, SOB or leg swelling.   3. Back pain: she was seen in ED for back pain last month. First had low back pain 9 months ago while in [place] following walking down steep steps. The pain lasted for 4 weeks. This past time the pain lasted for 1 week without treatment. No pain currently. Pain stayed in low back. Non-radiating.   Social History  Substance Use Topics  . Smoking status: Never Smoker  . Smokeless tobacco: Never Used  . Alcohol use No     Comment: occassional    Outpatient Medications Prior to Visit  Medication Sig Dispense Refill  . Blood Glucose Monitoring Suppl (TRUE METRIX METER) W/DEVICE KIT 1 each by Does not apply route as needed. 1 kit 0  . glimepiride (AMARYL) 2 MG tablet Take 1 tablet (2 mg total) by mouth daily before breakfast. 90 tablet 1  . glucose blood (TRUE METRIX BLOOD GLUCOSE TEST) test strip 1 each by Other route daily. 100 each 2  . hydrochlorothiazide (HYDRODIURIL) 12.5 MG tablet Take 1 tablet (12.5 mg total) by mouth daily. 30 tablet 5  . ibuprofen (ADVIL,MOTRIN) 400 MG tablet Take 400 mg by mouth every 6 (six) hours as needed.    . Multiple Vitamins-Iron (MULTIVITAMINS WITH IRON) TABS tablet Take 1 tablet by mouth daily. 30 tablet 5  . NIFEdipine (PROCARDIA-XL/ADALAT-CC/NIFEDICAL-XL) 30 MG 24 hr tablet Take 1 tablet (30 mg total) by mouth daily. 30 tablet 5  . orphenadrine (NORFLEX) 100  MG tablet Take 1 tablet (100 mg total) by mouth 2 (two) times daily. 30 tablet 0  . traMADol-acetaminophen (ULTRACET) 37.5-325 MG tablet Take 2 tablets by mouth every 6 (six) hours as needed. 30 tablet 0  . TRUEPLUS LANCETS 28G MISC 1 each by Does not apply route daily. 100 each 2   No facility-administered medications prior to visit.     ROS Review of Systems  Constitutional: Negative for chills and fever.  Eyes: Negative for visual disturbance.  Respiratory: Negative for shortness of breath.   Cardiovascular: Negative for chest pain.  Gastrointestinal: Negative for abdominal pain and blood in stool.  Musculoskeletal: Positive for back pain and joint swelling.  Skin: Negative for rash.  Allergic/Immunologic: Negative for immunocompromised state.  Hematological: Negative for adenopathy. Does not bruise/bleed easily.  Psychiatric/Behavioral: Negative for dysphoric mood and suicidal ideas.    Objective:  BP (!) 149/77 (BP Location: Left Arm, Patient Position: Sitting, Cuff Size: Small)   Pulse 70   Temp 98.7 F (37.1 C) (Oral)   Ht '5\' 2"'$  (1.575 m)   Wt 168 lb 6.4 oz (76.4 kg)   SpO2 97%   BMI 30.80 kg/m   BP/Weight 05/16/2016 04/26/2016 02/02/2670  Systolic BP 245 809 983  Diastolic BP 77 78 82  Wt. (Lbs) 168.4 - 168  BMI 30.8 - 30.72  Physical Exam  Constitutional: She is oriented to person, place, and time. She appears well-developed and well-nourished. No distress.  HENT:  Head: Normocephalic and atraumatic.  Cardiovascular: Normal rate, regular rhythm, normal heart sounds and intact distal pulses.   Pulmonary/Chest: Effort normal and breath sounds normal.  Musculoskeletal: She exhibits no edema.       Feet:  Neurological: She is alert and oriented to person, place, and time.  Skin: Skin is warm and dry. No rash noted.  Psychiatric: She has a normal mood and affect.    Lab Results  Component Value Date   HGBA1C 7.7 03/13/2015   Lab Results  Component  Value Date   HGBA1C 6.5 05/16/2016    CBG 197 Assessment & Plan:  [name] was seen today for hypertension and diabetes.  Diagnoses and all orders for this visit:  Controlled type 2 diabetes mellitus without complication, without long-term current use of insulin (HCC) -     POCT glucose (manual entry) -     HgB A1c -     Microalbumin/Creatinine Ratio, Urine -     COMPLETE METABOLIC PANEL WITH GFR -     Lipid Panel -     glimepiride (AMARYL) 2 MG tablet; Take 1 tablet (2 mg total) by mouth daily before breakfast.  Need for hepatitis C screening test -     Hepatitis C antibody, reflex  Essential hypertension -     hydrochlorothiazide (HYDRODIURIL) 12.5 MG tablet; Take 1 tablet (12.5 mg total) by mouth daily. -     NIFEdipine (PROCARDIA-XL/ADALAT-CC/NIFEDICAL-XL) 30 MG 24 hr tablet; Take 1 tablet (30 mg total) by mouth daily.   There are no diagnoses linked to this encounter.  No orders of the defined types were placed in this encounter.   Follow-up: No Follow-up on file.   Boykin Nearing MD

## 2016-05-16 NOTE — Assessment & Plan Note (Signed)
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

## 2016-05-16 NOTE — Assessment & Plan Note (Signed)
Slight elevation Patient declines increase in med dose Continue HCTZ 12.5 mg and procardia 30 mg daily

## 2016-05-16 NOTE — Assessment & Plan Note (Signed)
Controlled Restart amaryl

## 2016-05-16 NOTE — Addendum Note (Signed)
Addended by: Boykin Nearing on: 05/16/2016 09:43 AM   Modules accepted: Orders

## 2016-05-16 NOTE — Patient Instructions (Addendum)
[name] was seen today for hypertension and diabetes.  Diagnoses and all orders for this visit:  Controlled type 2 diabetes mellitus without complication, without long-term current use of insulin (HCC) -     POCT glucose (manual entry) -     HgB A1c -     Microalbumin/Creatinine Ratio, Urine -     COMPLETE METABOLIC PANEL WITH GFR -     Lipid Panel -     glimepiride (AMARYL) 2 MG tablet; Take 1 tablet (2 mg total) by mouth daily before breakfast.  Need for hepatitis C screening test -     Hepatitis C antibody, reflex  Essential hypertension -     hydrochlorothiazide (HYDRODIURIL) 12.5 MG tablet; Take 1 tablet (12.5 mg total) by mouth daily. -     NIFEdipine (PROCARDIA-XL/ADALAT-CC/NIFEDICAL-XL) 30 MG 24 hr tablet; Take 1 tablet (30 mg total) by mouth daily.  Primary osteoarthritis involving multiple joints -     Glucosamine-Chondroitin 750-600 MG TABS; Take 2 tablets by mouth daily. -     Turmeric 500 MG CAPS; Take 500 mg by mouth daily.   Be sure to keep a yearly check on your eyes GI referral placed to discuss colonoscopy  F/u in 6 months for diabetes and HTN  Dr. Adrian Blackwater    Colonoscopy, Adult A colonoscopy is an exam to look at the entire large intestine. During the exam, a lubricated, bendable tube is inserted into the anus and then passed into the rectum, colon, and other parts of the large intestine. A colonoscopy is often done as a part of normal colorectal screening or in response to certain symptoms, such as anemia, persistent diarrhea, abdominal pain, and blood in the stool. The exam can help screen for and diagnose medical problems, including:  Tumors.  Polyps.  Inflammation.  Areas of bleeding. Tell a health care provider about:  Any allergies you have.  All medicines you are taking, including vitamins, herbs, eye drops, creams, and over-the-counter medicines.  Any problems you or family members have had with anesthetic medicines.  Any blood disorders  you have.  Any surgeries you have had.  Any medical conditions you have.  Any problems you have had passing stool. What are the risks? Generally, this is a safe procedure. However, problems may occur, including:  Bleeding.  A tear in the intestine.  A reaction to medicines given during the exam.  Infection (rare). What happens before the procedure? Eating and drinking restrictions  Follow instructions from your health care provider about eating and drinking, which may include:  A few days before the procedure - follow a low-fiber diet. Avoid nuts, seeds, dried fruit, raw fruits, and vegetables.  1-3 days before the procedure - follow a clear liquid diet. Drink only clear liquids, such as clear broth or bouillon, black coffee or tea, clear juice, clear soft drinks or sports drinks, gelatin desert, and popsicles. Avoid any liquids that contain red or purple dye.  On the day of the procedure - do not eat or drink anything during the 2 hours before the procedure, or within the time period that your health care provider recommends. Bowel prep  If you were prescribed an oral bowel prep to clean out your colon:  Take it as told by your health care provider. Starting the day before your procedure, you will need to drink a large amount of medicated liquid. The liquid will cause you to have multiple loose stools until your stool is almost clear or light green.  If  your skin or anus gets irritated from diarrhea, you may use these to relieve the irritation:  Medicated wipes, such as adult wet wipes with aloe and vitamin E.  A skin soothing-product like petroleum jelly.  If you vomit while drinking the bowel prep, take a break for up to 60 minutes and then begin the bowel prep again. If vomiting continues and you cannot take the bowel prep without vomiting, call your health care provider. General instructions  Ask your health care provider about changing or stopping your regular medicines.  This is especially important if you are taking diabetes medicines or blood thinners.  Plan to have someone take you home from the hospital or clinic. What happens during the procedure?  An IV tube may be inserted into one of your veins.  You will be given medicine to help you relax (sedative).  To reduce your risk of infection:  Your health care team will wash or sanitize their hands.  Your anal area will be washed with soap.  You will be asked to lie on your side with your knees bent.  Your health care provider will lubricate a long, thin, flexible tube. The tube will have a camera and a light on the end.  The tube will be inserted into your anus.  The tube will be gently eased through your rectum and colon.  Air will be delivered into your colon to keep it open. You may feel some pressure or cramping.  The camera will be used to take images during the procedure.  A small tissue sample may be removed from your body to be examined under a microscope (biopsy). If any potential problems are found, the tissue will be sent to a lab for testing.  If small polyps are found, your health care provider may remove them and have them checked for cancer cells.  The tube that was inserted into your anus will be slowly removed. The procedure may vary among health care providers and hospitals. What happens after the procedure?  Your blood pressure, heart rate, breathing rate, and blood oxygen level will be monitored until the medicines you were given have worn off.  Do not drive for 24 hours after the exam.  You may have a small amount of blood in your stool.  You may pass gas and have mild abdominal cramping or bloating due to the air that was used to inflate your colon during the exam.  It is up to you to get the results of your procedure. Ask your health care provider, or the department performing the procedure, when your results will be ready. This information is not intended to  replace advice given to you by your health care provider. Make sure you discuss any questions you have with your health care provider. Document Released: 05/16/2000 Document Revised: 12/07/2015 Document Reviewed: 07/31/2015 Elsevier Interactive Patient Education  2017 [employer].

## 2016-05-17 LAB — MICROALBUMIN / CREATININE URINE RATIO
CREATININE, URINE: 29 mg/dL (ref 20–320)
Microalb, Ur: <0.2 mg/dL

## 2016-05-18 DIAGNOSIS — E785 Hyperlipidemia, unspecified: Secondary | ICD-10-CM

## 2016-05-18 DIAGNOSIS — E1169 Type 2 diabetes mellitus with other specified complication: Secondary | ICD-10-CM | POA: Insufficient documentation

## 2016-05-18 MED ORDER — ATORVASTATIN CALCIUM 40 MG PO TABS: 40.0000 mg | ORAL_TABLET | Freq: Every day | ORAL | 11 refills | Status: DC

## 2016-05-18 MED ORDER — ASPIRIN EC 81 MG PO TBEC: 81.0000 mg | DELAYED_RELEASE_TABLET | Freq: Every day | ORAL | 11 refills | Status: DC

## 2016-05-18 NOTE — Addendum Note (Signed)
Addended by: Boykin Nearing on: 05/18/2016 12:08 PM   Modules accepted: Orders

## 2016-05-18 NOTE — Assessment & Plan Note (Signed)
Start lipitor 40  mg daily and aspirin 81 mg daily to reduce risk of heart disease and stroke and lower cholesterol.

## 2016-05-21 ENCOUNTER — Telehealth: Payer: Self-pay

## 2016-05-21 NOTE — Telephone Encounter (Signed)
Pt was called and informed of lab results. 

## 2016-10-15 ENCOUNTER — Encounter: Payer: Self-pay | Admitting: Family Medicine

## 2016-11-05 ENCOUNTER — Other Ambulatory Visit: Payer: Self-pay | Admitting: Family Medicine

## 2016-11-05 DIAGNOSIS — E119 Type 2 diabetes mellitus without complications: Secondary | ICD-10-CM

## 2016-11-05 DIAGNOSIS — I10 Essential (primary) hypertension: Secondary | ICD-10-CM

## 2016-12-29 ENCOUNTER — Encounter: Payer: Self-pay | Admitting: Family Medicine

## 2016-12-29 ENCOUNTER — Ambulatory Visit: Payer: Self-pay | Attending: Family Medicine | Admitting: Family Medicine

## 2016-12-29 VITALS — BP 136/80 | HR 72 | Temp 98.5°F | Ht 62.0 in | Wt 161.0 lb

## 2016-12-29 DIAGNOSIS — E1169 Type 2 diabetes mellitus with other specified complication: Secondary | ICD-10-CM

## 2016-12-29 DIAGNOSIS — E119 Type 2 diabetes mellitus without complications: Secondary | ICD-10-CM | POA: Insufficient documentation

## 2016-12-29 DIAGNOSIS — Z7984 Long term (current) use of oral hypoglycemic drugs: Secondary | ICD-10-CM | POA: Insufficient documentation

## 2016-12-29 DIAGNOSIS — Z7982 Long term (current) use of aspirin: Secondary | ICD-10-CM | POA: Insufficient documentation

## 2016-12-29 DIAGNOSIS — E785 Hyperlipidemia, unspecified: Secondary | ICD-10-CM | POA: Insufficient documentation

## 2016-12-29 DIAGNOSIS — Z79899 Other long term (current) drug therapy: Secondary | ICD-10-CM | POA: Insufficient documentation

## 2016-12-29 DIAGNOSIS — I10 Essential (primary) hypertension: Secondary | ICD-10-CM | POA: Insufficient documentation

## 2016-12-29 LAB — GLUCOSE, POCT (MANUAL RESULT ENTRY): POC Glucose: 107 mg/dl — AB (ref 70–99)

## 2016-12-29 MED ORDER — NIFEDIPINE ER OSMOTIC RELEASE 30 MG PO TB24: 30.0000 mg | ORAL_TABLET | Freq: Every day | ORAL | 11 refills | Status: DC

## 2016-12-29 MED ORDER — ASPIRIN EC 81 MG PO TBEC: 81.0000 mg | DELAYED_RELEASE_TABLET | Freq: Every day | ORAL | 11 refills | Status: DC

## 2016-12-29 MED ORDER — ATORVASTATIN CALCIUM 40 MG PO TABS: 40.0000 mg | ORAL_TABLET | Freq: Every day | ORAL | 11 refills | Status: DC

## 2016-12-29 MED ORDER — GLIMEPIRIDE 2 MG PO TABS: ORAL_TABLET | ORAL | 11 refills | Status: DC

## 2016-12-29 MED ORDER — HYDROCHLOROTHIAZIDE 12.5 MG PO CAPS: 12.5000 mg | ORAL_CAPSULE | Freq: Every day | ORAL | 11 refills | Status: DC

## 2016-12-29 MED ORDER — GLUCOSE BLOOD VI STRP: 1.0000 | ORAL_STRIP | Freq: Every day | 3 refills | Status: DC

## 2016-12-29 MED ORDER — TRUEPLUS LANCETS 28G MISC: 1.0000 | Freq: Every day | 3 refills | Status: DC

## 2016-12-29 NOTE — Progress Notes (Signed)
Subjective:  Patient ID: [name], female    DOB: [birth year]  Age: 66 y.o. MRN: [id number]  CC: Hypertension and Diabetes   HPI Everline Mahaffy presents for    1. CHRONIC DIABETES  Disease Monitoring  Blood Sugar Ranges:   Fasting:  88-114  Polyuria: no   Visual problems: no   Medication Compliance: yes,  Amaryl 2 mg daily  Medication Side Effects  Hypoglycemia: no   Preventitive Health Care  Eye Exam: done recently   Foot Exam: done today   Diet pattern:   Exercise: walking   2. HTN: taking HCTZ 12.5 mg daily and nifedipine 30 mg daily. No HA, CP, SOB or leg swelling.  Reports BP is 130s/80s outside of the office. She reports eating healthy low salt. She traveled to [place] in [place] for 2 months and reports her BP was lower there.   Social History  Substance Use Topics  . Smoking status: Never Smoker  . Smokeless tobacco: Never Used  . Alcohol use No     Comment: occassional    Outpatient Medications Prior to Visit  Medication Sig Dispense Refill  . atorvastatin (LIPITOR) 40 MG tablet Take 1 tablet (40 mg total) by mouth daily. 30 tablet 11  . Blood Glucose Monitoring Suppl (TRUE METRIX METER) W/DEVICE KIT 1 each by Does not apply route as needed. 1 kit 0  . glimepiride (AMARYL) 2 MG tablet TAKE 1 TABLET BY MOUTH DAILY BEFORE BREAKFAST. 30 tablet 0  . Glucosamine-Chondroitin 750-600 MG TABS Take 2 tablets by mouth daily.  0  . glucose blood (TRUE METRIX BLOOD GLUCOSE TEST) test strip 1 each by Other route daily. 100 each 3  . hydrochlorothiazide (MICROZIDE) 12.5 MG capsule TAKE ONE CAPSULE BY MOUTH DAILY 30 capsule 0  . ibuprofen (ADVIL,MOTRIN) 400 MG tablet Take 400 mg by mouth every 6 (six) hours as needed.    Marland Kitchen NIFEdipine (PROCARDIA-XL/ADALAT-CC/NIFEDICAL-XL) 30 MG 24 hr tablet TAKE 1 TABLET BY MOUTH DAILY. 30 tablet 0  . TRUEPLUS LANCETS 28G MISC 1 each by Does not apply route daily. 100 each 3  . Turmeric 500 MG CAPS Take 500 mg by mouth daily.    Marland Kitchen  aspirin EC 81 MG tablet Take 1 tablet (81 mg total) by mouth daily. (Patient not taking: Reported on 12/29/2016) 30 tablet 11   No facility-administered medications prior to visit.     ROS Review of Systems  Constitutional: Negative for chills and fever.  Eyes: Negative for visual disturbance.  Respiratory: Negative for shortness of breath.   Cardiovascular: Negative for chest pain.  Gastrointestinal: Negative for abdominal pain and blood in stool.  Musculoskeletal: Positive for back pain and joint swelling.  Skin: Negative for rash.  Allergic/Immunologic: Negative for immunocompromised state.  Hematological: Negative for adenopathy. Does not bruise/bleed easily.  Psychiatric/Behavioral: Negative for dysphoric mood and suicidal ideas.    Objective:  BP 136/80   Pulse 72   Temp 98.5 F (36.9 C) (Oral)   Ht '5\' 2"'$  (1.575 m)   Wt 161 lb (73 kg)   SpO2 96%   BMI 29.45 kg/m   BP/Weight 12/29/2016 05/16/2016 24/58/0998  Systolic BP 338 250 539  Diastolic BP 81 77 78  Wt. (Lbs) 161 168.4 -  BMI 29.45 30.8 -     Physical Exam  Constitutional: She is oriented to person, place, and time. She appears well-developed and well-nourished. No distress.  HENT:  Head: Normocephalic and atraumatic.  Neck: Carotid bruit is not present.  Cardiovascular:  Normal rate, regular rhythm, normal heart sounds and intact distal pulses.   Pulmonary/Chest: Effort normal and breath sounds normal.  Musculoskeletal: She exhibits no edema.  Neurological: She is alert and oriented to person, place, and time.  Skin: Skin is warm and dry. No rash noted.  Psychiatric: She has a normal mood and affect.    Lab Results  Component Value Date   HGBA1C 6.5 05/16/2016    CBG 107 Assessment & Plan:  [name] was seen today for hypertension and diabetes.  Diagnoses and all orders for this visit:  Controlled type 2 diabetes mellitus without complication, without long-term current use of insulin (HCC) -      POCT glucose (manual entry) -     Hemoglobin A1c -     Lipid Panel -     glimepiride (AMARYL) 2 MG tablet; TAKE 1 TABLET BY MOUTH DAILY BEFORE BREAKFAST. -     glucose blood (TRUE METRIX BLOOD GLUCOSE TEST) test strip; 1 each by Other route daily. -     TRUEPLUS LANCETS 28G MISC; 1 each by Does not apply route daily.  Hyperlipidemia associated with type 2 diabetes mellitus (HCC) -     Lipid Panel -     aspirin EC 81 MG tablet; Take 1 tablet (81 mg total) by mouth daily. -     atorvastatin (LIPITOR) 40 MG tablet; Take 1 tablet (40 mg total) by mouth daily.  Essential hypertension -     hydrochlorothiazide (MICROZIDE) 12.5 MG capsule; Take 1 capsule (12.5 mg total) by mouth daily. -     NIFEdipine (PROCARDIA-XL/ADALAT-CC/NIFEDICAL-XL) 30 MG 24 hr tablet; Take 1 tablet (30 mg total) by mouth daily. -     CMP14+EGFR   There are no diagnoses linked to this encounter.  No orders of the defined types were placed in this encounter.   Follow-up: Return in about 8 weeks (around 02/23/2017) for flu shot.   Boykin Nearing MD

## 2016-12-29 NOTE — Patient Instructions (Addendum)
[name] was seen today for hypertension and diabetes.  Diagnoses and all orders for this visit:  Controlled type 2 diabetes mellitus without complication, without long-term current use of insulin (HCC) -     POCT glucose (manual entry) -     Hemoglobin A1c -     Lipid Panel -     glimepiride (AMARYL) 2 MG tablet; TAKE 1 TABLET BY MOUTH DAILY BEFORE BREAKFAST. -     glucose blood (TRUE METRIX BLOOD GLUCOSE TEST) test strip; 1 each by Other route daily. -     TRUEPLUS LANCETS 28G MISC; 1 each by Does not apply route daily.  Hyperlipidemia associated with type 2 diabetes mellitus (HCC) -     Lipid Panel -     aspirin EC 81 MG tablet; Take 1 tablet (81 mg total) by mouth daily. -     atorvastatin (LIPITOR) 40 MG tablet; Take 1 tablet (40 mg total) by mouth daily.  Essential hypertension -     hydrochlorothiazide (MICROZIDE) 12.5 MG capsule; Take 1 capsule (12.5 mg total) by mouth daily. -     NIFEdipine (PROCARDIA-XL/ADALAT-CC/NIFEDICAL-XL) 30 MG 24 hr tablet; Take 1 tablet (30 mg total) by mouth daily.  Diabetes blood sugar goals  Fasting (in AM before breakfast, 8 hrs of no eating or drinking (except water or unsweetened coffee or tea): 90-110 2 hrs after meals: < 160,   No low sugars: nothing < 70   For free colon cancer screening: Please complete home stool specimen collection and mail the kit. This is free screening.  You will receive at $10 gift card once the results are obtained.    F/u in 8 weeks for flu shot   F/u in 3 months for diabetes and HTN   Dr. Funches  

## 2016-12-29 NOTE — Assessment & Plan Note (Signed)
Well controlled per CBG review  Plan: A1c ordered for send out Continue amaryl 2 mg daily lipitor 40 mg daily Aspirin 81 mg daily

## 2016-12-29 NOTE — Assessment & Plan Note (Signed)
A; elevated above goal. We discussed that the goal is now 120-130/80  She declines medication dose change.  P: Monitor BP at home recommended omniron BP monitor $40 at [facility]

## 2016-12-30 ENCOUNTER — Telehealth: Payer: Self-pay

## 2016-12-30 LAB — LIPID PANEL
CHOL/HDL RATIO: 1.8 ratio (ref 0.0–4.4)
CHOLESTEROL TOTAL: 116 mg/dL (ref 100–199)
HDL: 65 mg/dL (ref >39)
LDL CALC: 44 mg/dL (ref 0–99)
Triglycerides: 35 mg/dL (ref 0–149)
VLDL CHOLESTEROL CAL: 7 mg/dL (ref 5–40)

## 2016-12-30 LAB — CMP14+EGFR
ALK PHOS: 88 IU/L (ref 39–117)
ALT: 16 IU/L (ref 0–32)
AST: 26 IU/L (ref 0–40)
Albumin/Globulin Ratio: 1.4 (ref 1.2–2.2)
Albumin: 4.5 g/dL (ref 3.6–4.8)
BUN/Creatinine Ratio: 14 (ref 12–28)
BUN: 13 mg/dL (ref 8–27)
Bilirubin Total: 0.7 mg/dL (ref 0.0–1.2)
CHLORIDE: 100 mmol/L (ref 96–106)
CO2: 27 mmol/L (ref 20–29)
CREATININE: 0.9 mg/dL (ref 0.57–1.00)
Calcium: 10.5 mg/dL — ABNORMAL HIGH (ref 8.7–10.3)
GFR, EST AFRICAN AMERICAN: 78 mL/min/{1.73_m2} (ref >59)
GFR, EST NON AFRICAN AMERICAN: 67 mL/min/{1.73_m2} (ref >59)
GLUCOSE: 69 mg/dL (ref 65–99)
Globulin, Total: 3.3 g/dL (ref 1.5–4.5)
Potassium: 4.4 mmol/L (ref 3.5–5.2)
Sodium: 141 mmol/L (ref 134–144)
Total Protein: 7.8 g/dL (ref 6.0–8.5)

## 2016-12-30 LAB — HEMOGLOBIN A1C
Est. average glucose Bld gHb Est-mCnc: 123 mg/dL
Hgb A1c MFr Bld: 5.9 % — ABNORMAL HIGH (ref 4.8–5.6)

## 2016-12-30 NOTE — Telephone Encounter (Signed)
Pt was called and informed of lab results. 

## 2017-02-03 ENCOUNTER — Other Ambulatory Visit: Payer: Self-pay | Admitting: Family Medicine

## 2017-02-03 DIAGNOSIS — I10 Essential (primary) hypertension: Secondary | ICD-10-CM

## 2017-02-03 DIAGNOSIS — E119 Type 2 diabetes mellitus without complications: Secondary | ICD-10-CM

## 2017-02-16 ENCOUNTER — Ambulatory Visit: Payer: Self-pay

## 2017-04-03 ENCOUNTER — Ambulatory Visit: Payer: Self-pay | Attending: Internal Medicine

## 2017-04-03 ENCOUNTER — Ambulatory Visit: Payer: Self-pay | Attending: Internal Medicine | Admitting: Internal Medicine

## 2017-04-03 ENCOUNTER — Encounter: Payer: Self-pay | Admitting: Internal Medicine

## 2017-04-03 VITALS — BP 145/78 | HR 65 | Temp 97.8°F | Resp 18 | Ht 62.0 in | Wt 155.0 lb

## 2017-04-03 DIAGNOSIS — E1136 Type 2 diabetes mellitus with diabetic cataract: Secondary | ICD-10-CM | POA: Insufficient documentation

## 2017-04-03 DIAGNOSIS — Z23 Encounter for immunization: Secondary | ICD-10-CM | POA: Insufficient documentation

## 2017-04-03 DIAGNOSIS — E785 Hyperlipidemia, unspecified: Secondary | ICD-10-CM | POA: Insufficient documentation

## 2017-04-03 DIAGNOSIS — E119 Type 2 diabetes mellitus without complications: Secondary | ICD-10-CM

## 2017-04-03 DIAGNOSIS — Z1231 Encounter for screening mammogram for malignant neoplasm of breast: Secondary | ICD-10-CM

## 2017-04-03 DIAGNOSIS — Z7982 Long term (current) use of aspirin: Secondary | ICD-10-CM | POA: Insufficient documentation

## 2017-04-03 DIAGNOSIS — Z1211 Encounter for screening for malignant neoplasm of colon: Secondary | ICD-10-CM

## 2017-04-03 DIAGNOSIS — I10 Essential (primary) hypertension: Secondary | ICD-10-CM | POA: Insufficient documentation

## 2017-04-03 DIAGNOSIS — Z79899 Other long term (current) drug therapy: Secondary | ICD-10-CM | POA: Insufficient documentation

## 2017-04-03 DIAGNOSIS — M1991 Primary osteoarthritis, unspecified site: Secondary | ICD-10-CM | POA: Insufficient documentation

## 2017-04-03 DIAGNOSIS — E1169 Type 2 diabetes mellitus with other specified complication: Secondary | ICD-10-CM

## 2017-04-03 DIAGNOSIS — Z1239 Encounter for other screening for malignant neoplasm of breast: Secondary | ICD-10-CM

## 2017-04-03 DIAGNOSIS — Z8669 Personal history of other diseases of the nervous system and sense organs: Secondary | ICD-10-CM

## 2017-04-03 LAB — GLUCOSE, POCT (MANUAL RESULT ENTRY): POC Glucose: 118 mg/dl — AB (ref 70–99)

## 2017-04-03 MED ORDER — GLIMEPIRIDE 2 MG PO TABS: ORAL_TABLET | ORAL | 11 refills | Status: DC

## 2017-04-03 NOTE — Progress Notes (Signed)
Patient just took her medication this morning.  She is here to re-establish care for diabetes and hypertension.

## 2017-04-03 NOTE — Progress Notes (Signed)
Patient ID: [name], female    DOB: [birth year]  MRN: [id number]  CC: Establish Care   Subjective: [name] is a 66 y.o. female who presents for chronic disease management.  Patient last saw seen by Dr. Adrian Blackwater 11/2016 Her concerns today include:  Patient with history of DM, HTN, HL, OA.  1. HTN: checks BP but she gets nervous when she does.  Gives range 130-140s/80s -she limits salt  -uses eliptical almost daily for past mth. Prior to this she was walking. Uses to have pain LT shoulder but better since using machine. -no CP/SOB/LE edema  2. DM: checking BS daily. Range 120-140. One low into 70s -Med: compliant Eating habits - doing well. Incorporates veggies, fruits. Drinks OJ diluted with water.  Loss 6 lbs since last visit which she states is intentional with the exercise she has been doing and better eating habits.  Due for DM eye exam. Hx of cataract. Would like to see eye doctor  3. HL: compliant with Lipitor  HM: needs eye exam, MMG and Prevnar 13/flu shot.  She has Cologuard test with her today which she has completed and needs to drop in mail  Patient Active Problem List   Diagnosis Date Noted  . Immunization due 04/03/2017  . Hyperlipidemia associated with type 2 diabetes mellitus ([place]) 05/18/2016  . Primary osteoarthritis involving multiple joints 05/16/2016  . Diabetes type 2, controlled ([place]) 03/13/2015  . 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 Bilateral cataracts 03/13/2015  . Poor vision 02/28/2015  . Hypertension 10/06/2014     Current Outpatient Prescriptions on File Prior to Visit  Medication Sig Dispense Refill  . atorvastatin (LIPITOR) 40 MG tablet Take 1 tablet (40 mg total) by mouth daily. 30 tablet 11  . Blood Glucose Monitoring Suppl (TRUE METRIX METER) W/DEVICE KIT 1 each by Does not apply route as needed. 1 kit 0  . Glucosamine-Chondroitin 750-600 MG TABS Take 2 tablets by mouth daily.  0  . glucose blood (TRUE METRIX BLOOD GLUCOSE TEST)  test strip 1 each by Other route daily. 100 each 3  . hydrochlorothiazide (MICROZIDE) 12.5 MG capsule Take 1 capsule (12.5 mg total) by mouth daily. 30 capsule 11  . ibuprofen (ADVIL,MOTRIN) 400 MG tablet Take 400 mg by mouth every 6 (six) hours as needed.    Marland Kitchen NIFEdipine (PROCARDIA-XL/ADALAT-CC/NIFEDICAL-XL) 30 MG 24 hr tablet Take 1 tablet (30 mg total) by mouth daily. 30 tablet 11  . TRUEPLUS LANCETS 28G MISC 1 each by Does not apply route daily. 100 each 3  . Turmeric 500 MG CAPS Take 500 mg by mouth daily.    Marland Kitchen aspirin EC 81 MG tablet Take 1 tablet (81 mg total) by mouth daily. (Patient not taking: Reported on 04/03/2017) 30 tablet 11   No current facility-administered medications on file prior to visit.     No Known Allergies  Social History   Social History  . Marital status: Married    Spouse name: N/A  . Number of children: 3  . Years of education: 12    Occupational History  . Not on file.   Social History Main Topics  . Smoking status: Never Smoker  . Smokeless tobacco: Never Used  . Alcohol use No     Comment: occassional  . Drug use: No  . Sexual activity: Yes    Birth control/ protection: None   Other Topics Concern  . Not on file   Social History Narrative   From [place]  Moved to [place] on 09/11/2014      Has 3 children- 1 daughter in [place],    1 child in [place]       Family History  Problem Relation Age of Onset  . Hypertension Mother   . Diabetes Mother   . Cancer Father   . Diabetes Sister     Past Surgical History:  Procedure Laterality Date  . CESAREAN SECTION  1990    ROS: Review of Systems Neg except as above  PHYSICAL EXAM: BP (!) 145/78 (BP Location: Left Arm, Patient Position: Sitting, Cuff Size: Large)   Pulse 65   Temp 97.8 F (36.6 C) (Oral)   Resp 18   Ht '5\' 2"'$  (1.575 m)   Wt 155 lb (70.3 kg)   SpO2 95%   BMI 28.35 kg/m   Wt Readings from Last 3 Encounters:  04/03/17 155 lb (70.3 kg)  12/29/16 161 lb (73 kg)  05/16/16 168  lb 6.4 oz (76.4 kg)   Physical Exam  General appearance - alert, well appearing, and in no distress Mental status - alert, oriented to person, place, and time, normal mood, behavior, speech, dress, motor activity, and thought processes Eyes - pink conjunctiva Mouth - mucous membranes moist, pharynx normal without lesions Neck - supple, no significant adenopathy Chest - clear to auscultation, no wheezes, rales or rhonchi, symmetric air entry Heart - normal rate, regular rhythm, normal S1, S2, no murmurs, rubs, clicks or gallops Extremities - peripheral pulses normal, no pedal edema, no clubbing or cyanosis Skin - hyperpigmentation of ears and dry skin around neck. (pt denies hx of eczema. States she use to use cream in [place] to lighten the skin that led to scarring).   Results for orders placed or performed in visit on 12/29/16  Hemoglobin A1c  Result Value Ref Range   Hgb A1c MFr Bld 5.9 (H) 4.8 - 5.6 %   Est. average glucose Bld gHb Est-mCnc 123 mg/dL  Lipid Panel  Result Value Ref Range   Cholesterol, Total 116 100 - 199 mg/dL   Triglycerides 35 0 - 149 mg/dL   HDL 65 >39 mg/dL   VLDL Cholesterol Cal 7 5 - 40 mg/dL   LDL Calculated 44 0 - 99 mg/dL   Chol/HDL Ratio 1.8 0.0 - 4.4 ratio  CMP14+EGFR  Result Value Ref Range   Glucose 69 65 - 99 mg/dL   BUN 13 8 - 27 mg/dL   Creatinine, Ser 0.90 0.57 - 1.00 mg/dL   GFR calc non Af Amer 67 >59 mL/min/1.73   GFR calc Af Amer 78 >59 mL/min/1.73   BUN/Creatinine Ratio 14 12 - 28   Sodium 141 134 - 144 mmol/L   Potassium 4.4 3.5 - 5.2 mmol/L   Chloride 100 96 - 106 mmol/L   CO2 27 20 - 29 mmol/L   Calcium 10.5 (H) 8.7 - 10.3 mg/dL   Total Protein 7.8 6.0 - 8.5 g/dL   Albumin 4.5 3.6 - 4.8 g/dL   Globulin, Total 3.3 1.5 - 4.5 g/dL   Albumin/Globulin Ratio 1.4 1.2 - 2.2   Bilirubin Total 0.7 0.0 - 1.2 mg/dL   Alkaline Phosphatase 88 39 - 117 IU/L   AST 26 0 - 40 IU/L   ALT 16 0 - 32 IU/L  POCT glucose (manual entry)  Result  Value Ref Range   POC Glucose 107 (A) 70 - 99 mg/dl    ASSESSMENT AND PLAN: 1. Controlled type 2 diabetes mellitus without complication, without long-term  current use of insulin ([place]) -controlled. Continue Amaryl, healthy eating and regular exercise -she has had some intentional wgh loss - POCT glucose (manual entry) - glimepiride (AMARYL) 2 MG tablet; TAKE 1 TABLET BY MOUTH DAILY BEFORE BREAKFAST.  Dispense: 30 tablet; Refill: 11  2. Essential hypertension -close to goal. She has a little bit of white coat HTN. Continue current meds.  DASH diet discussed  3. Hyperlipidemia associated with type 2 diabetes mellitus ([place]) -continue Lipitor  4. Need for Streptococcus pneumoniae and influenza vaccination - Flu Vaccine QUAD 6+ mos PF IM (Fluarix Quad PF)  5. Breast cancer screening - MM Digital Screening; Future  6. Colon cancer screening Pt to mail in cologuard test 7. History of cataract - Ambulatory referral to Ophthalmology  Patient was given the opportunity to ask questions.  Patient verbalized understanding of the plan and was able to repeat key elements of the plan.   Orders Placed This Encounter  Procedures  . MM Digital Screening  . Flu Vaccine QUAD 6+ mos PF IM (Fluarix Quad PF)  . Pneumococcal conjugate vaccine 13-valent  . Ambulatory referral to Ophthalmology  . POCT glucose (manual entry)     Requested Prescriptions   Signed Prescriptions Disp Refills  . glimepiride (AMARYL) 2 MG tablet 30 tablet 11    Sig: TAKE 1 TABLET BY MOUTH DAILY BEFORE BREAKFAST.    Return in about 3 months (around 07/04/2017).  Karle Plumber, MD, FACP

## 2017-04-03 NOTE — Patient Instructions (Addendum)
Please call Jonna Clark [phone], with the B[facility]CP (breast and cervical cancer control program) to schedule diagnostic mammogram    Please mail in your stool card.  When you meet with financial specialist, please inquire whether [facility] or [facility] discount cover for eye exam.   Influenza Virus Vaccine injection (Fluarix) What is this medicine? INFLUENZA VIRUS VACCINE (in floo EN zuh VAHY ruhs vak SEEN) helps to reduce the risk of getting influenza also known as the flu. This medicine may be used for other purposes; ask your health care provider or pharmacist if you have questions. COMMON BRAND NAME(S): Fluarix, Fluzone What should I tell my health care provider before I take this medicine? They need to know if you have any of these conditions: -bleeding disorder like hemophilia -fever or infection -Guillain-Barre syndrome or other neurological problems -immune system problems -infection with the human immunodeficiency virus (HIV) or AIDS -low blood platelet counts -multiple sclerosis -an unusual or allergic reaction to influenza virus vaccine, eggs, chicken proteins, latex, gentamicin, other medicines, foods, dyes or preservatives -pregnant or trying to get pregnant -breast-feeding How should I use this medicine? This vaccine is for injection into a muscle. It is given by a health care professional. A copy of Vaccine Information Statements will be given before each vaccination. Read this sheet carefully each time. The sheet may change frequently. Talk to your pediatrician regarding the use of this medicine in children. Special care may be needed. Overdosage: If you think you have taken too much of this medicine contact a poison control center or emergency room at once. NOTE: This medicine is only for you. Do not share this medicine with others. What if I miss a dose? This does not apply. What may interact with this medicine? -chemotherapy or radiation therapy -medicines that  lower your immune system like etanercept, anakinra, infliximab, and adalimumab -medicines that treat or prevent blood clots like warfarin -phenytoin -steroid medicines like prednisone or cortisone -theophylline -vaccines This list may not describe all possible interactions. Give your health care provider a list of all the medicines, herbs, non-prescription drugs, or dietary supplements you use. Also tell them if you smoke, drink alcohol, or use illegal drugs. Some items may interact with your medicine. What should I watch for while using this medicine? Report any side effects that do not go away within 3 days to your doctor or health care professional. Call your health care provider if any unusual symptoms occur within 6 weeks of receiving this vaccine. You may still catch the flu, but the illness is not usually as bad. You cannot get the flu from the vaccine. The vaccine will not protect against colds or other illnesses that may cause fever. The vaccine is needed every year. What side effects may I notice from receiving this medicine? Side effects that you should report to your doctor or health care professional as soon as possible: -allergic reactions like skin rash, itching or hives, swelling of the face, lips, or tongue Side effects that usually do not require medical attention (report to your doctor or health care professional if they continue or are bothersome): -fever -headache -muscle aches and pains -pain, tenderness, redness, or swelling at site where injected -weak or tired This list may not describe all possible side effects. Call your doctor for medical advice about side effects. You may report side effects to FDA at 1-800-FDA-1088. Where should I keep my medicine? This vaccine is only given in a clinic, pharmacy, doctor's office, or other health  care setting and will not be stored at home. NOTE: This sheet is a summary. It may not cover all possible information. If you have  questions about this medicine, talk to your doctor, pharmacist, or health care provider.  2018 Elsevier/Gold Standard (2007-12-15 09:30:40)  Pneumococcal Conjugate Vaccine (PCV13) What You Need to Know 1. Why get vaccinated? Vaccination can protect both children and adults from pneumococcal disease. Pneumococcal disease is caused by bacteria that can spread from person to person through close contact. It can cause ear infections, and it can also lead to more serious infections of the:  Lungs (pneumonia),  Blood (bacteremia), and  Covering of the brain and spinal cord (meningitis).  Pneumococcal pneumonia is most common among adults. Pneumococcal meningitis can cause deafness and brain damage, and it kills about 1 child in 10 who get it. Anyone can get pneumococcal disease, but children under 2 years of age and adults 57 years and older, people with certain medical conditions, and cigarette smokers are at the highest risk. Before there was a vaccine, the [place] saw:  more than 700 cases of meningitis,  about 13,000 blood infections,  about 5 million ear infections, and  about 200 deaths  in children under 5 each year from pneumococcal disease. Since vaccine became available, severe pneumococcal disease in these children has fallen by 88%. About 18,000 older adults die of pneumococcal disease each year in the [place]. Treatment of pneumococcal infections with penicillin and other drugs is not as effective as it used to be, because some strains of the disease have become resistant to these drugs. This makes prevention of the disease, through vaccination, even more important. 2. PCV13 vaccine Pneumococcal conjugate vaccine (called PCV13) protects against 13 types of pneumococcal bacteria. PCV13 is routinely given to children at 2, 4, 6, and 67-75 months of age. It is also recommended for children and adults 27 to 43 years of age with certain health conditions, and for all  adults 50 years of age and older. Your doctor can give you details. 3. Some people should not get this vaccine Anyone who has ever had a life-threatening allergic reaction to a dose of this vaccine, to an earlier pneumococcal vaccine called PCV7, or to any vaccine containing diphtheria toxoid (for example, DTaP), should not get PCV13. Anyone with a severe allergy to any component of PCV13 should not get the vaccine. Tell your doctor if the person being vaccinated has any severe allergies. If the person scheduled for vaccination is not feeling well, your healthcare provider might decide to reschedule the shot on another day. 4. Risks of a vaccine reaction With any medicine, including vaccines, there is a chance of reactions. These are usually mild and go away on their own, but serious reactions are also possible. Problems reported following PCV13 varied by age and dose in the series. The most common problems reported among children were:  About half became drowsy after the shot, had a temporary loss of appetite, or had redness or tenderness where the shot was given.  About 1 out of 3 had swelling where the shot was given.  About 1 out of 3 had a mild fever, and about 1 in 20 had a fever over 102.18F.  Up to about 8 out of 10 became fussy or irritable.  Adults have reported pain, redness, and swelling where the shot was given; also mild fever, fatigue, headache, chills, or muscle pain. Young children who get PCV13 along with inactivated flu vaccine at the  same time may be at increased risk for seizures caused by fever. Ask your doctor for more information. Problems that could happen after any vaccine:  People sometimes faint after a medical procedure, including vaccination. Sitting or lying down for about 15 minutes can help prevent fainting, and injuries caused by a fall. Tell your doctor if you feel dizzy, or have vision changes or ringing in the ears.  Some older children and adults get  severe pain in the shoulder and have difficulty moving the arm where a shot was given. This happens very rarely.  Any medication can cause a severe allergic reaction. Such reactions from a vaccine are very rare, estimated at about 1 in a million doses, and would happen within a few minutes to a few hours after the vaccination. As with any medicine, there is a very small chance of a vaccine causing a serious injury or death. The safety of vaccines is always being monitored. For more information, visit: http://www.aguilar.org/ 5. What if there is a serious reaction? What should I look for? Look for anything that concerns you, such as signs of a severe allergic reaction, very high fever, or unusual behavior. Signs of a severe allergic reaction can include hives, swelling of the face and throat, difficulty breathing, a fast heartbeat, dizziness, and weakness-usually within a few minutes to a few hours after the vaccination. What should I do?  If you think it is a severe allergic reaction or other emergency that can't wait, call 9-1-1 or get the person to the nearest hospital. Otherwise, call your doctor.  Reactions should be reported to the Vaccine Adverse Event Reporting System (VAERS). Your doctor should file this report, or you can do it yourself through the VAERS web site at www.vaers.SamedayNews.es, or by calling (819)632-3773. ? VAERS does not give medical advice. 6. The National Vaccine Injury Compensation Program The [employer] Vaccine Injury Compensation Program (VICP) is a federal program that was created to compensate people who may have been injured by certain vaccines. Persons who believe they may have been injured by a vaccine can learn about the program and about filing a claim by calling 956-572-3675 or visiting the [place] website at GoldCloset.com.ee. There is a time limit to file a claim for compensation. 7. How can I learn more?  Ask your healthcare provider. He or she  can give you the vaccine package insert or suggest other sources of information.  Call your local or state health department.  Contact the Centers for Disease Control and Prevention (CDC): ? Call (772)029-1596 (1-800-CDC-INFO) or ? Visit CDC's website at http://hunter.com/ Vaccine Information Statement, PCV13 Vaccine (04/06/2014) This information is not intended to replace advice given to you by your health care provider. Make sure you discuss any questions you have with your health care provider. Document Released: 03/16/2006 Document Revised: 02/07/2016 Document Reviewed: 02/07/2016 Elsevier Interactive Patient Education  2017 [employer].

## 2017-04-20 ENCOUNTER — Other Ambulatory Visit: Payer: Self-pay | Admitting: Obstetrics and Gynecology

## 2017-04-20 DIAGNOSIS — Z1231 Encounter for screening mammogram for malignant neoplasm of breast: Secondary | ICD-10-CM

## 2017-05-15 ENCOUNTER — Telehealth: Payer: Self-pay | Admitting: Internal Medicine

## 2017-05-15 DIAGNOSIS — Z1211 Encounter for screening for malignant neoplasm of colon: Secondary | ICD-10-CM

## 2017-05-15 NOTE — Telephone Encounter (Signed)
Pt came to the office to request a referral for a colonoscopy, pt has [employer] insurance, please follow up

## 2017-05-28 ENCOUNTER — Encounter (HOSPITAL_COMMUNITY): Payer: Self-pay

## 2017-05-28 ENCOUNTER — Ambulatory Visit (HOSPITAL_COMMUNITY)
Admission: RE | Admit: 2017-05-28 | Discharge: 2017-05-28 | Disposition: A | Discharge status: Home / Self Care | Payer: Self-pay | Source: Ambulatory Visit | Attending: Obstetrics and Gynecology | Admitting: Obstetrics and Gynecology

## 2017-05-28 ENCOUNTER — Ambulatory Visit
Admission: RE | Admit: 2017-05-28 | Discharge: 2017-05-28 | Disposition: A | Discharge status: Home / Self Care | Payer: Self-pay | Source: Ambulatory Visit | Attending: Obstetrics and Gynecology | Admitting: Obstetrics and Gynecology

## 2017-05-28 VITALS — BP 150/90 | Temp 98.4°F | Ht 62.0 in | Wt 159.6 lb

## 2017-05-28 DIAGNOSIS — Z1231 Encounter for screening mammogram for malignant neoplasm of breast: Secondary | ICD-10-CM

## 2017-05-28 DIAGNOSIS — Z1239 Encounter for other screening for malignant neoplasm of breast: Secondary | ICD-10-CM

## 2017-05-28 NOTE — Patient Instructions (Signed)
Explained breast self awareness with Aretta Nip. Patient did not need a Pap smear today due to last Pap smear and HPV typing was 11/29/2014. Let her know B[facility]CP will cover Pap smears and HPV typing every 5 years unless has a history of abnormal Pap smears.  Referred patient to the [facility] for a screening mammogram. Appointment scheduled for Thursday, May 28, 2017 at [place]. Let patient know the [facility] will follow up with her within the next couple weeks with results of mammogram by letter or phone. Aiyonna Lucado verbalized understanding.  Devlin Brink, Arvil [place], RN 9:01 AM

## 2017-05-28 NOTE — Progress Notes (Addendum)
No complaints today.   Pap Smear: Pap smear not completed today. Last Pap smear was 11/29/2014 at [facility] and normal with negative HPV. Last Pap smear result is in Epic.  Physical exam: Breasts Breasts symmetrical. No skin abnormalities bilateral breasts. No nipple retraction bilateral breasts. No nipple discharge bilateral breasts. No lymphadenopathy. No lumps palpated bilateral breasts. No complaints of pain or tenderness on exam. Referred patient to the [facility] for a screening mammogram. Appointment scheduled for Thursday, May 28, 2017 at [place].        Pelvic/Bimanual No Pap smear completed today since last Pap smear and HPV typing was 11/29/2014. Pap smear not indicated per BCCCP guidelines.   Smoking History: Patient has never smoked.  Patient Navigation: Patient education provided. Access to services provided for patient through [place] program.   Colorectal Cancer Screening: Per patient has never had a colonoscopy completed. No complaints today. FIT Test given to patient to complete and return to B[facility]CP.

## 2017-05-29 ENCOUNTER — Encounter (HOSPITAL_COMMUNITY): Payer: Self-pay | Admitting: *Deleted

## 2017-05-29 ENCOUNTER — Other Ambulatory Visit: Payer: Self-pay | Admitting: Obstetrics and Gynecology

## 2017-05-29 DIAGNOSIS — R928 Other abnormal and inconclusive findings on diagnostic imaging of breast: Secondary | ICD-10-CM

## 2017-06-05 LAB — FECAL OCCULT BLOOD, IMMUNOCHEMICAL: FECAL OCCULT BLD: NEGATIVE

## 2017-06-11 ENCOUNTER — Ambulatory Visit
Admission: RE | Admit: 2017-06-11 | Discharge: 2017-06-11 | Disposition: A | Discharge status: Home / Self Care | Payer: No Typology Code available for payment source | Source: Ambulatory Visit | Attending: Obstetrics and Gynecology | Admitting: Obstetrics and Gynecology

## 2017-06-11 ENCOUNTER — Other Ambulatory Visit: Payer: Self-pay | Admitting: Obstetrics and Gynecology

## 2017-06-11 DIAGNOSIS — R921 Mammographic calcification found on diagnostic imaging of breast: Secondary | ICD-10-CM

## 2017-06-11 DIAGNOSIS — R928 Other abnormal and inconclusive findings on diagnostic imaging of breast: Secondary | ICD-10-CM

## 2017-06-19 ENCOUNTER — Encounter: Payer: Self-pay | Admitting: Internal Medicine

## 2017-07-16 ENCOUNTER — Encounter: Payer: Self-pay | Admitting: Internal Medicine

## 2017-07-16 ENCOUNTER — Ambulatory Visit: Payer: Self-pay | Attending: Internal Medicine | Admitting: Internal Medicine

## 2017-07-16 VITALS — BP 136/70 | HR 74 | Temp 97.8°F | Wt 155.4 lb

## 2017-07-16 DIAGNOSIS — E119 Type 2 diabetes mellitus without complications: Secondary | ICD-10-CM | POA: Insufficient documentation

## 2017-07-16 DIAGNOSIS — L905 Scar conditions and fibrosis of skin: Secondary | ICD-10-CM | POA: Insufficient documentation

## 2017-07-16 DIAGNOSIS — H269 Unspecified cataract: Secondary | ICD-10-CM | POA: Insufficient documentation

## 2017-07-16 DIAGNOSIS — Z79899 Other long term (current) drug therapy: Secondary | ICD-10-CM | POA: Insufficient documentation

## 2017-07-16 DIAGNOSIS — E785 Hyperlipidemia, unspecified: Secondary | ICD-10-CM | POA: Insufficient documentation

## 2017-07-16 DIAGNOSIS — R921 Mammographic calcification found on diagnostic imaging of breast: Secondary | ICD-10-CM | POA: Insufficient documentation

## 2017-07-16 DIAGNOSIS — Z7984 Long term (current) use of oral hypoglycemic drugs: Secondary | ICD-10-CM | POA: Insufficient documentation

## 2017-07-16 DIAGNOSIS — Z833 Family history of diabetes mellitus: Secondary | ICD-10-CM | POA: Insufficient documentation

## 2017-07-16 DIAGNOSIS — Z9889 Other specified postprocedural states: Secondary | ICD-10-CM | POA: Insufficient documentation

## 2017-07-16 DIAGNOSIS — Z8042 Family history of malignant neoplasm of prostate: Secondary | ICD-10-CM | POA: Insufficient documentation

## 2017-07-16 DIAGNOSIS — Z8249 Family history of ischemic heart disease and other diseases of the circulatory system: Secondary | ICD-10-CM | POA: Insufficient documentation

## 2017-07-16 DIAGNOSIS — M1990 Unspecified osteoarthritis, unspecified site: Secondary | ICD-10-CM | POA: Insufficient documentation

## 2017-07-16 DIAGNOSIS — I10 Essential (primary) hypertension: Secondary | ICD-10-CM | POA: Insufficient documentation

## 2017-07-16 DIAGNOSIS — Z7982 Long term (current) use of aspirin: Secondary | ICD-10-CM | POA: Insufficient documentation

## 2017-07-16 LAB — GLUCOSE, POCT (MANUAL RESULT ENTRY): POC GLUCOSE: 145 mg/dL — AB (ref 70–99)

## 2017-07-16 NOTE — Progress Notes (Signed)
Patient ID: [name], female    DOB: 1950/10/18  MRN: [id number]  CC: Diabetes   Subjective: [name] is a 67 y.o. female who presents for chronic ds management.  Las seen 04/2017 Her concerns today include:  Patient with history of DM, HTN, HL, OA.   1.  DM:   -checking BS Q a.m.  Reports average 110.  Highest was this a.m of 142.  No hypoglycemia -med: compliant with Amaryl. Eating habits: doing well -Eye: intermittent blurred vision.  Has cataracts.  Not able to get eye exam since last visit.  Has OC   2.  HTN:  Checks BP a few times a mth. Reports nl readings No CP/SOB/LE edema Still exercising about 3 x a wk on elliptical  3.  Abnormal MMG: has calcifications in LT breast. U/S done. Likely benign Ca+.  Will need repeat in 6 mths Pt does not feel any abnormal mass in breast  4.  Chronic discoloration of ears.  Attributes this to use of skin lighting cream that she used for several yrs when she lived in [place].  Would like to see derm  Patient Active Problem List   Diagnosis Date Noted  . Immunization due 04/03/2017  . Hyperlipidemia associated with type 2 diabetes mellitus ([place]) 05/18/2016  . Primary osteoarthritis involving multiple joints 05/16/2016  . Diabetes type 2, controlled ([place]) 03/13/2015  . 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 Bilateral cataracts 03/13/2015  . Hypertension 10/06/2014     Current Outpatient Medications on File Prior to Visit  Medication Sig Dispense Refill  . aspirin EC 81 MG tablet Take 1 tablet (81 mg total) by mouth daily. 30 tablet 11  . atorvastatin (LIPITOR) 40 MG tablet Take 1 tablet (40 mg total) by mouth daily. 30 tablet 11  . Blood Glucose Monitoring Suppl (TRUE METRIX METER) W/DEVICE KIT 1 each by Does not apply route as needed. 1 kit 0  . glimepiride (AMARYL) 2 MG tablet TAKE 1 TABLET BY MOUTH DAILY BEFORE BREAKFAST. 30 tablet 11  . Glucosamine-Chondroitin 750-600 MG TABS Take 2 tablets by mouth daily.  0  .  glucose blood (TRUE METRIX BLOOD GLUCOSE TEST) test strip 1 each by Other route daily. 100 each 3  . hydrochlorothiazide (MICROZIDE) 12.5 MG capsule Take 1 capsule (12.5 mg total) by mouth daily. 30 capsule 11  . ibuprofen (ADVIL,MOTRIN) 400 MG tablet Take 400 mg by mouth every 6 (six) hours as needed.    Marland Kitchen NIFEdipine (PROCARDIA-XL/ADALAT-CC/NIFEDICAL-XL) 30 MG 24 hr tablet Take 1 tablet (30 mg total) by mouth daily. 30 tablet 11  . TRUEPLUS LANCETS 28G MISC 1 each by Does not apply route daily. 100 each 3  . Turmeric 500 MG CAPS Take 500 mg by mouth daily.     No current facility-administered medications on file prior to visit.     No Known Allergies  Social History   Socioeconomic History  . Marital status: Married    Spouse name: Not on file  . Number of children: 3  . Years of education: 16   . Highest education level: Not on file  Social Needs  . Financial resource strain: Not on file  . Food insecurity - worry: Not on file  . Food insecurity - inability: Not on file  . Transportation needs - medical: Not on file  . Transportation needs - non-medical: Not on file  Occupational History  . Not on file  Tobacco Use  . Smoking status: Never Smoker  .  Smokeless tobacco: Never Used  Substance and Sexual Activity  . Alcohol use: Yes    Comment: occassional  . Drug use: No  . Sexual activity: No    Birth control/protection: None  Other Topics Concern  . Not on file  Social History Narrative   From [place]   Moved to [place] on 09/11/2014      Has 3 children- 1 daughter in [place],    1 child in [place]    Family History  Problem Relation Age of Onset  . Hypertension Mother   . Diabetes Mother   . Cancer Father        prostate  . Diabetes Sister     Past Surgical History:  Procedure Laterality Date  . CESAREAN SECTION  1990    ROS: Review of Systems Negative except as stated above. PHYSICAL EXAM: BP 136/70   Pulse 74   Temp 97.8 F (36.6 C) (Oral)   Wt 155 lb 6.4 oz  (70.5 kg)   SpO2 99%   BMI 28.42 kg/m   Wt Readings from Last 3 Encounters:  07/16/17 155 lb 6.4 oz (70.5 kg)  05/28/17 159 lb 9.6 oz (72.4 kg)  04/03/17 155 lb (70.3 kg)   Physical Exam General appearance - alert, well appearing, and in no distress Mental status - alert, oriented to person, place, and time, normal mood, behavior, speech, dress, motor activity, and thought processes Eyes - pink conjunctiva Mouth - mucous membranes moist, pharynx normal without lesions Neck - supple, no significant adenopathy Chest - clear to auscultation, no wheezes, rales or rhonchi, symmetric air entry Heart - normal rate, regular rhythm, normal S1, S2, no murmurs, rubs, clicks or gallops Extremities - peripheral pulses normal, no pedal edema, no clubbing or cyanosis Skin - hyperpigmentation and scarring of skin on both ears and dry skin around neck.   Lab Results  Component Value Date   CHOL 116 12/29/2016   HDL 65 12/29/2016   LDLCALC 44 12/29/2016   TRIG 35 12/29/2016   CHOLHDL 1.8 12/29/2016   Lab Results  Component Value Date   WBC 5.2 05/16/2016   HGB 14.0 05/16/2016   HCT 40.9 05/16/2016   MCV 86.7 05/16/2016   PLT 331 05/16/2016   Results for orders placed or performed in visit on 07/16/17  POCT glucose (manual entry)  Result Value Ref Range   POC Glucose 145 (A) 70 - 99 mg/dl    ASSESSMENT AND PLAN: 1. Controlled type 2 diabetes mellitus without complication, without long-term current use of insulin (HCC) Continue Amaryl, healthy eating and regular exercise - POCT glucose (manual entry) - Microalbumin / creatinine urine ratio - Ambulatory referral to Ophthalmology - Hemoglobin A1c  2. Essential hypertension At goal.  Continue Procardia  3. Scarring of skin - Ambulatory referral to Dermatology  4. Breast calcification, left -will need f/u imaging in 6 mths   Patient was given the opportunity to ask questions.  Patient verbalized understanding of the plan and was  able to repeat key elements of the plan.   Orders Placed This Encounter  Procedures  . Microalbumin / creatinine urine ratio  . Hemoglobin A1c  . Ambulatory referral to Ophthalmology  . Ambulatory referral to Dermatology  . POCT glucose (manual entry)     Requested Prescriptions    No prescriptions requested or ordered in this encounter    Return in about 4 months (around 11/13/2017).  Karle Plumber, MD, FACP

## 2017-07-16 NOTE — Patient Instructions (Signed)
Your blood sugar and blood pressure are controlled.  Keep up the good works with healthy eating habits and regular exercise.

## 2017-07-17 LAB — HEMOGLOBIN A1C
ESTIMATED AVERAGE GLUCOSE: 123 mg/dL
HEMOGLOBIN A1C: 5.9 % — AB (ref 4.8–5.6)

## 2017-07-17 LAB — MICROALBUMIN / CREATININE URINE RATIO
Creatinine, Urine: 56.7 mg/dL
Microalb/Creat Ratio: <5.3 mg/g creat (ref 0.0–30.0)
Microalbumin, Urine: <3 ug/mL

## 2017-07-20 ENCOUNTER — Telehealth: Payer: Self-pay

## 2017-07-20 NOTE — Telephone Encounter (Signed)
Contacted pt to go over lab results pt didn't answer left a detailed vm informing pt of results and if she has any questions or concerns to give me a call  If pt calls back please give results: A1c was 5.9. This is a three-month test for the diabetes. Goal is to be less than 7. Her diabetes is under good control.

## 2017-09-29 ENCOUNTER — Encounter: Payer: Self-pay | Admitting: Internal Medicine

## 2017-10-29 ENCOUNTER — Ambulatory Visit: Payer: No Typology Code available for payment source

## 2017-11-26 ENCOUNTER — Ambulatory Visit: Payer: Self-pay | Attending: Internal Medicine | Admitting: Physician Assistant

## 2017-11-26 VITALS — BP 149/81 | HR 65 | Temp 98.6°F | Resp 18 | Ht 60.0 in | Wt 158.0 lb

## 2017-11-26 DIAGNOSIS — Z7982 Long term (current) use of aspirin: Secondary | ICD-10-CM | POA: Insufficient documentation

## 2017-11-26 DIAGNOSIS — Z79899 Other long term (current) drug therapy: Secondary | ICD-10-CM | POA: Insufficient documentation

## 2017-11-26 DIAGNOSIS — Z7984 Long term (current) use of oral hypoglycemic drugs: Secondary | ICD-10-CM | POA: Insufficient documentation

## 2017-11-26 DIAGNOSIS — E119 Type 2 diabetes mellitus without complications: Secondary | ICD-10-CM | POA: Insufficient documentation

## 2017-11-26 DIAGNOSIS — I10 Essential (primary) hypertension: Secondary | ICD-10-CM | POA: Insufficient documentation

## 2017-11-26 DIAGNOSIS — E1169 Type 2 diabetes mellitus with other specified complication: Secondary | ICD-10-CM

## 2017-11-26 DIAGNOSIS — E785 Hyperlipidemia, unspecified: Secondary | ICD-10-CM | POA: Insufficient documentation

## 2017-11-26 LAB — GLUCOSE, POCT (MANUAL RESULT ENTRY): POC Glucose: 104 mg/dl — AB (ref 70–99)

## 2017-11-26 LAB — POCT GLYCOSYLATED HEMOGLOBIN (HGB A1C): HbA1c, POC (prediabetic range): 5.9 % (ref 5.7–6.4)

## 2017-11-26 MED ORDER — HYDROCHLOROTHIAZIDE 12.5 MG PO CAPS: 12.5000 mg | ORAL_CAPSULE | Freq: Every day | ORAL | 11 refills | Status: DC

## 2017-11-26 MED ORDER — GLIMEPIRIDE 2 MG PO TABS: ORAL_TABLET | ORAL | 11 refills | Status: DC

## 2017-11-26 MED ORDER — ATORVASTATIN CALCIUM 40 MG PO TABS: 40.0000 mg | ORAL_TABLET | Freq: Every day | ORAL | 11 refills | Status: DC

## 2017-11-26 MED ORDER — NIFEDIPINE ER OSMOTIC RELEASE 30 MG PO TB24: 30.0000 mg | ORAL_TABLET | Freq: Every day | ORAL | 11 refills | Status: DC

## 2017-11-26 NOTE — Progress Notes (Signed)
Patient ID: [name], female   DOB: 1950/06/26, 67 y.o.   MRN: [id number]     [name], is a 67 y.o. female  WYO:[id number]  DXA:[id number]  DOB - Aug 26, 1950  Subjective:  Chief Complaint and HPI: [name] is a 67 y.o. female here today Needs RF.  Blood sugars running 120 fasting.  Doing well; no problems, no complaints.  About to go to [place] for 1 week.   ROS:   Constitutional:  No f/c, No night sweats, No unexplained weight loss. EENT:  No vision changes, No blurry vision, No hearing changes. No mouth, throat, or ear problems.  Respiratory: No cough, No SOB Cardiac: No CP, no palpitations GI:  No abd pain, No N/V/D. GU: No Urinary s/sx Musculoskeletal: No joint pain Neuro: No headache, no dizziness, no motor weakness.  Skin: No rash Endocrine:  No polydipsia. No polyuria.  Psych: Denies SI/HI  No problems updated.  ALLERGIES: No Known Allergies  PAST MEDICAL HISTORY: Past Medical History:  Diagnosis Date  . Diabetes mellitus without complication ([place])   . Hypertension 2006  . Malaria 09/20/2014     MEDICATIONS AT HOME: Prior to Admission medications   Medication Sig Start Date End Date Taking? Authorizing Provider  aspirin EC 81 MG tablet Take 1 tablet (81 mg total) by mouth daily. 12/29/16   Funches, Adriana Mccallum, MD  atorvastatin (LIPITOR) 40 MG tablet Take 1 tablet (40 mg total) by mouth daily. 11/26/17   Argentina Donovan, PA-C  Blood Glucose Monitoring Suppl (TRUE METRIX METER) W/DEVICE KIT 1 each by Does not apply route as needed. 03/13/15   Funches, Adriana Mccallum, MD  glimepiride (AMARYL) 2 MG tablet TAKE 1 TABLET BY MOUTH DAILY BEFORE BREAKFAST. 11/26/17   Argentina Donovan, PA-C  Glucosamine-Chondroitin 750-600 MG TABS Take 2 tablets by mouth daily. 05/16/16   Funches, Adriana Mccallum, MD  glucose blood (TRUE METRIX BLOOD GLUCOSE TEST) test strip 1 each by Other route daily. 12/29/16   Funches, Adriana Mccallum, MD  hydrochlorothiazide (MICROZIDE) 12.5 MG capsule Take 1  capsule (12.5 mg total) by mouth daily. 11/26/17   Argentina Donovan, PA-C  ibuprofen (ADVIL,MOTRIN) 400 MG tablet Take 400 mg by mouth every 6 (six) hours as needed.    [provider]  NIFEdipine (PROCARDIA-XL/ADALAT-CC/NIFEDICAL-XL) 30 MG 24 hr tablet Take 1 tablet (30 mg total) by mouth daily. 11/26/17   Argentina Donovan, PA-C  TRUEPLUS LANCETS 28G MISC 1 each by Does not apply route daily. 12/29/16   Funches, Adriana Mccallum, MD  Turmeric 500 MG CAPS Take 500 mg by mouth daily. 05/16/16   Funches, Adriana Mccallum, MD     Objective:  EXAM:   Vitals:   11/26/17 0855  BP: (!) 149/81  Pulse: 65  Resp: 18  Temp: 98.6 F (37 C)  TempSrc: Oral  SpO2: 100%  Weight: 158 lb (71.7 kg)  Height: 5' (1.524 m)    General appearance : A&OX3. NAD. Non-toxic-appearing HEENT: Atraumatic and Normocephalic.  PERRLA. EOM intact.   Neck: supple, no JVD. No cervical lymphadenopathy. No thyromegaly Chest/Lungs:  Breathing-non-labored, Good air entry bilaterally, breath sounds normal without rales, rhonchi, or wheezing  CVS: S1 S2 regular, no murmurs, gallops, rubs  Extremities: Bilateral Lower Ext shows no edema, both legs are warm to touch with = pulse throughout Neurology:  CN II-XII grossly intact, Non focal.   Psych:  TP linear. J/I WNL. Normal speech. Appropriate eye contact and affect.  Skin:  No Rash  Data Review Lab Results  Component Value Date  HGBA1C 5.9 (H) 07/16/2017   HGBA1C 5.9 (H) 12/29/2016   HGBA1C 6.5 05/16/2016     Assessment & Plan   1. Controlled type 2 diabetes mellitus without complication, without long-term current use of insulin (HCC) Adequate control-continue current regimen - Glucose (CBG) - HgB A1c - Comprehensive metabolic panel - glimepiride (AMARYL) 2 MG tablet; TAKE 1 TABLET BY MOUTH DAILY BEFORE BREAKFAST.  Dispense: 30 tablet; Refill: 11  2. Essential hypertension Not at goal but was out of HCTZ.  Resume regimen - Comprehensive metabolic panel - CBC with  Differential/Platelet - NIFEdipine (PROCARDIA-XL/ADALAT-CC/NIFEDICAL-XL) 30 MG 24 hr tablet; Take 1 tablet (30 mg total) by mouth daily.  Dispense: 30 tablet; Refill: 11 - hydrochlorothiazide (MICROZIDE) 12.5 MG capsule; Take 1 capsule (12.5 mg total) by mouth daily.  Dispense: 30 capsule; Refill: 11  3. Hyperlipidemia associated with type 2 diabetes mellitus (HCC) Stable-continue regimen - Lipid Panel - atorvastatin (LIPITOR) 40 MG tablet; Take 1 tablet (40 mg total) by mouth daily.  Dispense: 30 tablet; Refill: 11     Patient have been counseled extensively about nutrition and exercise  Return in about 3 months (around 02/26/2018) for Dr Carmelina Dane and htn f/up.  The patient was given clear instructions to go to ER or return to medical center if symptoms don't improve, worsen or new problems develop. The patient verbalized understanding. The patient was told to call to get lab results if they haven't heard anything in the next week.     Freeman Caldron, PA-C [facility] and [facility], [place]   11/26/2017, 8:56 AM

## 2017-11-26 NOTE — Patient Instructions (Signed)
Diabetes Mellitus and Nutrition When you have diabetes (diabetes mellitus), it is very important to have healthy eating habits because your blood sugar (glucose) levels are greatly affected by what you eat and drink. Eating healthy foods in the appropriate amounts, at about the same times every day, can help you:  Control your blood glucose.  Lower your risk of heart disease.  Improve your blood pressure.  Reach or maintain a healthy weight.  Every person with diabetes is different, and each person has different needs for a meal plan. Your health care provider may recommend that you work with a diet and nutrition specialist (dietitian) to make a meal plan that is best for you. Your meal plan may vary depending on factors such as:  The calories you need.  The medicines you take.  Your weight.  Your blood glucose, blood pressure, and cholesterol levels.  Your activity level.  Other health conditions you have, such as heart or kidney disease.  How do carbohydrates affect me? Carbohydrates affect your blood glucose level more than any other type of food. Eating carbohydrates naturally increases the amount of glucose in your blood. Carbohydrate counting is a method for keeping track of how many carbohydrates you eat. Counting carbohydrates is important to keep your blood glucose at a healthy level, especially if you use insulin or take certain oral diabetes medicines. It is important to know how many carbohydrates you can safely have in each meal. This is different for every person. Your dietitian can help you calculate how many carbohydrates you should have at each meal and for snack. Foods that contain carbohydrates include:  Bread, cereal, rice, pasta, and crackers.  Potatoes and corn.  Peas, beans, and lentils.  Milk and yogurt.  Fruit and juice.  Desserts, such as cakes, cookies, ice cream, and candy.  How does alcohol affect me? Alcohol can cause a sudden decrease in blood  glucose (hypoglycemia), especially if you use insulin or take certain oral diabetes medicines. Hypoglycemia can be a life-threatening condition. Symptoms of hypoglycemia (sleepiness, dizziness, and confusion) are similar to symptoms of having too much alcohol. If your health care provider says that alcohol is safe for you, follow these guidelines:  Limit alcohol intake to no more than 1 drink per day for nonpregnant women and 2 drinks per day for men. One drink equals 12 oz of beer, 5 oz of wine, or 1 oz of hard liquor.  Do not drink on an empty stomach.  Keep yourself hydrated with water, diet soda, or unsweetened iced tea.  Keep in mind that regular soda, juice, and other mixers may contain a lot of sugar and must be counted as carbohydrates.  What are tips for following this plan? Reading food labels  Start by checking the serving size on the label. The amount of calories, carbohydrates, fats, and other nutrients listed on the label are based on one serving of the food. Many foods contain more than one serving per package.  Check the total grams (g) of carbohydrates in one serving. You can calculate the number of servings of carbohydrates in one serving by dividing the total carbohydrates by 15. For example, if a food has 30 g of total carbohydrates, it would be equal to 2 servings of carbohydrates.  Check the number of grams (g) of saturated and trans fats in one serving. Choose foods that have low or no amount of these fats.  Check the number of milligrams (mg) of sodium in one serving. Most people   should limit total sodium intake to less than 2,300 mg per day.  Always check the nutrition information of foods labeled as "low-fat" or "nonfat". These foods may be higher in added sugar or refined carbohydrates and should be avoided.  Talk to your dietitian to identify your daily goals for nutrients listed on the label. Shopping  Avoid buying canned, premade, or processed foods. These  foods tend to be high in fat, sodium, and added sugar.  Shop around the outside edge of the grocery store. This includes fresh fruits and vegetables, bulk grains, fresh meats, and fresh dairy. Cooking  Use low-heat cooking methods, such as baking, instead of high-heat cooking methods like deep frying.  Cook using healthy oils, such as olive, canola, or sunflower oil.  Avoid cooking with butter, cream, or high-fat meats. Meal planning  Eat meals and snacks regularly, preferably at the same times every day. Avoid going long periods of time without eating.  Eat foods high in fiber, such as fresh fruits, vegetables, beans, and whole grains. Talk to your dietitian about how many servings of carbohydrates you can eat at each meal.  Eat 4-6 ounces of lean protein each day, such as lean meat, chicken, fish, eggs, or tofu. 1 ounce is equal to 1 ounce of meat, chicken, or fish, 1 egg, or 1/4 cup of tofu.  Eat some foods each day that contain healthy fats, such as avocado, nuts, seeds, and fish. Lifestyle   Check your blood glucose regularly.  Exercise at least 30 minutes 5 or more days each week, or as told by your health care provider.  Take medicines as told by your health care provider.  Do not use any products that contain nicotine or tobacco, such as cigarettes and e-cigarettes. If you need help quitting, ask your health care provider.  Work with a counselor or diabetes educator to identify strategies to manage stress and any emotional and social challenges. What are some questions to ask my health care provider?  Do I need to meet with a diabetes educator?  Do I need to meet with a dietitian?  What number can I call if I have questions?  When are the best times to check my blood glucose? Where to find more information:  American Diabetes Association: diabetes.org/food-and-fitness/food  Academy of Nutrition and Dietetics:  www.eatright.org/resources/health/diseases-and-conditions/diabetes  National Institute of Diabetes and Digestive and Kidney Diseases (NIH): www.niddk.nih.gov/health-information/diabetes/overview/diet-eating-physical-activity Summary  A healthy meal plan will help you control your blood glucose and maintain a healthy lifestyle.  Working with a diet and nutrition specialist (dietitian) can help you make a meal plan that is best for you.  Keep in mind that carbohydrates and alcohol have immediate effects on your blood glucose levels. It is important to count carbohydrates and to use alcohol carefully. This information is not intended to replace advice given to you by your health care provider. Make sure you discuss any questions you have with your health care provider. Document Released: 02/13/2005 Document Revised: 06/23/2016 Document Reviewed: 06/23/2016 Elsevier Interactive Patient Education  2018 Elsevier Inc.  

## 2017-11-27 LAB — COMPREHENSIVE METABOLIC PANEL
ALK PHOS: 72 IU/L (ref 39–117)
ALT: 14 IU/L (ref 0–32)
AST: 23 IU/L (ref 0–40)
Albumin/Globulin Ratio: 1.4 (ref 1.2–2.2)
Albumin: 4.3 g/dL (ref 3.6–4.8)
BILIRUBIN TOTAL: 0.5 mg/dL (ref 0.0–1.2)
BUN/Creatinine Ratio: 13 (ref 12–28)
BUN: 10 mg/dL (ref 8–27)
CO2: 26 mmol/L (ref 20–29)
CREATININE: 0.78 mg/dL (ref 0.57–1.00)
Calcium: 10.1 mg/dL (ref 8.7–10.3)
Chloride: 102 mmol/L (ref 96–106)
GFR calc Af Amer: 92 mL/min/{1.73_m2} (ref >59)
GFR calc non Af Amer: 79 mL/min/{1.73_m2} (ref >59)
GLUCOSE: 95 mg/dL (ref 65–99)
Globulin, Total: 3.1 g/dL (ref 1.5–4.5)
Potassium: 4.2 mmol/L (ref 3.5–5.2)
Sodium: 139 mmol/L (ref 134–144)
TOTAL PROTEIN: 7.4 g/dL (ref 6.0–8.5)

## 2017-11-27 LAB — LIPID PANEL
CHOLESTEROL TOTAL: 199 mg/dL (ref 100–199)
Chol/HDL Ratio: 3 ratio (ref 0.0–4.4)
HDL: 67 mg/dL (ref >39)
LDL Calculated: 117 mg/dL — ABNORMAL HIGH (ref 0–99)
TRIGLYCERIDES: 74 mg/dL (ref 0–149)
VLDL Cholesterol Cal: 15 mg/dL (ref 5–40)

## 2017-11-27 LAB — CBC WITH DIFFERENTIAL/PLATELET
BASOS ABS: 0.1 10*3/uL (ref 0.0–0.2)
Basos: 1 %
EOS (ABSOLUTE): 0.1 10*3/uL (ref 0.0–0.4)
EOS: 3 %
Hematocrit: 36.9 % (ref 34.0–46.6)
Hemoglobin: 12.9 g/dL (ref 11.1–15.9)
IMMATURE GRANULOCYTES: 0 %
Immature Grans (Abs): 0 10*3/uL (ref 0.0–0.1)
LYMPHS: 43 %
Lymphocytes Absolute: 2 10*3/uL (ref 0.7–3.1)
MCH: 29.9 pg (ref 26.6–33.0)
MCHC: 35 g/dL (ref 31.5–35.7)
MCV: 86 fL (ref 79–97)
MONOCYTES: 9 %
Monocytes Absolute: 0.4 10*3/uL (ref 0.1–0.9)
Neutrophils Absolute: 2 10*3/uL (ref 1.4–7.0)
Neutrophils: 44 %
PLATELETS: 283 10*3/uL (ref 150–450)
RBC: 4.31 x10E6/uL (ref 3.77–5.28)
RDW: 14.1 % (ref 12.3–15.4)
WBC: 4.6 10*3/uL (ref 3.4–10.8)

## 2017-11-30 ENCOUNTER — Telehealth: Payer: Self-pay | Admitting: Internal Medicine

## 2017-11-30 NOTE — Telephone Encounter (Signed)
Medical Assistant left message on patient's home and cell voicemail. Voicemail states to give a call back to Damyen Knoll with [facility] at [phone]. !!!Please inform patient of labs being normal and to follow up as planned!!! 

## 2017-11-30 NOTE — Telephone Encounter (Signed)
-----   Message from Argentina Donovan, [place] sent at 11/30/2017 11:30 AM EDT ----- Please call patient.  Labs are normal.  Follow-up as planned.  Thanks, Freeman Caldron, PA-C

## 2017-11-30 NOTE — Telephone Encounter (Signed)
Patient called asking for results 

## 2017-12-02 ENCOUNTER — Ambulatory Visit: Payer: No Typology Code available for payment source | Attending: Internal Medicine

## 2017-12-23 ENCOUNTER — Ambulatory Visit
Admission: RE | Admit: 2017-12-23 | Discharge: 2017-12-23 | Disposition: A | Discharge status: Home / Self Care | Payer: Self-pay | Source: Ambulatory Visit | Attending: Obstetrics and Gynecology | Admitting: Obstetrics and Gynecology

## 2017-12-23 DIAGNOSIS — R921 Mammographic calcification found on diagnostic imaging of breast: Secondary | ICD-10-CM

## 2018-04-01 ENCOUNTER — Encounter: Payer: Self-pay | Admitting: Internal Medicine

## 2018-04-01 ENCOUNTER — Ambulatory Visit: Payer: Self-pay | Attending: Internal Medicine | Admitting: Internal Medicine

## 2018-04-01 VITALS — BP 151/81 | HR 68 | Temp 98.1°F | Resp 16 | Wt 154.2 lb

## 2018-04-01 DIAGNOSIS — E785 Hyperlipidemia, unspecified: Secondary | ICD-10-CM | POA: Insufficient documentation

## 2018-04-01 DIAGNOSIS — Z8249 Family history of ischemic heart disease and other diseases of the circulatory system: Secondary | ICD-10-CM | POA: Insufficient documentation

## 2018-04-01 DIAGNOSIS — Z1211 Encounter for screening for malignant neoplasm of colon: Secondary | ICD-10-CM

## 2018-04-01 DIAGNOSIS — M1990 Unspecified osteoarthritis, unspecified site: Secondary | ICD-10-CM | POA: Insufficient documentation

## 2018-04-01 DIAGNOSIS — E1169 Type 2 diabetes mellitus with other specified complication: Secondary | ICD-10-CM

## 2018-04-01 DIAGNOSIS — Z79899 Other long term (current) drug therapy: Secondary | ICD-10-CM | POA: Insufficient documentation

## 2018-04-01 DIAGNOSIS — Z7982 Long term (current) use of aspirin: Secondary | ICD-10-CM | POA: Insufficient documentation

## 2018-04-01 DIAGNOSIS — I10 Essential (primary) hypertension: Secondary | ICD-10-CM | POA: Insufficient documentation

## 2018-04-01 DIAGNOSIS — Z23 Encounter for immunization: Secondary | ICD-10-CM

## 2018-04-01 DIAGNOSIS — H5210 Myopia, unspecified eye: Secondary | ICD-10-CM | POA: Insufficient documentation

## 2018-04-01 DIAGNOSIS — Z833 Family history of diabetes mellitus: Secondary | ICD-10-CM | POA: Insufficient documentation

## 2018-04-01 DIAGNOSIS — Z7984 Long term (current) use of oral hypoglycemic drugs: Secondary | ICD-10-CM | POA: Insufficient documentation

## 2018-04-01 DIAGNOSIS — E119 Type 2 diabetes mellitus without complications: Secondary | ICD-10-CM | POA: Insufficient documentation

## 2018-04-01 LAB — GLUCOSE, POCT (MANUAL RESULT ENTRY): POC Glucose: 98 mg/dl (ref 70–99)

## 2018-04-01 MED ORDER — HYDROCHLOROTHIAZIDE 25 MG PO TABS: 25.0000 mg | ORAL_TABLET | Freq: Every day | ORAL | 6 refills | Status: DC

## 2018-04-01 NOTE — Progress Notes (Signed)
Patient ID: [name], female    DOB: [birth year]  MRN: [id number]  CC: Diabetes and Hypertension   Subjective: [name] is a 67 y.o. female who presents for chronic ds management Her concerns today include:  Patient with history of DM, HTN, HL, OA.  Had repeat MMG in July for f/u LT breast Ca+. Things are stable.  DM:  Checks Bs Q a.m.  Gives range 80-120.  No low episodes. Does well with eating habits.  Avoids sweet foods. She stays active.  Works at a [employer] 3 x a wk.  Does a lot of walking Feels her vision is not as good as before.  Problems with distant vision No numbness in hands or feets  HTN:  Compliant with meds and salt restriction Has a device.  Checks BP once a wk.  Gives range 130-140/80 No CP/SOB/LE edema  HL:  Compliant with Lipitor Patient Active Problem List   Diagnosis Date Noted  . Scarring of skin 07/16/2017  . Breast calcification, left 07/16/2017  . Immunization due 04/03/2017  . Hyperlipidemia associated with type 2 diabetes mellitus ([place]) 05/18/2016  . Primary osteoarthritis involving multiple joints 05/16/2016  . Diabetes type 2, controlled ([place]) 03/13/2015  . 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 Bilateral cataracts 03/13/2015  . Hypertension 10/06/2014     Current Outpatient Medications on File Prior to Visit  Medication Sig Dispense Refill  . aspirin EC 81 MG tablet Take 1 tablet (81 mg total) by mouth daily. 30 tablet 11  . atorvastatin (LIPITOR) 40 MG tablet Take 1 tablet (40 mg total) by mouth daily. 30 tablet 11  . Blood Glucose Monitoring Suppl (TRUE METRIX METER) W/DEVICE KIT 1 each by Does not apply route as needed. 1 kit 0  . glimepiride (AMARYL) 2 MG tablet TAKE 1 TABLET BY MOUTH DAILY BEFORE BREAKFAST. 30 tablet 11  . Glucosamine-Chondroitin 750-600 MG TABS Take 2 tablets by mouth daily.  0  . glucose blood (TRUE METRIX BLOOD GLUCOSE TEST) test strip 1 each by Other route daily. 100 each 3  . ibuprofen (ADVIL,MOTRIN) 400 MG  tablet Take 400 mg by mouth every 6 (six) hours as needed.    Marland Kitchen NIFEdipine (PROCARDIA-XL/ADALAT-CC/NIFEDICAL-XL) 30 MG 24 hr tablet Take 1 tablet (30 mg total) by mouth daily. 30 tablet 11  . TRUEPLUS LANCETS 28G MISC 1 each by Does not apply route daily. 100 each 3  . Turmeric 500 MG CAPS Take 500 mg by mouth daily.     No current facility-administered medications on file prior to visit.     No Known Allergies  Social History   Socioeconomic History  . Marital status: Married    Spouse name: Not on file  . Number of children: 3  . Years of education: 6   . Highest education level: Not on file  Occupational History  . Not on file  Social Needs  . Financial resource strain: Not on file  . Food insecurity:    Worry: Not on file    Inability: Not on file  . Transportation needs:    Medical: Not on file    Non-medical: Not on file  Tobacco Use  . Smoking status: Never Smoker  . Smokeless tobacco: Never Used  Substance and Sexual Activity  . Alcohol use: Yes    Comment: occassional  . Drug use: No  . Sexual activity: Never    Birth control/protection: None  Lifestyle  . Physical activity:    Days per week: 7  days    Minutes per session: 30 min  . Stress: Only a little  Relationships  . Social connections:    Talks on phone: More than three times a week    Gets together: More than three times a week    Attends religious service: 1 to 4 times per year    Active member of club or organization: No    Attends meetings of clubs or organizations: Never    Relationship status: Married  . Intimate partner violence:    Fear of current or ex partner: No    Emotionally abused: No    Physically abused: No    Forced sexual activity: No  Other Topics Concern  . Not on file  Social History Narrative   From [place]   Moved to [place] on 09/11/2014      Has 3 children- 1 daughter in [place],    1 child in [place]    Family History  Problem Relation Age of Onset  . Hypertension Mother     . Diabetes Mother   . Cancer Father        prostate  . Diabetes Sister     Past Surgical History:  Procedure Laterality Date  . CESAREAN SECTION  1990    ROS: Review of Systems Neg except as above PHYSICAL EXAM: BP (!) 151/81   Pulse 68   Temp 98.1 F (36.7 C) (Oral)   Resp 16   Wt 154 lb 3.2 oz (69.9 kg)   SpO2 98%   BMI 30.12 kg/m   Physical Exam Repeat BP 140/80 General appearance - alert, well appearing, and in no distress Mental status - normal mood, behavior, speech, dress, motor activity, and thought processes Mouth - mucous membranes moist, pharynx normal without lesions Neck - supple, no significant adenopathy Chest - clear to auscultation, no wheezes, rales or rhonchi, symmetric air entry Heart - normal rate, regular rhythm, normal S1, S2, no murmurs, rubs, clicks or gallops Extremities - peripheral pulses normal, no pedal edema, no clubbing or cyanosis  Results for orders placed or performed in visit on 04/01/18  POCT glucose (manual entry)  Result Value Ref Range   POC Glucose 98 70 - 99 mg/dl    ASSESSMENT AND PLAN:  1. Controlled type 2 diabetes mellitus without complication, without long-term current use of insulin (HCC) Cont Amaryl, healthy eating habits and regular exercise - POCT glucose (manual entry) - Hemoglobin A1c  2. Essential hypertension Not at goal.  Increase HCTZ to 25 mg daily.  After being on this increased dose for 1 week she will return to the lab for BMP - hydrochlorothiazide (HYDRODIURIL) 25 MG tablet; Take 1 tablet (25 mg total) by mouth daily.  Dispense: 30 tablet; Refill: 6 - Basic Metabolic Panel; Future  3. Hyperlipidemia associated with type 2 diabetes mellitus (HCC) Continue Lipitor  4. Colon cancer screening - Ambulatory referral to Gastroenterology  5. Myopia, unspecified laterality Encouraged her to get an eye exam at least once a year given that she has diabetes.  She does not have insurance.  I gave her a list  of places in the area where cost is not too prohibitive  6. Need for influenza vaccination  Patient was given the opportunity to ask questions.  Patient verbalized understanding of the plan and was able to repeat key elements of the plan.   Orders Placed This Encounter  Procedures  . Hemoglobin A1c  . Basic Metabolic Panel  . Ambulatory referral to Gastroenterology  .  POCT glucose (manual entry)     Requested Prescriptions   Signed Prescriptions Disp Refills  . hydrochlorothiazide (HYDRODIURIL) 25 MG tablet 30 tablet 6    Sig: Take 1 tablet (25 mg total) by mouth daily.    Return in about 4 months (around 07/31/2018).  Karle Plumber, MD, FACP

## 2018-04-01 NOTE — Patient Instructions (Addendum)
Your blood pressure is not at goal of 130/80 or lower.  Please increase the hydrochlorothiazide to 25 mg daily.  Try to check your blood pressure at least once a week.  After you have been on the increased dose of the hydrochlorothiazide for 1 week, please return to the lab to have blood drawn to check kidney function.   Influenza Virus Vaccine injection (Fluarix) What is this medicine? INFLUENZA VIRUS VACCINE (in floo EN zuh VAHY ruhs vak SEEN) helps to reduce the risk of getting influenza also known as the flu. This medicine may be used for other purposes; ask your health care provider or pharmacist if you have questions. COMMON BRAND NAME(S): Fluarix, Fluzone What should I tell my health care provider before I take this medicine? They need to know if you have any of these conditions: -bleeding disorder like hemophilia -fever or infection -Guillain-Barre syndrome or other neurological problems -immune system problems -infection with the human immunodeficiency virus (HIV) or AIDS -low blood platelet counts -multiple sclerosis -an unusual or allergic reaction to influenza virus vaccine, eggs, chicken proteins, latex, gentamicin, other medicines, foods, dyes or preservatives -pregnant or trying to get pregnant -breast-feeding How should I use this medicine? This vaccine is for injection into a muscle. It is given by a health care professional. A copy of Vaccine Information Statements will be given before each vaccination. Read this sheet carefully each time. The sheet may change frequently. Talk to your pediatrician regarding the use of this medicine in children. Special care may be needed. Overdosage: If you think you have taken too much of this medicine contact a poison control center or emergency room at once. NOTE: This medicine is only for you. Do not share this medicine with others. What if I miss a dose? This does not apply. What may interact with this medicine? -chemotherapy or  radiation therapy -medicines that lower your immune system like etanercept, anakinra, infliximab, and adalimumab -medicines that treat or prevent blood clots like warfarin -phenytoin -steroid medicines like prednisone or cortisone -theophylline -vaccines This list may not describe all possible interactions. Give your health care provider a list of all the medicines, herbs, non-prescription drugs, or dietary supplements you use. Also tell them if you smoke, drink alcohol, or use illegal drugs. Some items may interact with your medicine. What should I watch for while using this medicine? Report any side effects that do not go away within 3 days to your doctor or health care professional. Call your health care provider if any unusual symptoms occur within 6 weeks of receiving this vaccine. You may still catch the flu, but the illness is not usually as bad. You cannot get the flu from the vaccine. The vaccine will not protect against colds or other illnesses that may cause fever. The vaccine is needed every year. What side effects may I notice from receiving this medicine? Side effects that you should report to your doctor or health care professional as soon as possible: -allergic reactions like skin rash, itching or hives, swelling of the face, lips, or tongue Side effects that usually do not require medical attention (report to your doctor or health care professional if they continue or are bothersome): -fever -headache -muscle aches and pains -pain, tenderness, redness, or swelling at site where injected -weak or tired This list may not describe all possible side effects. Call your doctor for medical advice about side effects. You may report side effects to FDA at 1-800-FDA-1088. Where should I keep my medicine? This  vaccine is only given in a clinic, pharmacy, doctor's office, or other health care setting and will not be stored at home. NOTE: This sheet is a summary. It may not cover all  possible information. If you have questions about this medicine, talk to your doctor, pharmacist, or health care provider.  2018 Elsevier/Gold Standard (2007-12-15 09:30:40)

## 2018-04-06 ENCOUNTER — Encounter: Payer: Self-pay | Admitting: Gastroenterology

## 2018-04-08 ENCOUNTER — Telehealth: Payer: Self-pay | Admitting: Internal Medicine

## 2018-04-08 NOTE — Telephone Encounter (Signed)
Pt came in to request a medication change, she states that she began -hydrochlorothiazide (HYDRODIURIL) 25 MG tablet  Once and it caused headaches and so she has discontinued use, she would like to go back to -hydrochlorothiazide (HYDRODIURIL) 12.5 MG tablet  Please advice

## 2018-04-08 NOTE — Telephone Encounter (Signed)
Left message on voicemail to return call.

## 2018-04-08 NOTE — Telephone Encounter (Signed)
Pt took HCTZ 25 mg  X 1 day last week and noticed a headache. She has since reverted back to HCTZ 12.5mg .  Blurred vision related to cataracts, No swelling assessed in BLE  Blood pressure is 157/83 while in office.  This morning at home BP reading was 138/82  Please advise.

## 2018-04-09 NOTE — Telephone Encounter (Signed)
Left a message on patient voicemail to return call 

## 2018-04-13 NOTE — Telephone Encounter (Signed)
Pt name and DOB verified. Although patient was Informed of the message per Dr. [name], Pt states she does not want to take a medication that she has side effects from. She states she will discuss this with Dr. [name] at the appointment she has schedule prior to on Thursday.

## 2018-04-15 ENCOUNTER — Ambulatory Visit: Payer: Self-pay | Admitting: Internal Medicine

## 2018-04-23 ENCOUNTER — Ambulatory Visit: Payer: Self-pay | Attending: Internal Medicine | Admitting: Physician Assistant

## 2018-04-23 VITALS — BP 154/84 | HR 58 | Temp 97.5°F | Ht 60.0 in | Wt 157.4 lb

## 2018-04-23 DIAGNOSIS — Z791 Long term (current) use of non-steroidal anti-inflammatories (NSAID): Secondary | ICD-10-CM | POA: Insufficient documentation

## 2018-04-23 DIAGNOSIS — E1165 Type 2 diabetes mellitus with hyperglycemia: Secondary | ICD-10-CM | POA: Insufficient documentation

## 2018-04-23 DIAGNOSIS — I10 Essential (primary) hypertension: Secondary | ICD-10-CM | POA: Insufficient documentation

## 2018-04-23 DIAGNOSIS — E1169 Type 2 diabetes mellitus with other specified complication: Secondary | ICD-10-CM

## 2018-04-23 DIAGNOSIS — E785 Hyperlipidemia, unspecified: Secondary | ICD-10-CM

## 2018-04-23 DIAGNOSIS — Z7984 Long term (current) use of oral hypoglycemic drugs: Secondary | ICD-10-CM | POA: Insufficient documentation

## 2018-04-23 DIAGNOSIS — Z7982 Long term (current) use of aspirin: Secondary | ICD-10-CM | POA: Insufficient documentation

## 2018-04-23 DIAGNOSIS — E119 Type 2 diabetes mellitus without complications: Secondary | ICD-10-CM

## 2018-04-23 LAB — POCT GLYCOSYLATED HEMOGLOBIN (HGB A1C): HEMOGLOBIN A1C: 5.9 % — AB (ref 4.0–5.6)

## 2018-04-23 LAB — GLUCOSE, POCT (MANUAL RESULT ENTRY): POC GLUCOSE: 94 mg/dL (ref 70–99)

## 2018-04-23 MED ORDER — HYDROCHLOROTHIAZIDE 25 MG PO TABS: 25.0000 mg | ORAL_TABLET | Freq: Every day | ORAL | 6 refills | Status: DC

## 2018-04-23 MED ORDER — NIFEDIPINE ER OSMOTIC RELEASE 30 MG PO TB24: 30.0000 mg | ORAL_TABLET | Freq: Every day | ORAL | 11 refills | Status: DC

## 2018-04-23 MED ORDER — GLIMEPIRIDE 2 MG PO TABS: ORAL_TABLET | ORAL | 11 refills | Status: DC

## 2018-04-23 MED ORDER — ATORVASTATIN CALCIUM 40 MG PO TABS: 40.0000 mg | ORAL_TABLET | Freq: Every day | ORAL | 11 refills | Status: DC

## 2018-04-23 NOTE — Progress Notes (Signed)
Patient ID: [name], female   DOB: 02-28-1951, 67 y.o.   MRN: [id number]   [name], is a 67 y.o. female  UDJ:[id number]  HYI:[id number]  DOB - 08/20/1950  Subjective:  Chief Complaint and HPI: [name] is a 67 y.o. female here today  With BP med concerns.  When she increased her dose of HCTZ to 40m she felt like it gave her a HA, so she hasn't taken any more of it.  She denies CP/SOB/HA.    ROS:   Constitutional:  No f/c, No night sweats, No unexplained weight loss. EENT:  No vision changes, No blurry vision, No hearing changes. No mouth, throat, or ear problems.  Respiratory: No cough, No SOB Cardiac: No CP, no palpitations GI:  No abd pain, No N/V/D. GU: No Urinary s/sx Musculoskeletal: No joint pain Neuro: No headache, no dizziness, no motor weakness.  Skin: No rash Endocrine:  No polydipsia. No polyuria.  Psych: Denies SI/HI  No problems updated.  ALLERGIES: No Known Allergies  PAST MEDICAL HISTORY: Past Medical History:  Diagnosis Date  . [place] complication [place]   . Hypertension 2006  . Malaria 09/20/2014     MEDICATIONS AT HOME: Prior to Admission medications   Medication Sig Start Date End Date Taking? Authorizing Provider  aspirin EC 81 MG tablet Take 1 tablet (81 mg total) by mouth daily. 12/29/16  Yes Funches, Josalyn, MD  atorvastatin (LIPITOR) 40 MG tablet Take 1 tablet (40 mg total) by mouth daily. 11/26/17  Yes Vastie Douty M, PA-C  Blood Glucose Monitoring Suppl (TRUE METRIX METER) W/DEVICE KIT 1 each by Does not apply route as needed. 03/13/15  Yes Funches, Josalyn, MD  glimepiride (AMARYL) 2 MG tablet TAKE 1 TABLET BY MOUTH DAILY BEFORE BREAKFAST. 11/26/17  Yes Rally Ouch M, PA-C  Glucosamine-Chondroitin 750-600 MG TABS Take 2 tablets by mouth daily. 05/16/16  Yes Funches, Josalyn, MD  glucose blood (TRUE METRIX BLOOD GLUCOSE TEST) test strip 1 each by Other route daily. 12/29/16  Yes Funches, Josalyn, MD    hydrochlorothiazide (HYDRODIURIL) 25 MG tablet Take 1 tablet (25 mg total) by mouth daily. 04/23/18  Yes MFreeman CaldronM, PA-C  ibuprofen (ADVIL,MOTRIN) 400 MG tablet Take 400 mg by mouth every 6 (six) hours as needed.   Yes [provider]  NIFEdipine (PROCARDIA-XL/NIFEDICAL-XL) 30 MG 24 hr tablet Take 1 tablet (30 mg total) by mouth daily. 04/23/18  Yes Jesstin Studstill, ADionne Bucy PA-C  TRUEPLUS LANCETS 28G MISC 1 each by Does not apply route daily. 12/29/16  Yes Funches, Josalyn, MD  Turmeric 500 MG CAPS Take 500 mg by mouth daily. 05/16/16  Yes Funches, JAdriana Mccallum MD     Objective:  EXAM:   Vitals:   04/23/18 1412  BP: (!) 154/84  Pulse: (!) 58  Temp: (!) 97.5 F (36.4 C)  TempSrc: Oral  SpO2: 99%  Weight: 157 lb 6.4 oz (71.4 kg)  Height: 5' (1.524 m)    General appearance : A&OX3. NAD. Non-toxic-appearing HEENT: Atraumatic and Normocephalic.  PERRLA. EOM intact.  Neck: supple, no JVD. No cervical lymphadenopathy. No thyromegaly Chest/Lungs:  Breathing-non-labored, Good air entry bilaterally, breath sounds normal without rales, rhonchi, or wheezing  CVS: S1 S2 regular, no murmurs, gallops, rubs  Extremities: Bilateral Lower Ext shows no edema, both legs are warm to touch with = pulse throughout Neurology:  CN II-XII grossly intact, Non focal.   Psych:  TP linear. J/I WNL. Normal speech. Appropriate eye contact and affect.  Skin:  No Rash  Data Review Lab Results  Component Value Date   HGBA1C 5.9 (A) 04/23/2018   HGBA1C 5.9 11/26/2017   HGBA1C 5.9 (H) 07/16/2017     Assessment & Plan   1. Essential hypertension Uncontrolled but not taking meds.  I reassured her that going from 12.34m to 263mon HCTZ would not cause her to have HA.  Resume meds at prescibed doses - NIFEdipine (PROCARDIA-XL/NIFEDICAL-XL) 30 MG 24 hr tablet; Take 1 tablet (30 mg total) by mouth daily.  Dispense: 30 tablet; Refill: 11 - hydrochlorothiazide (HYDRODIURIL) 25 MG tablet; Take 1 tablet (25  mg total) by mouth daily.  Dispense: 30 tablet; Refill: 6 - Basic metabolic panel; Future Check BP daily and record and bring to next visit.  Nurse visit in 2-3 weeks for labs and BP check  2. Controlled type 2 diabetes mellitus with hyperglycemia, without long-term current use of insulin (HCC) Controlled-continue current regimen - Glucose (CBG) - HgB A1c  Patient have been counseled extensively about nutrition and exercise  Return in about 3 weeks (around 05/14/2018) for Nurse visit BP check and lab appt.  The patient was given clear instructions to go to ER or return to medical center if symptoms don't improve, worsen or new problems develop. The patient verbalized understanding. The patient was told to call to get lab results if they haven't heard anything in the next week.     AnFreeman CaldronPA-C [facility] 04/23/2018, 2:37 PM

## 2018-04-23 NOTE — Progress Notes (Signed)
Patient wants to discuss BP medication.

## 2018-04-23 NOTE — Patient Instructions (Signed)
Check blood pressure once daily and record.  Bring these numbers in for your next visit.  Our goal is for your Blood pressure to be <135/85 after you have been on your medication a couple of weeks.

## 2018-05-05 ENCOUNTER — Other Ambulatory Visit (HOSPITAL_COMMUNITY): Payer: Self-pay | Admitting: *Deleted

## 2018-05-05 DIAGNOSIS — R921 Mammographic calcification found on diagnostic imaging of breast: Secondary | ICD-10-CM

## 2018-05-06 ENCOUNTER — Ambulatory Visit: Payer: Self-pay | Attending: Internal Medicine

## 2018-05-06 ENCOUNTER — Ambulatory Visit (AMBULATORY_SURGERY_CENTER): Payer: Self-pay

## 2018-05-06 ENCOUNTER — Encounter (INDEPENDENT_AMBULATORY_CARE_PROVIDER_SITE_OTHER): Payer: Self-pay

## 2018-05-06 VITALS — Ht <= 58 in | Wt 151.8 lb

## 2018-05-06 DIAGNOSIS — I10 Essential (primary) hypertension: Secondary | ICD-10-CM

## 2018-05-06 DIAGNOSIS — Z1211 Encounter for screening for malignant neoplasm of colon: Secondary | ICD-10-CM

## 2018-05-06 MED ORDER — POLYETHYLENE GLYCOL 3350 17 GM/SCOOP PO POWD: 1.0000 | Freq: Once | ORAL | 0 refills | Status: AC

## 2018-05-06 MED ORDER — BISACODYL 5 MG PO TBEC: 5.0000 mg | DELAYED_RELEASE_TABLET | Freq: Once | ORAL | 0 refills | Status: AC

## 2018-05-06 NOTE — Progress Notes (Signed)
Denies allergies to eggs or soy products. Denies complication of anesthesia or sedation. Denies use of weight loss medication. Denies use of O2.   Emmi instructions declined.   Miralax and Dulcolax prescriptions were sent to the [facility]. Extra time was spent with the patient helping her understand her instructions. Patient was also instructed that she must have a care partner here at all times. Patient verbalizes understanding.

## 2018-05-07 ENCOUNTER — Ambulatory Visit: Payer: Self-pay | Admitting: Internal Medicine

## 2018-05-07 LAB — BASIC METABOLIC PANEL
BUN/Creatinine Ratio: 20 (ref 12–28)
BUN: 14 mg/dL (ref 8–27)
CO2: 24 mmol/L (ref 20–29)
CREATININE: 0.7 mg/dL (ref 0.57–1.00)
Calcium: 10.5 mg/dL — ABNORMAL HIGH (ref 8.7–10.3)
Chloride: 97 mmol/L (ref 96–106)
GFR calc Af Amer: 104 mL/min/{1.73_m2} (ref >59)
GFR calc non Af Amer: 91 mL/min/{1.73_m2} (ref >59)
GLUCOSE: 89 mg/dL (ref 65–99)
Potassium: 4 mmol/L (ref 3.5–5.2)
SODIUM: 140 mmol/L (ref 134–144)

## 2018-05-10 ENCOUNTER — Telehealth: Payer: Self-pay | Admitting: *Deleted

## 2018-05-10 NOTE — Telephone Encounter (Signed)
-----   Message from [place] Donovan, [place] sent at 05/08/2018 10:46 AM EST ----- Please call patient.  Labs are normal.  Follow-up as planned.  Thanks, Freeman Caldron, PA-C

## 2018-05-10 NOTE — Telephone Encounter (Signed)
Patient verified DOB Patient is aware of labs being normal and to follow up as planned. No further questions.

## 2018-05-20 ENCOUNTER — Ambulatory Visit (AMBULATORY_SURGERY_CENTER): Payer: Self-pay | Admitting: Gastroenterology

## 2018-05-20 ENCOUNTER — Encounter: Payer: Self-pay | Admitting: Gastroenterology

## 2018-05-20 VITALS — BP 148/77 | HR 50 | Temp 98.0°F | Resp 13 | Ht <= 58 in | Wt 151.0 lb

## 2018-05-20 DIAGNOSIS — D122 Benign neoplasm of ascending colon: Secondary | ICD-10-CM

## 2018-05-20 DIAGNOSIS — Z1211 Encounter for screening for malignant neoplasm of colon: Secondary | ICD-10-CM

## 2018-05-20 MED ORDER — SODIUM CHLORIDE 0.9 % IV SOLN: 500.0000 mL | Freq: Once | INTRAVENOUS | Status: AC

## 2018-05-20 NOTE — Op Note (Signed)
[place] Patient Name: [name] Procedure Date: 05/20/2018 10:11 AM MRN: [id number] Endoscopist: Thornton Park MD, MD Age: 67 Referring MD:  Date of Birth: 1951-02-03 Gender: Female Account #: [id number] Procedure:                Colonoscopy Indications:              Screening for colorectal malignant neoplasm, This                            is the patient's first colonoscopy. No baseline GI                            symptoms. No known family history of colon cancer                            or polyps. Medicines:                See the Anesthesia note for documentation of the                            administered medications Procedure:                Pre-Anesthesia Assessment:                           - Prior to the procedure, a History and Physical                            was performed, and patient medications and                            allergies were reviewed. The patient's tolerance of                            previous anesthesia was also reviewed. The risks                            and benefits of the procedure and the sedation                            options and risks were discussed with the patient.                            All questions were answered, and informed consent                            was obtained. Prior Anticoagulants: The patient has                            taken no previous anticoagulant or antiplatelet                            agents. ASA Grade Assessment: II - A patient with  mild systemic disease. After reviewing the risks                            and benefits, the patient was deemed in                            satisfactory condition to undergo the procedure.                           After obtaining informed consent, the colonoscope                            was passed under direct vision. Throughout the                            procedure, the patient's blood pressure, pulse,  and                            oxygen saturations were monitored continuously. The                            Colonoscope was introduced through the anus and                            advanced to the the terminal ileum, with                            identification of the appendiceal orifice and IC                            valve. The colonoscopy was performed without                            difficulty. The patient tolerated the procedure                            well. The quality of the bowel preparation was good. Scope In: 10:18:31 AM Scope Out: 10:30:42 AM Scope Withdrawal Time: 0 hours 9 minutes 24 seconds  Total Procedure Duration: 0 hours 12 minutes 11 seconds  Findings:                 The perianal and digital rectal examinations were                            normal.                           Non-bleeding internal hemorrhoids were found. The                            hemorrhoids were small.                           A 4 mm polyp was found in the distal ascending  colon. The polyp was sessile. The polyp was removed                            with a cold snare. Resection and retrieval were                            complete.                           A 2 mm polyp was found in the proximal ascending                            colon. The polyp was flat. The snare would slide                            over it despite multiple attempts. The polyp was                            removed with a cold biopsy forceps. Resection and                            retrieval were complete. Estimated blood loss was                            minimal.                           The exam was otherwise without abnormality on                            direct and retroflexion views. Complications:            No immediate complications. Estimated Blood Loss:     Estimated blood loss was minimal. Impression:               - Non-bleeding internal hemorrhoids.                            - One 4 mm polyp in the distal ascending colon,                            removed with a cold snare. Resected and retrieved.                           - One 2 mm polyp in the proximal ascending colon,                            removed with a cold biopsy forceps. Resected and                            retrieved.                           - The examination was otherwise normal on direct  and retroflexion views. Recommendation:           - Discharge patient to home.                           - Resume regular diet today.                           - Continue present medications.                           - Await pathology results.                           - Repeat colonoscopy in 5 years for surveillance if                            at least one polyp is adenomatous. Thornton Park MD, MD 05/20/2018 10:36:18 AM This report has been signed electronically.

## 2018-05-20 NOTE — Progress Notes (Signed)
Called to room to assist during endoscopic procedure.  Patient ID and intended procedure confirmed with present staff. Received instructions for my participation in the procedure from the performing physician.  

## 2018-05-20 NOTE — Progress Notes (Signed)
PT taken to PACU. Monitors in place. VSS. Report given to RN. 

## 2018-05-20 NOTE — Patient Instructions (Signed)
YOU HAD AN ENDOSCOPIC PROCEDURE TODAY AT THE [facility]:   Refer to the procedure report that was given to you for any specific questions about what was found during the examination.  If the procedure report does not answer your questions, please call your gastroenterologist to clarify.  If you requested that your care partner not be given the details of your procedure findings, then the procedure report has been included in a sealed envelope for you to review at your convenience later.  YOU SHOULD EXPECT: Some feelings of bloating in the abdomen. Passage of more gas than usual.  Walking can help get rid of the air that was put into your GI tract during the procedure and reduce the bloating. If you had a lower endoscopy (such as a colonoscopy or flexible sigmoidoscopy) you may notice spotting of blood in your stool or on the toilet paper. If you underwent a bowel prep for your procedure, you may not have a normal bowel movement for a few days.  Please Note:  You might notice some irritation and congestion in your nose or some drainage.  This is from the oxygen used during your procedure.  There is no need for concern and it should clear up in a day or so.  SYMPTOMS TO REPORT IMMEDIATELY:   Following lower endoscopy (colonoscopy or flexible sigmoidoscopy):  Excessive amounts of blood in the stool  Significant tenderness or worsening of abdominal pains  Swelling of the abdomen that is new, acute  Fever of 100F or higher  For urgent or emergent issues, a gastroenterologist can be reached at any hour by calling [phone].   DIET:  We do recommend a small meal at first, but then you may proceed to your regular diet.  Drink plenty of fluids but you should avoid alcoholic beverages for 24 hours.  MEDICATIONS: Continue present medications.  Please see handouts given to you by your recovery nurse.  ACTIVITY:  You should plan to take it easy for the rest of today and you should NOT  DRIVE or use heavy machinery until tomorrow (because of the sedation medicines used during the test).    FOLLOW UP: Our staff will call the number listed on your records the next business day following your procedure to check on you and address any questions or concerns that you may have regarding the information given to you following your procedure. If we do not reach you, we will leave a message.  However, if you are feeling well and you are not experiencing any problems, there is no need to return our call.  We will assume that you have returned to your regular daily activities without incident.  If any biopsies were taken you will be contacted by phone or by letter within the next 1-3 weeks.  Please call us at [phone] if you have not heard about the biopsies in 3 weeks.   Thank you for allowing us to provide for your healthcare needs today.   SIGNATURES/CONFIDENTIALITY: You and/or your care partner have signed paperwork which will be entered into your electronic medical record.  These signatures attest to the fact that that the information above on your After Visit Summary has been reviewed and is understood.  Full responsibility of the confidentiality of this discharge information lies with you and/or your care-partner. 

## 2018-05-21 ENCOUNTER — Telehealth: Payer: Self-pay

## 2018-05-21 NOTE — Telephone Encounter (Signed)
No answer. Patient said no to leaving a message. No voice mail.

## 2018-05-27 ENCOUNTER — Encounter: Payer: Self-pay | Admitting: Gastroenterology

## 2018-07-01 ENCOUNTER — Ambulatory Visit
Admission: RE | Admit: 2018-07-01 | Discharge: 2018-07-01 | Disposition: A | Discharge status: Home / Self Care | Payer: No Typology Code available for payment source | Source: Ambulatory Visit | Attending: Obstetrics and Gynecology | Admitting: Obstetrics and Gynecology

## 2018-07-01 ENCOUNTER — Ambulatory Visit (HOSPITAL_COMMUNITY)
Admission: RE | Admit: 2018-07-01 | Discharge: 2018-07-01 | Disposition: A | Discharge status: Home / Self Care | Payer: Self-pay | Source: Ambulatory Visit | Attending: Obstetrics and Gynecology | Admitting: Obstetrics and Gynecology

## 2018-07-01 ENCOUNTER — Encounter (HOSPITAL_COMMUNITY): Payer: Self-pay

## 2018-07-01 ENCOUNTER — Encounter (HOSPITAL_COMMUNITY): Payer: Self-pay | Admitting: *Deleted

## 2018-07-01 ENCOUNTER — Ambulatory Visit: Admission: RE | Admit: 2018-07-01 | Payer: Self-pay | Source: Ambulatory Visit

## 2018-07-01 VITALS — BP 130/80 | Wt 155.0 lb

## 2018-07-01 DIAGNOSIS — R921 Mammographic calcification found on diagnostic imaging of breast: Secondary | ICD-10-CM

## 2018-07-01 DIAGNOSIS — Z1239 Encounter for other screening for malignant neoplasm of breast: Secondary | ICD-10-CM

## 2018-07-01 NOTE — Addendum Note (Signed)
Encounter addended by: Loletta Parish, RN on: 07/01/2018 1:26 PM  Actions taken: Clinical Note Signed

## 2018-07-01 NOTE — Patient Instructions (Signed)
Explained breast self awareness with Aretta Nip. Patient did not need a Pap smear today due to last Pap smear and HPV typing was 11/29/2014. Let her know BCCCP will cover Pap smears and HPV typing every 5 years unless has a history of abnormal Pap smears. Referred patient to the [facility] for a diagnostic mammogram per recommendation. Appointment scheduled for Thursday, July 01, 2018 at [place]. Patient aware of appointment and will be there. Zarie Kosiba verbalized understanding.  Valeen Borys, Arvil Chaco, RN 9:18 AM

## 2018-07-01 NOTE — Progress Notes (Addendum)
Patient referred to [facility] due to patient had a diagnostic mammogram 12/23/2017 that 87-month follow-up for left breast recommended.  Pap Smear: Pap smear not completed today. Last Pap smear was 11/29/2014 at [facility] and normal with negative HPV. Per patient has no history of an abnormal Pap smear. Last Pap smear result is in Epic.  Physical exam: Breasts Left breast slightly larger than right breast that per patient has not noticed any changes. No skin abnormalities bilateral breasts. No nipple retraction bilateral breasts. No nipple discharge bilateral breasts. No lymphadenopathy. No lumps palpated bilateral breasts. No complaints of pain or tenderness on exam. Referred patient to the [facility] for a diagnostic mammogram per recommendation. Appointment scheduled for Thursday, July 01, 2018 at [place].        Pelvic/Bimanual No Pap smear completed today since last Pap smear and HPV typing was 11/29/2014. Pap smear not indicated per BCCCP guidelines.   Smoking History: Patient has never smoked.  Patient Navigation: Patient education provided. Access to services provided for patient through [facility] program. Taxi voucher provided to get to appointment at the [facility].  Colorectal Cancer Screening: Patient had a colonoscopy completed 05/20/2018. FIT Test completed 05/29/2017 that was negative. No complaints today.   Breast and Cervical Cancer Risk Assessment: Patient has no family history of breast cancer, known genetic mutations, or radiation treatment to the chest before age 49. Patient has no history of cervical dysplasia, immunocompromised, or DES exposure in-utero.  Risk Assessment    Risk Scores      07/01/2018   Last edited by: Armond Hang, LPN   5-year risk: 1.7 %   Lifetime risk: 5.7 %

## 2018-07-12 ENCOUNTER — Ambulatory Visit: Payer: Self-pay | Attending: Internal Medicine

## 2018-07-19 ENCOUNTER — Telehealth: Payer: Self-pay | Admitting: Internal Medicine

## 2018-07-19 NOTE — Telephone Encounter (Signed)
1) Medication(s) Requested (by name): Nifedipine Hydrochlorothiazide atorvastatin  2) Pharmacy of Choice:  [facility]

## 2018-07-20 NOTE — Telephone Encounter (Signed)
Pt had refills available at pharmacy, refills being worked on. No further action is required.

## 2018-07-29 ENCOUNTER — Ambulatory Visit: Payer: Self-pay | Attending: Internal Medicine | Admitting: Internal Medicine

## 2018-07-29 ENCOUNTER — Encounter: Payer: Self-pay | Admitting: Internal Medicine

## 2018-07-29 VITALS — BP 132/79 | HR 64 | Temp 97.9°F | Resp 16 | Ht <= 58 in | Wt 153.2 lb

## 2018-07-29 DIAGNOSIS — E785 Hyperlipidemia, unspecified: Secondary | ICD-10-CM

## 2018-07-29 DIAGNOSIS — E1169 Type 2 diabetes mellitus with other specified complication: Secondary | ICD-10-CM

## 2018-07-29 DIAGNOSIS — L84 Corns and callosities: Secondary | ICD-10-CM

## 2018-07-29 DIAGNOSIS — I10 Essential (primary) hypertension: Secondary | ICD-10-CM

## 2018-07-29 DIAGNOSIS — E119 Type 2 diabetes mellitus without complications: Secondary | ICD-10-CM

## 2018-07-29 DIAGNOSIS — D126 Benign neoplasm of colon, unspecified: Secondary | ICD-10-CM

## 2018-07-29 DIAGNOSIS — E1165 Type 2 diabetes mellitus with hyperglycemia: Secondary | ICD-10-CM

## 2018-07-29 LAB — GLUCOSE, POCT (MANUAL RESULT ENTRY): POC Glucose: 96 mg/dl (ref 70–99)

## 2018-07-29 NOTE — Progress Notes (Signed)
Patient ID: [name], female    DOB: 06-26-1950  MRN: [id number]  CC: No chief complaint on file.   Subjective: [name] is a 68 y.o. female who presents for chronic ds management Her concerns today include:  Patient with history of DM, HTN, HL, OA.  HYPERTENSION Currently taking: see medication list.  She has not taken medications as yet for today because she had an eye appointment early this morning Med Adherence: '[x]'$  Yes    '[]'$  No Medication side effects: '[]'$  Yes    '[x]'$  No Adherence with salt restriction: '[x]'$  Yes    '[]'$  No Home Monitoring?: '[x]'$  Yes    '[]'$  No Monitoring Frequency: once a wk Home BP results range: 130-132/70-75 SOB? '[]'$  Yes    '[x]'$  No Chest Pain?: '[]'$  Yes    '[x]'$  No Leg swelling?: '[]'$  Yes    '[x]'$  No Headaches?: '[]'$  Yes    '[x]'$  No Dizziness? '[]'$  Yes    '[x]'$  No Comments:   DIABETES TYPE 2 Last A1C:   Results for orders placed or performed in visit on 07/29/18  POCT glucose (manual entry)  Result Value Ref Range   POC Glucose 96 70 - 99 mg/dl    Med Adherence:  '[x]'$  Yes    '[]'$  No Medication side effects:  '[]'$  Yes    '[x]'$  No Home Monitoring?  '[x]'$  Yes, 2-3 x a wk Home glucose results range:98-105 Diet Adherence: '[x]'$  Yes - "Take a lot of vegetables and fruits."    '[]'$  No Exercise: '[x]'$  Yes, uses her glader 4 x a wk    '[]'$  No Hypoglycemic episodes?: '[]'$  Yes    '[x]'$  No Numbness of the feet? '[]'$  Yes    '[x]'$  No.  Little swelling and pain at times in ankles Retinopathy hx? '[]'$  Yes    '[x]'$  No Last eye exam: 07/29/2018.  Pos cataracts Comments:   HL: taking Lipitor.  My CMA marked that she is not taking but pt confirms that she is taking  HM:  Had c-scope 05/2018.  2 adenomatous polyps removed.  Due for repeat in 5 years. Patient Active Problem List   Diagnosis Date Noted  . Scarring of skin 07/16/2017  . Breast calcification, left 07/16/2017  . Immunization due 04/03/2017  . Hyperlipidemia associated with type 2 diabetes mellitus ([place]) 05/18/2016  . Primary  osteoarthritis involving multiple joints 05/16/2016  . Diabetes type 2, controlled ([place]) 03/13/2015  . 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 Bilateral cataracts 03/13/2015  . Hypertension 10/06/2014     Current Outpatient Medications on File Prior to Visit  Medication Sig Dispense Refill  . atorvastatin (LIPITOR) 40 MG tablet Take 1 tablet (40 mg total) by mouth daily. (Patient not taking: Reported on 07/01/2018) 30 tablet 11  . Blood Glucose Monitoring Suppl (TRUE METRIX METER) W/DEVICE KIT 1 each by Does not apply route as needed. 1 kit 0  . glimepiride (AMARYL) 2 MG tablet TAKE 1 TABLET BY MOUTH DAILY BEFORE BREAKFAST. 30 tablet 11  . Glucosamine-Chondroitin 750-600 MG TABS Take 2 tablets by mouth daily.  0  . glucose blood (TRUE METRIX BLOOD GLUCOSE TEST) test strip 1 each by Other route daily. (Patient not taking: Reported on 07/01/2018) 100 each 3  . hydrochlorothiazide (HYDRODIURIL) 25 MG tablet Take 1 tablet (25 mg total) by mouth daily. 30 tablet 6  . ibuprofen (ADVIL,MOTRIN) 400 MG tablet Take 400 mg by mouth every 6 (six) hours as needed.    Marland Kitchen NIFEdipine (PROCARDIA-XL/NIFEDICAL-XL) 30 MG 24  hr tablet Take 1 tablet (30 mg total) by mouth daily. 30 tablet 11  . TRUEPLUS LANCETS 28G MISC 1 each by Does not apply route daily. 100 each 3  . Turmeric 500 MG CAPS Take 500 mg by mouth daily.     Current Facility-Administered Medications on File Prior to Visit  Medication Dose Route Frequency Provider Last Rate Last Dose  . 0.9 %  sodium chloride infusion  500 mL Intravenous Once [place], MD        No Known Allergies  Social History   Socioeconomic History  . Marital status: Married    Spouse name: Not on file  . Number of children: 3  . Years of education: 35   . Highest education level: Not on file  Occupational History  . Not on file  Social Needs  . Financial resource strain: Not on file  . Food insecurity:    Worry: Not on file    Inability: Not on file  .  Transportation needs:    Medical: Yes    Non-medical: No  Tobacco Use  . Smoking status: Never Smoker  . Smokeless tobacco: Never Used  Substance and Sexual Activity  . Alcohol use: Yes    Comment: occassional  . Drug use: No  . Sexual activity: Never    Birth control/protection: None  Lifestyle  . Physical activity:    Days per week: 7 days    Minutes per session: 30 min  . Stress: Only a little  Relationships  . Social connections:    Talks on phone: More than three times a week    Gets together: More than three times a week    Attends religious service: 1 to 4 times per year    Active member of club or organization: No    Attends meetings of clubs or organizations: Never    Relationship status: Married  . Intimate partner violence:    Fear of current or ex partner: No    Emotionally abused: No    Physically abused: No    Forced sexual activity: No  Other Topics Concern  . Not on file  Social History Narrative   From [place]   Moved to [place] on 09/11/2014      Has 3 children- 1 daughter in [place],    1 child in [place]    Family History  Problem Relation Age of Onset  . Hypertension Mother   . Diabetes Mother   . Cancer Father        prostate  . Diabetes Sister   . Colon cancer Neg Hx   . Esophageal cancer Neg Hx   . Rectal cancer Neg Hx   . Stomach cancer Neg Hx     Past Surgical History:  Procedure Laterality Date  . CESAREAN SECTION  1990    ROS: Review of Systems Negative except as stated above  PHYSICAL EXAM: BP 132/79   Pulse 64   Temp 97.9 F (36.6 C) (Oral)   Resp 16   Ht '4\' 8"'$  (1.422 m)   Wt 153 lb 3.2 oz (69.5 kg)   SpO2 98%   BMI 34.35 kg/m   Wt Readings from Last 3 Encounters:  07/29/18 153 lb 3.2 oz (69.5 kg)  07/01/18 155 lb (70.3 kg)  05/20/18 151 lb (68.5 kg)    Physical Exam  General appearance - alert, well appearing, and in no distress Mental status - normal mood, behavior, speech, dress, motor activity, and thought  processes  Neck - supple, no significant adenopathy Chest - clear to auscultation, no wheezes, rales or rhonchi, symmetric air entry Heart - normal rate, regular rhythm, normal S1, S2, no murmurs, rubs, clicks or gallops Extremities - peripheral pulses normal, no pedal edema, no clubbing or cyanosis Diabetic Foot Exam - Simple   Simple Foot Form Visual Inspection See comments:  Yes Sensation Testing Intact to touch and monofilament testing bilaterally:  Yes Pulse Check Posterior Tibialis and Dorsalis pulse intact bilaterally:  Yes Comments Small callus on the ball of both feet MT.  Bony overgrowth on the lateral aspect of the right midfoot      CMP Latest Ref Rng & Units 05/06/2018 11/26/2017 12/29/2016  Glucose 65 - 99 mg/dL 89 95 69  BUN 8 - 27 mg/dL '14 10 13  '$ Creatinine 0.57 - 1.00 mg/dL 0.70 0.78 0.90  Sodium 134 - 144 mmol/L 140 139 141  Potassium 3.5 - 5.2 mmol/L 4.0 4.2 4.4  Chloride 96 - 106 mmol/L 97 102 100  CO2 20 - 29 mmol/L '24 26 27  '$ Calcium 8.7 - 10.3 mg/dL 10.5(H) 10.1 10.5(H)  Total Protein 6.0 - 8.5 g/dL - 7.4 7.8  Total Bilirubin 0.0 - 1.2 mg/dL - 0.5 0.7  Alkaline Phos 39 - 117 IU/L - 72 88  AST 0 - 40 IU/L - 23 26  ALT 0 - 32 IU/L - 14 16   Lipid Panel     Component Value Date/Time   CHOL 199 11/26/2017 0913   TRIG 74 11/26/2017 0913   HDL 67 11/26/2017 0913   CHOLHDL 3.0 11/26/2017 0913   CHOLHDL 3.3 05/16/2016 0928   VLDL 21 05/16/2016 0928   LDLCALC 117 (H) 11/26/2017 0913    CBC    Component Value Date/Time   WBC 4.6 11/26/2017 0913   WBC 5.2 05/16/2016 0944   RBC 4.31 11/26/2017 0913   RBC 4.72 05/16/2016 0944   HGB 12.9 11/26/2017 0913   HCT 36.9 11/26/2017 0913   PLT 283 11/26/2017 0913   MCV 86 11/26/2017 0913   MCH 29.9 11/26/2017 0913   MCH 29.7 05/16/2016 0944   MCHC 35.0 11/26/2017 0913   MCHC 34.2 05/16/2016 0944   RDW 14.1 11/26/2017 0913   LYMPHSABS 2.0 11/26/2017 0913   MONOABS 0.8 10/06/2014 1049   EOSABS 0.1 11/26/2017  0913   BASOSABS 0.1 11/26/2017 0913    ASSESSMENT AND PLAN: 1. Controlled type 2 diabetes mellitus without complication, without long-term current use of insulin (HCC) At goal.  No Amaryl.  Continue healthy eating habits and regular exercise - POCT glucose (manual entry) - Microalbumin / creatinine urine ratio  2. Essential hypertension Close to goal.  Continue HCTZ and nifedipine  3. Hyperlipidemia associated with type 2 diabetes mellitus (HCC) Continue atorvastatin  4. Pre-ulcerative corn or callous Mild and not ulcerated at this time.   Patient states that she keeps them shaved herself  5. Adenomatous polyp of colon, unspecified part of colon Will be due for repeat colonoscopy in 5 years    Patient was given the opportunity to ask questions.  Patient verbalized understanding of the plan and was able to repeat key elements of the plan.   Orders Placed This Encounter  Procedures  . Microalbumin / creatinine urine ratio  . POCT glucose (manual entry)     Requested Prescriptions    No prescriptions requested or ordered in this encounter    No follow-ups on file.  Karle Plumber, MD, FACP

## 2018-07-29 NOTE — Patient Instructions (Addendum)
Continue your current medications.  Continue to monitor your blood pressure and blood sugars.   Pneumococcal Polysaccharide Vaccine: What You Need to Know 1. Why get vaccinated? Vaccination can protect older adults (and some children and younger adults) from pneumococcal disease. Pneumococcal disease is caused by bacteria that can spread from person to person through close contact. It can cause ear infections, and it can also lead to more serious infections of the:  Lungs (pneumonia),  Blood (bacteremia), and  Covering of the brain and spinal cord (meningitis). Meningitis can cause deafness and brain damage, and it can be fatal. Anyone can get pneumococcal disease, but children under 64 years of age, people with certain medical conditions, adults over 1 years of age, and cigarette smokers are at the highest risk. About 18,000 older adults die each year from pneumococcal disease in the [place]. Treatment of pneumococcal infections with penicillin and other drugs used to be more effective. But some strains of the disease have become resistant to these drugs. This makes prevention of the disease, through vaccination, even more important. 2. Pneumococcal polysaccharide vaccine (PPSV23) Pneumococcal polysaccharide vaccine (PPSV23) protects against 23 types of pneumococcal bacteria. It will not prevent all pneumococcal disease. PPSV23 is recommended for:  All adults 40 years of age and older,  Anyone 2 through 67 years of age with certain long-term health problems,  Anyone 2 through 67 years of age with a weakened immune system,  Adults 6 through 67 years of age who smoke cigarettes or have asthma. Most people need only one dose of PPSV. A second dose is recommended for certain high-risk groups. People 66 and older should get a dose even if they have gotten one or more doses of the vaccine before they turned 65. Your healthcare provider can give you more information about these  recommendations. Most healthy adults develop protection within 2 to 3 weeks of getting the shot. 3. Some people should not get this vaccine  Anyone who has had a life-threatening allergic reaction to PPSV should not get another dose.  Anyone who has a severe allergy to any component of PPSV should not receive it. Tell your provider if you have any severe allergies.  Anyone who is moderately or severely ill when the shot is scheduled may be asked to wait until they recover before getting the vaccine. Someone with a mild illness can usually be vaccinated.  Children less than 16 years of age should not receive this vaccine.  There is no evidence that PPSV is harmful to either a pregnant woman or to her fetus. However, as a precaution, women who need the vaccine should be vaccinated before becoming pregnant, if possible. 4. Risks of a vaccine reaction With any medicine, including vaccines, there is a chance of side effects. These are usually mild and go away on their own, but serious reactions are also possible. About half of people who get PPSV have mild side effects, such as redness or pain where the shot is given, which go away within about two days. Less than 1 out of 100 people develop a fever, muscle aches, or more severe local reactions. Problems that could happen after any vaccine:  People sometimes faint after a medical procedure, including vaccination. Sitting or lying down for about 15 minutes can help prevent fainting, and injuries caused by a fall. Tell your doctor if you feel dizzy, or have vision changes or ringing in the ears.  Some people get severe pain in the shoulder and have difficulty  moving the arm where a shot was given. This happens very rarely.  Any medication can cause a severe allergic reaction. Such reactions from a vaccine are very rare, estimated at about 1 in a million doses, and would happen within a few minutes to a few hours after the vaccination. As with any  medicine, there is a very remote chance of a vaccine causing a serious injury or death. The safety of vaccines is always being monitored. For more information, visit: http://www.aguilar.org/ 5. What if there is a serious reaction? What should I look for? Look for anything that concerns you, such as signs of a severe allergic reaction, very high fever, or unusual behavior. Signs of a severe allergic reaction can include hives, swelling of the face and throat, difficulty breathing, a fast heartbeat, dizziness, and weakness. These would usually start a few minutes to a few hours after the vaccination. What should I do? If you think it is a severe allergic reaction or other emergency that can't wait, call 9-1-1 or get to the nearest hospital. Otherwise, call your doctor. Afterward, the reaction should be reported to the Vaccine Adverse Event Reporting System (VAERS). Your doctor might file this report, or you can do it yourself through the VAERS web site at www.vaers.SamedayNews.es, or by calling 787-685-7884. VAERS does not give medical advice. 6. How can I learn more?  Ask your doctor. He or she can give you the vaccine package insert or suggest other sources of information.  Call your local or state health department.  Contact the Centers for Disease Control and Prevention (CDC): ? Call 229-342-2822 (1-800-CDC-INFO) or ? Visit CDC's website at http://hunter.com/ CDC Vaccine Information Statement PPSV Vaccine (09/23/2013) This information is not intended to replace advice given to you by your health care provider. Make sure you discuss any questions you have with your health care provider. Document Released: 03/16/2006 Document Revised: 12/29/2017 Document Reviewed: 12/29/2017 Elsevier Interactive Patient Education  2019 [employer].

## 2019-02-02 IMAGING — [facility] DIGITAL DIAGNOSTIC BILATERAL MAMMOGRAM WITH TOMO AND CAD
6 of 10 series · 6 of 26 positions shown · non-contrast
Comparison: Previous exam(s).

CLINICAL DATA: Follow-up probably benign left breast
calcifications.

EXAM:
DIGITAL DIAGNOSTIC BILATERAL MAMMOGRAM WITH CAD AND TOMO

[L ML]
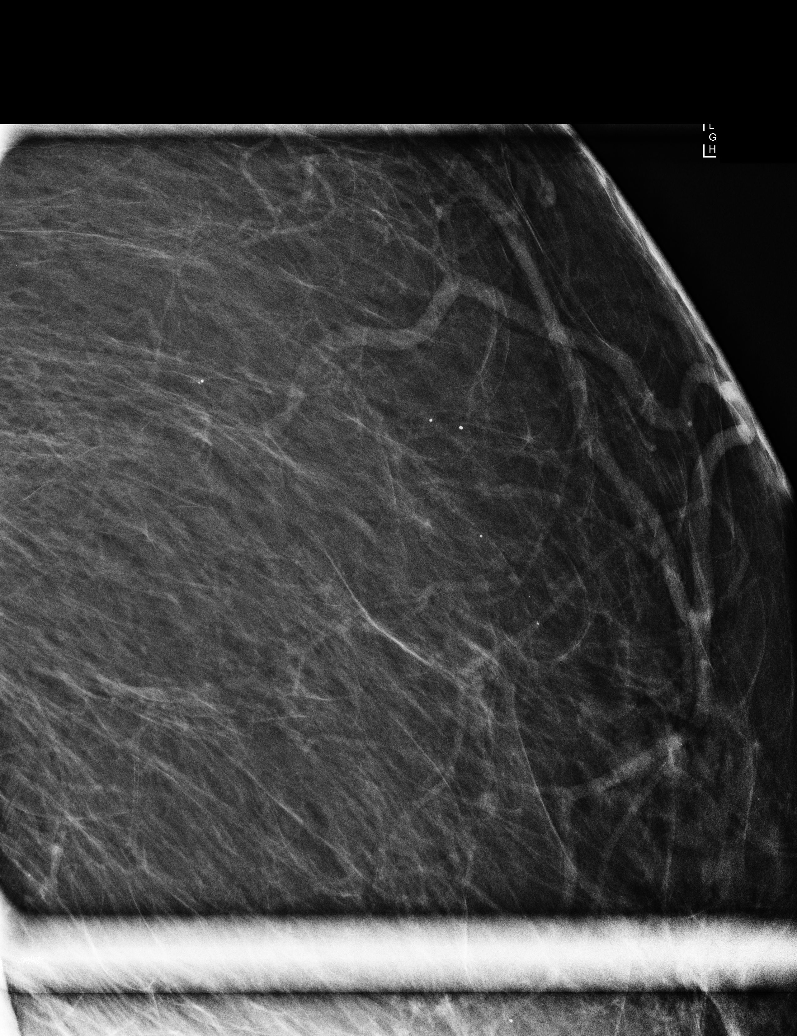

[L CC]
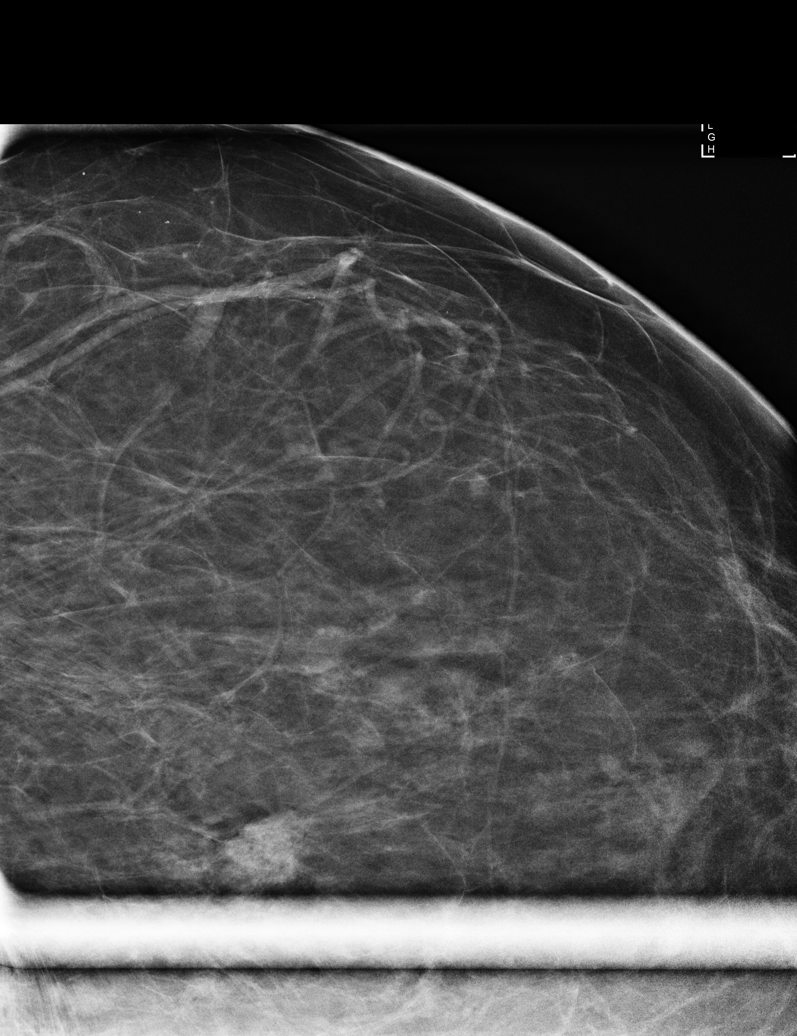

[R MLO synth-2D]
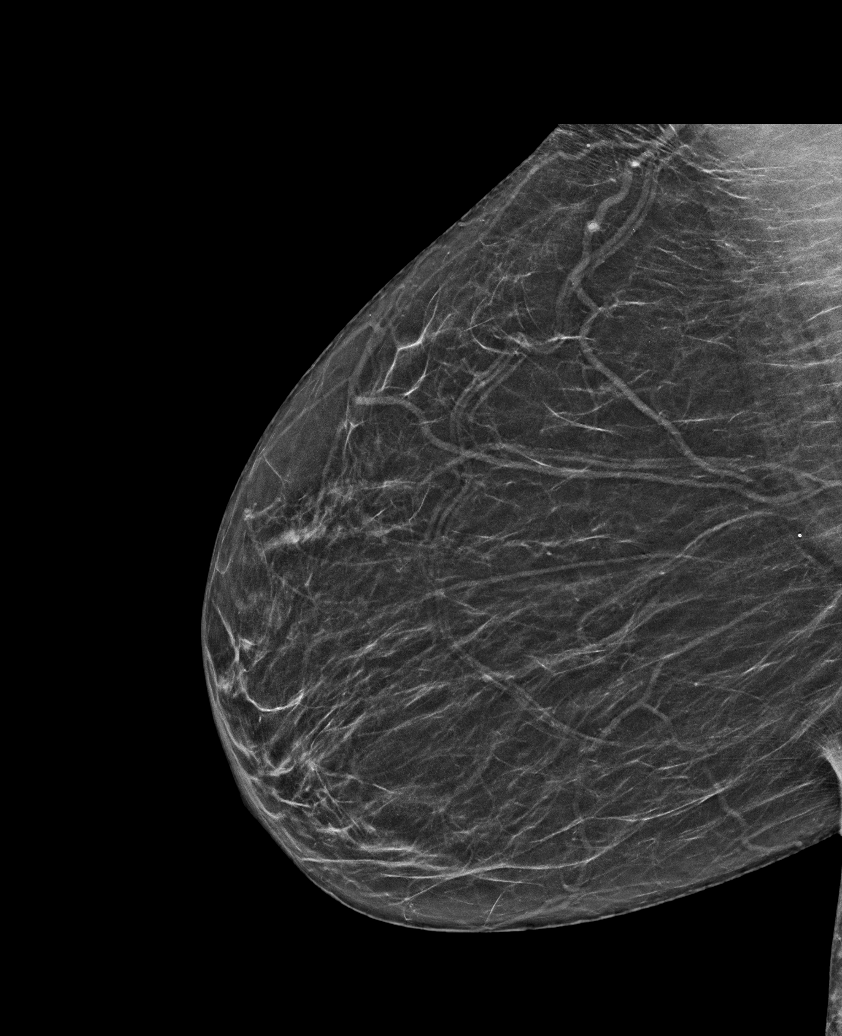

[R CC synth-2D]
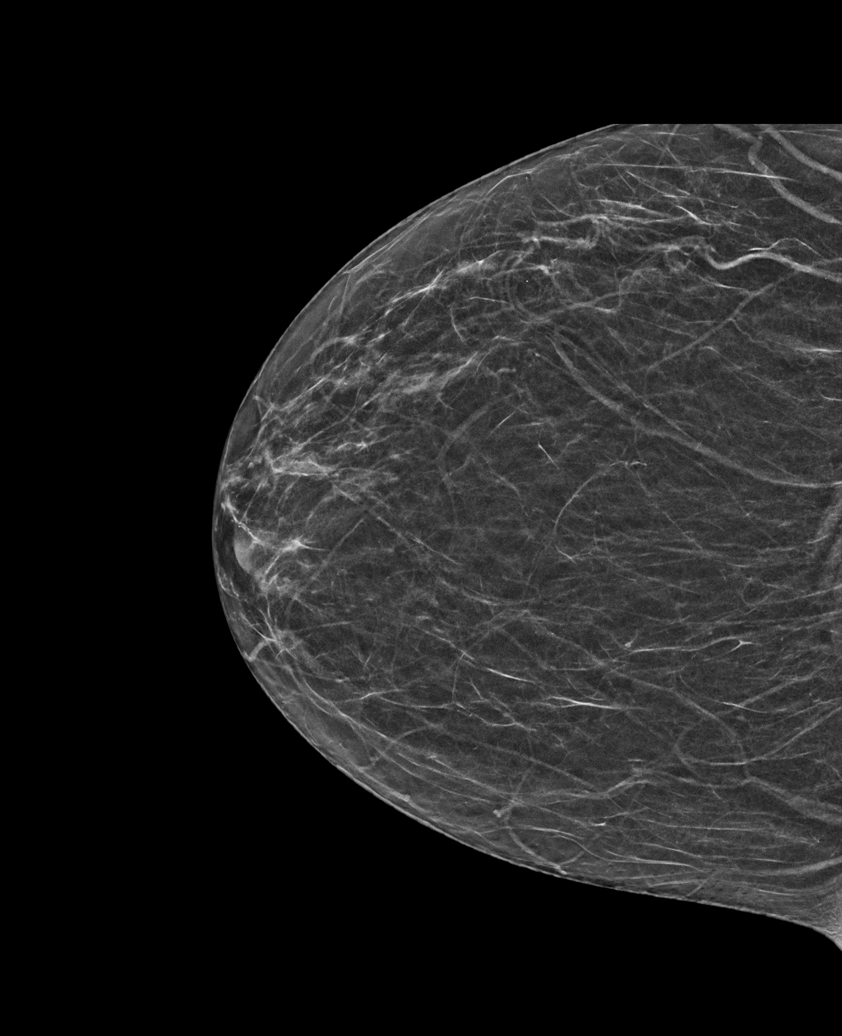

[L MLO synth-2D]
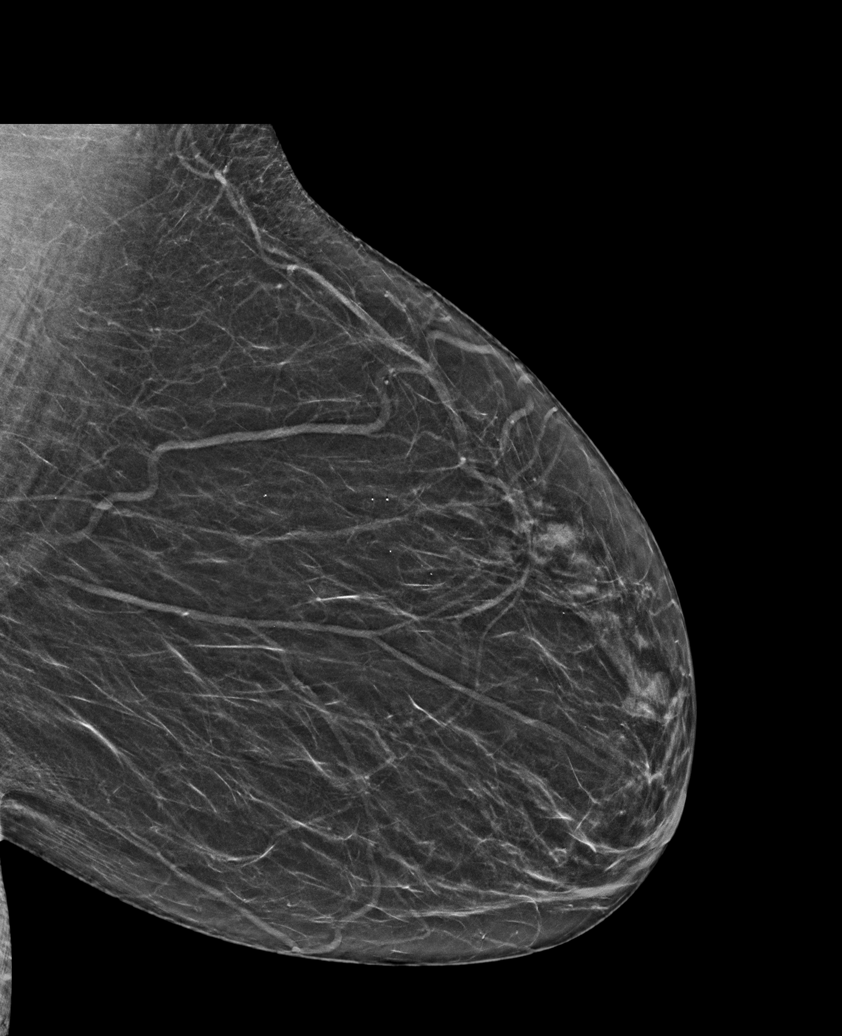

[L CC synth-2D]
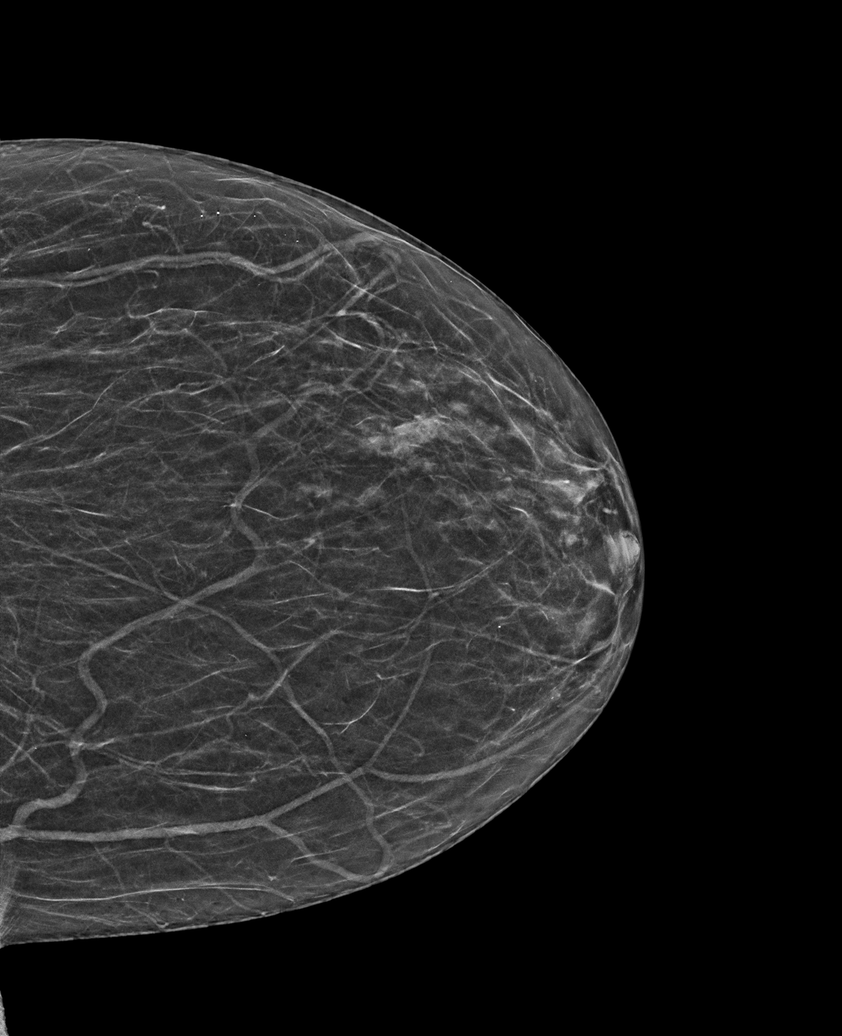

[6 of 26 positions shown; findings below may reference images not displayed]

ACR Breast Density Category b: There are scattered areas of
fibroglandular density.
FINDINGS: The previously demonstrated probably benign scattered calcifications
in the upper-outer left breast are fewer in number than seen on
12/23/2017 and 06/11/2017. The remaining calcifications continued
have punctate, rounded and oval configurations.

Mammographic images were processed with CAD.
IMPRESSION: Improved benign appearing left breast calcifications. These do not
need further follow-up.

RECOMMENDATION:
Bilateral screening mammogram in 1 year.

I have discussed the findings and recommendations with the patient.
Results were also provided in writing at the conclusion of the
visit. If applicable, a reminder letter will be sent to the patient
regarding the next appointment.

BI-RADS CATEGORY  2: Benign.

## 2019-03-03 ENCOUNTER — Other Ambulatory Visit: Payer: Self-pay

## 2019-03-03 ENCOUNTER — Encounter: Payer: Self-pay | Admitting: Internal Medicine

## 2019-03-03 ENCOUNTER — Ambulatory Visit: Payer: Self-pay | Attending: Internal Medicine | Admitting: Internal Medicine

## 2019-03-03 VITALS — BP 140/70 | HR 60 | Temp 98.7°F | Resp 18 | Ht <= 58 in | Wt 155.0 lb

## 2019-03-03 DIAGNOSIS — N898 Other specified noninflammatory disorders of vagina: Secondary | ICD-10-CM

## 2019-03-03 DIAGNOSIS — E119 Type 2 diabetes mellitus without complications: Secondary | ICD-10-CM

## 2019-03-03 DIAGNOSIS — E1169 Type 2 diabetes mellitus with other specified complication: Secondary | ICD-10-CM

## 2019-03-03 DIAGNOSIS — I10 Essential (primary) hypertension: Secondary | ICD-10-CM

## 2019-03-03 DIAGNOSIS — E785 Hyperlipidemia, unspecified: Secondary | ICD-10-CM

## 2019-03-03 DIAGNOSIS — Z23 Encounter for immunization: Secondary | ICD-10-CM

## 2019-03-03 LAB — POCT GLYCOSYLATED HEMOGLOBIN (HGB A1C): Hemoglobin A1C: 5.7 % — AB (ref 4.0–5.6)

## 2019-03-03 LAB — GLUCOSE, POCT (MANUAL RESULT ENTRY): POC Glucose: 96 mg/dl (ref 70–99)

## 2019-03-03 MED ORDER — HYDROCHLOROTHIAZIDE 25 MG PO TABS: 25.0000 mg | ORAL_TABLET | Freq: Every day | ORAL | 6 refills | Status: DC

## 2019-03-03 MED ORDER — ATORVASTATIN CALCIUM 40 MG PO TABS: 40.0000 mg | ORAL_TABLET | Freq: Every day | ORAL | 11 refills | Status: DC

## 2019-03-03 MED ORDER — NIFEDIPINE ER OSMOTIC RELEASE 30 MG PO TB24: 30.0000 mg | ORAL_TABLET | Freq: Every day | ORAL | 11 refills | Status: DC

## 2019-03-03 MED ORDER — GLIMEPIRIDE 2 MG PO TABS: ORAL_TABLET | ORAL | 11 refills | Status: DC

## 2019-03-03 NOTE — Patient Instructions (Signed)
Please give patient a follow-up appointment with the clinical pharmacist in 1 month for repeat blood pressure check.  You should avoid putting her finger in the vagina to clean the vagina.  If you notice any vaginal bleeding please follow-up.

## 2019-03-03 NOTE — Progress Notes (Signed)
Patient ID: [name], female    DOB: Sep 10, 1950  MRN: [id number]  CC: Follow-up   Subjective: [name] is a 68 y.o. female who presents for chronic disease management Her concerns today include:  Patient with history of DM, HTN, HL, OA..  Last seen 07/2018  2 mths ago while taking a shower, she put finger in vagina and noticed a little bit of discoloration.  Occurred on 2 different occasions.   Thinks it was caused by drinking too much hibiscus tea.  She has reduced consumption of hibiscus tea and has not had any further episodes. -She states that the discoloration as she saw was not blood. -no blood in urine or in panties.  Denies any vaginal discharge or itching.  She is currently not sexually active. -On further exploration of this matter with her, patient tells me that in her culture women normally clean out the vagina by using the finger whenever they take a bath and they tried to splash water into the vagina.  DIABETES TYPE 2 Last A1C:   Results for orders placed or performed in visit on 03/03/19  HgB A1c  Result Value Ref Range   Hemoglobin A1C 5.7 (A) 4.0 - 5.6 %   HbA1c POC (<> result, manual entry)     HbA1c, POC (prediabetic range)     HbA1c, POC (controlled diabetic range)    Glucose (CBG)  Result Value Ref Range   POC Glucose 96 70 - 99 mg/dl    Med Adherence:  _0  Yes    _1  No Medication side effects:  _2  Yes    _3  No Home Monitoring?  _4  Yes  Every morning Home glucose results range: 90-126 Diet Adherence: _5  Yes  - not too much starch Exercise: _6  Yes - uses a stepper 1-2 x a wk and does some stretches also Hypoglycemic episodes?: _7  Yes    _8  No Numbness of the feet? _9  Yes    _10  No Retinopathy hx? _11  Yes    _12  No Last eye exam:  Comments:   HYPERTENSION Currently taking: see medication list Med Adherence: _13  Yes    _14  No Medication side effects: _15  Yes    _16  No Adherence with salt restriction: _17  Yes    _18  No Home  Monitoring?: _19  Yes  -occasionally Monitoring Frequency: _20  Yes    _21  No Home BP results range: 130/80s SOB? _22  Yes    _23  No Chest Pain?: _24  Yes    _25  No Leg swelling?: _26  Yes    _27  No Headaches?: _28  Yes    _29  No Dizziness? _30  Yes    _31  No Comments:   HL:  Taking and tolerating Lipitor  Patient Active Problem List   Diagnosis Date Noted  . Adenomatous polyp of colon 07/29/2018  . Scarring of skin 07/16/2017  . Breast calcification, left 07/16/2017  . Immunization due 04/03/2017  . Hyperlipidemia associated with type 2 diabetes mellitus ([place]) 05/18/2016  . Primary osteoarthritis involving multiple joints 05/16/2016  . Diabetes type 2, controlled ([place]) 03/13/2015  . 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 Bilateral cataracts 03/13/2015  . Hypertension 10/06/2014     Current Outpatient Medications on File Prior to Visit  Medication Sig Dispense Refill  . Blood Glucose Monitoring Suppl (TRUE METRIX METER) W/DEVICE KIT 1 each by Does not apply route as needed. 1 kit 0  . Glucosamine-Chondroitin 750-600 MG TABS Take 2 tablets by mouth daily.  0  . ibuprofen (ADVIL,MOTRIN) 400 MG tablet  Take 400 mg by mouth every 6 (six) hours as needed.    . TRUEPLUS LANCETS 28G MISC 1 each by Does not apply route daily. 100 each 3  . Turmeric 500 MG CAPS Take 500 mg by mouth daily.    Marland Kitchen glucose blood (TRUE METRIX BLOOD GLUCOSE TEST) test strip 1 each by Other route daily. (Patient not taking: Reported on 07/01/2018) 100 each 3   Current Facility-Administered Medications on File Prior to Visit  Medication Dose Route Frequency Provider Last Rate Last Dose  . 0.9 %  sodium chloride infusion  500 mL Intravenous Once [place], MD        No Known Allergies  Social History   Socioeconomic History  . Marital status: Married    Spouse name: Not on file  . Number of children: 3  . Years of education: 46   . Highest education level: Not on file  Occupational History  . Not on file  Social  Needs  . Financial resource strain: Not on file  . Food insecurity    Worry: Not on file    Inability: Not on file  . Transportation needs    Medical: Yes    Non-medical: No  Tobacco Use  . Smoking status: Never Smoker  . Smokeless tobacco: Never Used  Substance and Sexual Activity  . Alcohol use: Yes    Comment: occassional  . Drug use: No  . Sexual activity: Not Currently    Birth control/protection: None  Lifestyle  . Physical activity    Days per week: 7 days    Minutes per session: 30 min  . Stress: Only a little  Relationships  . Social connections    Talks on phone: More than three times a week    Gets together: More than three times a week    Attends religious service: 1 to 4 times per year    Active member of club or organization: No    Attends meetings of clubs or organizations: Never    Relationship status: Married  . Intimate partner violence    Fear of current or ex partner: No    Emotionally abused: No    Physically abused: No    Forced sexual activity: No  Other Topics Concern  . Not on file  Social History Narrative   From [place]   Moved to [place] on 09/11/2014      Has 3 children- 1 daughter in [place],    1 child in [place]    Family History  Problem Relation Age of Onset  . Hypertension Mother   . Diabetes Mother   . Cancer Father        prostate  . Diabetes Sister   . Colon cancer Neg Hx   . Esophageal cancer Neg Hx   . Rectal cancer Neg Hx   . Stomach cancer Neg Hx     Past Surgical History:  Procedure Laterality Date  . CESAREAN SECTION  1990    ROS: Review of Systems Negative except as stated above  PHYSICAL EXAM: BP 140/70   Pulse 60   Temp 98.7 F (37.1 C) (Oral)   Resp 18   Ht _0  (1.422 m)   Wt 155 lb (70.3 kg)   SpO2 97%   BMI 34.75 kg/m   Physical Exam  General appearance - alert, well appearing, and in no distress Mental status - normal mood, behavior, speech, dress, motor activity, and thought processes Mouth -  mucous membranes moist,  pharynx normal without lesions Neck - supple, no significant adenopathy Chest - clear to auscultation, no wheezes, rales or rhonchi, symmetric air entry Heart - normal rate, regular rhythm, normal S1, S2, no murmurs, rubs, clicks or gallops Extremities - peripheral pulses normal, no pedal edema, no clubbing or cyanosis Diabetic Foot Exam - Simple   Simple Foot Form Visual Inspection No deformities, no ulcerations, no other skin breakdown bilaterally: Yes Sensation Testing Intact to touch and monofilament testing bilaterally: Yes Pulse Check Posterior Tibialis and Dorsalis pulse intact bilaterally: Yes Comments      CMP Latest Ref Rng & Units 05/06/2018 11/26/2017 12/29/2016  Glucose 65 - 99 mg/dL 89 95 69  BUN 8 - 27 mg/dL _0 Creatinine 0.57 - 1.00 mg/dL 0.70 0.78 0.90  Sodium 134 - 144 mmol/L 140 139 141  Potassium 3.5 - 5.2 mmol/L 4.0 4.2 4.4  Chloride 96 - 106 mmol/L 97 102 100  CO2 20 - 29 mmol/L _1 Calcium 8.7 - 10.3 mg/dL 10.5(H) 10.1 10.5(H)  Total Protein 6.0 - 8.5 g/dL - 7.4 7.8  Total Bilirubin 0.0 - 1.2 mg/dL - 0.5 0.7  Alkaline Phos 39 - 117 IU/L - 72 88  AST 0 - 40 IU/L - 23 26  ALT 0 - 32 IU/L - 14 16   Lipid Panel     Component Value Date/Time   CHOL 199 11/26/2017 0913   TRIG 74 11/26/2017 0913   HDL 67 11/26/2017 0913   CHOLHDL 3.0 11/26/2017 0913   CHOLHDL 3.3 05/16/2016 0928   VLDL 21 05/16/2016 0928   LDLCALC 117 (H) 11/26/2017 0913    CBC    Component Value Date/Time   WBC 4.6 11/26/2017 0913   WBC 5.2 05/16/2016 0944   RBC 4.31 11/26/2017 0913   RBC 4.72 05/16/2016 0944   HGB 12.9 11/26/2017 0913   HCT 36.9 11/26/2017 0913   PLT 283 11/26/2017 0913   MCV 86 11/26/2017 0913   MCH 29.9 11/26/2017 0913   MCH 29.7 05/16/2016 0944   MCHC 35.0 11/26/2017 0913   MCHC 34.2 05/16/2016 0944   RDW 14.1 11/26/2017 0913   LYMPHSABS 2.0 11/26/2017 0913   MONOABS 0.8 10/06/2014 1049   EOSABS 0.1 11/26/2017 0913    BASOSABS 0.1 11/26/2017 0913    ASSESSMENT AND PLAN:  1. Controlled type 2 diabetes mellitus without complication, without long-term current use of insulin (HCC) Continue Amaryl.  Continue healthy eating habits and regular exercise. - HgB A1c - Glucose (CBG) - CBC - Comprehensive metabolic panel - Lipid panel - Microalbumin / creatinine urine ratio - glimepiride (AMARYL) 2 MG tablet; TAKE 1 TABLET BY MOUTH DAILY BEFORE BREAKFAST.  Dispense: 30 tablet; Refill: 11  2. Essential hypertension Not at goal.  I recommended adding an ACE or ARB but patient declined stating she wanted to work on the blood pressure a little bit more.  She is agreeable to coming back in 1 month to see a clinical pharmacist for blood pressure recheck. - hydrochlorothiazide (HYDRODIURIL) 25 MG tablet; Take 1 tablet (25 mg total) by mouth daily.  Dispense: 30 tablet; Refill: 6 - NIFEdipine (PROCARDIA-XL/NIFEDICAL-XL) 30 MG 24 hr tablet; Take 1 tablet (30 mg total) by mouth daily.  Dispense: 30 tablet; Refill: 11  3. Hyperlipidemia associated with type 2 diabetes mellitus (HCC) - atorvastatin (LIPITOR) 40 MG tablet; Take 1 tablet (40 mg total) by mouth daily.  Dispense: 30 tablet; Refill: 11  4. Need for influenza vaccination Given  5. Vaginal irritation Advised against putting her finger in the vagina when she takes a bath to clean out the vagina.  Advised if she notices any vaginal bleeding or spotting she needs to follow-up with me  Patient was given the opportunity to ask questions.  Patient verbalized understanding of the plan and was able to repeat key elements of the plan.   Orders Placed This Encounter  Procedures  . CBC  . Comprehensive metabolic panel  . Lipid panel  . Microalbumin / creatinine urine ratio  . HgB A1c  . Glucose (CBG)   Addendum:  Pt with hypercalcemia on resulted labs.  Will check PTH, rPTH, TSH, vit D level.  Requested Prescriptions   Signed Prescriptions Disp Refills  .  atorvastatin (LIPITOR) 40 MG tablet 30 tablet 11    Sig: Take 1 tablet (40 mg total) by mouth daily.  . hydrochlorothiazide (HYDRODIURIL) 25 MG tablet 30 tablet 6    Sig: Take 1 tablet (25 mg total) by mouth daily.  Marland Kitchen glimepiride (AMARYL) 2 MG tablet 30 tablet 11    Sig: TAKE 1 TABLET BY MOUTH DAILY BEFORE BREAKFAST.  Marland Kitchen NIFEdipine (PROCARDIA-XL/NIFEDICAL-XL) 30 MG 24 hr tablet 30 tablet 11    Sig: Take 1 tablet (30 mg total) by mouth daily.    Return in about 4 months (around 07/04/2019).  Karle Plumber, MD, FACP

## 2019-03-04 LAB — LIPID PANEL
Chol/HDL Ratio: 2.9 ratio (ref 0.0–4.4)
Cholesterol, Total: 203 mg/dL — ABNORMAL HIGH (ref 100–199)
HDL: 71 mg/dL (ref >39)
LDL Chol Calc (NIH): 119 mg/dL — ABNORMAL HIGH (ref 0–99)
Triglycerides: 72 mg/dL (ref 0–149)
VLDL Cholesterol Cal: 13 mg/dL (ref 5–40)

## 2019-03-04 LAB — MICROALBUMIN / CREATININE URINE RATIO
Creatinine, Urine: 45.4 mg/dL
Microalb/Creat Ratio: <7 mg/g creat (ref 0–29)
Microalbumin, Urine: <3 ug/mL

## 2019-03-04 LAB — CBC
Hematocrit: 40 % (ref 34.0–46.6)
Hemoglobin: 13 g/dL (ref 11.1–15.9)
MCH: 29.4 pg (ref 26.6–33.0)
MCHC: 32.5 g/dL (ref 31.5–35.7)
MCV: 91 fL (ref 79–97)
Platelets: 266 10*3/uL (ref 150–450)
RBC: 4.42 x10E6/uL (ref 3.77–5.28)
RDW: 13.6 % (ref 11.7–15.4)
WBC: 6.5 10*3/uL (ref 3.4–10.8)

## 2019-03-04 LAB — COMPREHENSIVE METABOLIC PANEL
ALT: 16 IU/L (ref 0–32)
AST: 16 IU/L (ref 0–40)
Albumin/Globulin Ratio: 1.7 (ref 1.2–2.2)
Albumin: 4.8 g/dL (ref 3.8–4.8)
Alkaline Phosphatase: 69 IU/L (ref 39–117)
BUN/Creatinine Ratio: 12 (ref 12–28)
BUN: 12 mg/dL (ref 8–27)
Bilirubin Total: 0.5 mg/dL (ref 0.0–1.2)
CO2: 28 mmol/L (ref 20–29)
Calcium: 10.9 mg/dL — ABNORMAL HIGH (ref 8.7–10.3)
Chloride: 103 mmol/L (ref 96–106)
Creatinine, Ser: 1.02 mg/dL — ABNORMAL HIGH (ref 0.57–1.00)
GFR calc Af Amer: 66 mL/min/{1.73_m2} (ref >59)
GFR calc non Af Amer: 57 mL/min/{1.73_m2} — ABNORMAL LOW (ref >59)
Globulin, Total: 2.8 g/dL (ref 1.5–4.5)
Glucose: 92 mg/dL (ref 65–99)
Potassium: 4 mmol/L (ref 3.5–5.2)
Sodium: 142 mmol/L (ref 134–144)
Total Protein: 7.6 g/dL (ref 6.0–8.5)

## 2019-03-10 ENCOUNTER — Telehealth: Payer: Self-pay | Admitting: *Deleted

## 2019-03-10 NOTE — Telephone Encounter (Signed)
Patient verified DOB Patient is aware of lab results. She has been scheduled to return for lab work and BP check with CPP.

## 2019-03-10 NOTE — Telephone Encounter (Signed)
-----   Message from Ladell Pier, MD sent at 03/04/2019  7:56 AM EDT ----- Let patient know that her blood count is normal meaning no anemia.  Kidney function is decreased.  Make sure that she is drinking adequate water during the day. Avoid taking Ibuprofen, Aleve, Advil, Motrin as these can make kidney function worse.  Calcium level is elevated.  I will need for her to return to the lab to have additional blood tests to determine the cause of the increased calcium.  Hydrochlorothiazide can cause increased calcium but we need to rule out other possibilities. LDL cholesterol is 119 with goal being less than 70.  Is she taking the Atorvastatin daily as prescribed?  If so we need to increase the dose from 40 mg daily to 60 mg daily.  Please let me know so that I can send an up dated rxn to her pharmacy.

## 2019-03-14 ENCOUNTER — Telehealth: Payer: Self-pay | Admitting: Internal Medicine

## 2019-03-14 NOTE — Telephone Encounter (Signed)
Luke may you take care of this  

## 2019-03-14 NOTE — Telephone Encounter (Signed)
Call returned. Informed patient that she should try to avoid decongestants and NSAIDs given her high blood pressure and recent decrease in kidney function. Patient verbalized understanding and ha no further questions.

## 2019-03-14 NOTE — Telephone Encounter (Signed)
Pt called to request a list of OTC medications she cannot take, please advise when possible

## 2019-03-17 ENCOUNTER — Other Ambulatory Visit: Payer: No Typology Code available for payment source

## 2019-03-31 ENCOUNTER — Ambulatory Visit: Payer: No Typology Code available for payment source | Admitting: Pharmacist

## 2019-03-31 ENCOUNTER — Other Ambulatory Visit: Payer: No Typology Code available for payment source

## 2019-04-01 ENCOUNTER — Other Ambulatory Visit: Payer: No Typology Code available for payment source

## 2019-04-07 ENCOUNTER — Ambulatory Visit: Payer: No Typology Code available for payment source | Admitting: Pharmacist

## 2019-04-07 ENCOUNTER — Other Ambulatory Visit: Payer: No Typology Code available for payment source

## 2019-04-14 ENCOUNTER — Encounter: Payer: Self-pay | Admitting: Pharmacist

## 2019-04-14 ENCOUNTER — Ambulatory Visit: Payer: Self-pay | Attending: Internal Medicine | Admitting: Pharmacist

## 2019-04-14 ENCOUNTER — Other Ambulatory Visit: Payer: Self-pay

## 2019-04-14 ENCOUNTER — Ambulatory Visit: Payer: No Typology Code available for payment source

## 2019-04-14 VITALS — BP 142/81 | HR 62

## 2019-04-14 DIAGNOSIS — I10 Essential (primary) hypertension: Secondary | ICD-10-CM

## 2019-04-14 DIAGNOSIS — Z0131 Encounter for examination of blood pressure with abnormal findings: Secondary | ICD-10-CM

## 2019-04-14 NOTE — Patient Instructions (Signed)
Thank you for coming to see [place] today.   Blood pressure today is above goal.   Continue taking blood pressure medications as prescribed and return in 1 week. Make sure to take your medications before coming to that visit.   Limiting salt and caffeine, as well as exercising as able for at least 30 minutes for 5 days out of the week, can also help you lower your blood pressure.  Take your blood pressure at home if you are able. Please write down these numbers and bring them to your visits.  If you have any questions about medications, please call me [phone].  Lurena Joiner

## 2019-04-14 NOTE — Progress Notes (Signed)
   S:    Patient arrives in good spirits.    Presents to the clinic for hypertension evaluation, counseling, and management.  Patient was referred and last seen by Primary Care Provider on 03/03/2019.   Patient reports adherence with medications.  Patient denies chest pain, dyspnea, HA or vision changes. Endorses some swellin in her ankles but this is normal for her.   Current BP Medications include:  HCTZ 25 mg daily, Nifedipine 30 mg XL daily  Dietary habits include: pt denies consuming excess sodium; drinks tea but no sodas or coffee Exercise habits include: pt reports walking daily Family / Social history:  - DM (mother,sister), HTN (mother) - Never smoker - Drinks alcohol rarely  O:  Vitals:   04/14/19 0857  BP: (!) 142/81  Pulse: 62   Home BP readings: 130s/80s  Last 3 Office BP readings: BP Readings from Last 3 Encounters:  04/14/19 (!) 142/81  03/03/19 140/70  07/29/18 132/79   BMET    Component Value Date/Time   NA 142 03/03/2019 1644   K 4.0 03/03/2019 1644   CL 103 03/03/2019 1644   CO2 28 03/03/2019 1644   GLUCOSE 92 03/03/2019 1644   GLUCOSE 129 (H) 05/16/2016 0928   BUN 12 03/03/2019 1644   CREATININE 1.02 (H) 03/03/2019 1644   CREATININE 0.89 05/16/2016 0928   CALCIUM 10.9 (H) 03/03/2019 1644   GFRNONAA 57 (L) 03/03/2019 1644   GFRNONAA 69 05/16/2016 0928   GFRAA 66 03/03/2019 1644   GFRAA 79 05/16/2016 0928    Renal function: CrCl cannot be calculated (Patient's most recent lab result is older than the maximum 21 days allowed.).  Clinical ASCVD: No  The 10-year ASCVD risk score Mikey Bussing DC Jr., et al., 2013) is: 27.5%   Values used to calculate the score:     Age: 68 years     Sex: Female     Is Non-Hispanic African American: Yes     Diabetic: Yes     Tobacco smoker: No     Systolic Blood Pressure: [id number]     Is BP treated: Yes     HDL Cholesterol: 71 mg/dL     Total Cholesterol: 203 mg/dL  A/P: Hypertension longstanding currently  uncontrolled on current medications. BP Goal = <130/80 mmHg. Patient reports adherence to medications but has not taken these yet this morning. She had labs drawn to evaluate possible causes for recently noted hypercalcemia. She is on HCTZ but we are ruling out other causes first.   -Continued current regimen. -Encouraged pt to return in 1 week but take medications before seeing me.   -Counseled on lifestyle modifications for blood pressure control including reduced dietary sodium, increased exercise, adequate sleep  Results reviewed and written information provided.   Total time in face-to-face counseling 15 minutes.   F/U Clinic Visit in 1 week.    Benard Halsted, PharmD, [place] [phone]

## 2019-04-22 ENCOUNTER — Other Ambulatory Visit: Payer: Self-pay

## 2019-04-22 ENCOUNTER — Ambulatory Visit: Payer: Self-pay | Attending: Internal Medicine | Admitting: Pharmacist

## 2019-04-22 ENCOUNTER — Encounter: Payer: Self-pay | Admitting: Pharmacist

## 2019-04-22 VITALS — BP 115/75 | HR 67

## 2019-04-22 DIAGNOSIS — I10 Essential (primary) hypertension: Secondary | ICD-10-CM

## 2019-04-22 LAB — INTACT PTH (INCLUDES CALCIUM)
Calcium, Serum: 10.9 mg/dL — ABNORMAL HIGH
PTH (Intact Assay): 84 pg/mL — ABNORMAL HIGH

## 2019-04-22 LAB — TSH: TSH: 0.761 u[IU]/mL (ref 0.450–4.500)

## 2019-04-22 LAB — PTH-RELATED PEPTIDE: PTH-related peptide: <2 pmol/L

## 2019-04-22 LAB — VITAMIN D 25 HYDROXY (VIT D DEFICIENCY, FRACTURES): Vit D, 25-Hydroxy: 26.3 ng/mL — ABNORMAL LOW (ref 30.0–100.0)

## 2019-04-22 NOTE — Patient Instructions (Signed)

## 2019-04-22 NOTE — Progress Notes (Signed)
   S:    Patient arrives in good spirits.    Presents to the clinic for hypertension evaluation, counseling, and management.  Patient was referred and last seen by Primary Care Provider on 03/03/2019. I saw her 04/14/19 and her BP was above goal, however, she did not take her medications the morning of that appointment.   Patient reports adherence with medications. She has taken them this AM.   Patient denies chest pain, dyspnea, HA or vision changes.  Current BP Medications include:  HCTZ 25 mg daily, Nifedipine 30 mg XL daily   Dietary habits include: pt denies consuming excess sodium; drinks tea but no sodas or coffee Exercise habits include: pt reports walking daily Family / Social history:  - DM (mother,sister), HTN (mother) - Never smoker - Drinks alcohol rarely  O:  Vitals:   04/22/19 0926  BP: 115/75  Pulse: 67   Home BP readings: 120s-130s/70s - 80s Of note, pt reports taking multiple times a day.  Last 3 Office BP readings: BP Readings from Last 3 Encounters:  04/22/19 115/75  04/14/19 (!) 142/81  03/03/19 140/70   BMET    Component Value Date/Time   NA 142 03/03/2019 1644   K 4.0 03/03/2019 1644   CL 103 03/03/2019 1644   CO2 28 03/03/2019 1644   GLUCOSE 92 03/03/2019 1644   GLUCOSE 129 (H) 05/16/2016 0928   BUN 12 03/03/2019 1644   CREATININE 1.02 (H) 03/03/2019 1644   CREATININE 0.89 05/16/2016 0928   CALCIUM 10.9 (H) 03/03/2019 1644   GFRNONAA 57 (L) 03/03/2019 1644   GFRNONAA 69 05/16/2016 0928   GFRAA 66 03/03/2019 1644   GFRAA 79 05/16/2016 0928   Renal function: CrCl cannot be calculated (Patient's most recent lab result is older than the maximum 21 days allowed.).  Clinical ASCVD: No  The 10-year ASCVD risk score Mikey Bussing DC Jr., et al., 2013) is: 18.2%   Values used to calculate the score:     Age: 68 years     Sex: Female     Is Non-Hispanic African American: Yes     Diabetic: Yes     Tobacco smoker: No     Systolic Blood Pressure: [id number]  mmHg     Is BP treated: Yes     HDL Cholesterol: 71 mg/dL     Total Cholesterol: 203 mg/dL  A/P: Hypertension longstanding currently controlled on current medications. BP Goal = <130/80 mmHg. Patient reports adherence to medications.  -Continued current regimen. -Encouraged patient to check BP at home daily and rest before taking.  -Counseled on lifestyle modifications for blood pressure control including reduced dietary sodium, increased exercise, adequate sleep  Results reviewed and written information provided.   Total time in face-to-face counseling 15 minutes.   F/U Clinic Visit with PCP.   Benard Halsted, PharmD, [place] [phone]

## 2019-04-24 ENCOUNTER — Encounter: Payer: Self-pay | Admitting: Internal Medicine

## 2019-04-24 ENCOUNTER — Other Ambulatory Visit: Payer: Self-pay | Admitting: Internal Medicine

## 2019-04-24 DIAGNOSIS — E210 Primary hyperparathyroidism: Secondary | ICD-10-CM | POA: Insufficient documentation

## 2019-04-24 MED ORDER — VITAMIN D3 10 MCG (400 UNIT) PO TABS: 400.0000 [IU] | ORAL_TABLET | Freq: Every day | ORAL | 1 refills | Status: AC

## 2019-04-27 ENCOUNTER — Telehealth: Payer: Self-pay

## 2019-04-27 NOTE — Telephone Encounter (Signed)
Contacted pt to go over lab results pt didn't answer lvm asking pt to give me a call at her earliest convenience  

## 2019-04-27 NOTE — Telephone Encounter (Signed)
Pt returned call to go over lab results pt is aware and doesn't have any questions or concerns

## 2019-05-13 ENCOUNTER — Ambulatory Visit: Payer: Self-pay | Attending: Internal Medicine | Admitting: Internal Medicine

## 2019-05-13 ENCOUNTER — Other Ambulatory Visit: Payer: Self-pay

## 2019-05-13 DIAGNOSIS — E210 Primary hyperparathyroidism: Secondary | ICD-10-CM

## 2019-05-13 DIAGNOSIS — E785 Hyperlipidemia, unspecified: Secondary | ICD-10-CM

## 2019-05-13 DIAGNOSIS — E1169 Type 2 diabetes mellitus with other specified complication: Secondary | ICD-10-CM

## 2019-05-13 DIAGNOSIS — E559 Vitamin D deficiency, unspecified: Secondary | ICD-10-CM

## 2019-05-13 NOTE — Progress Notes (Signed)
Virtual Visit via Telephone Note Due to current restrictions/limitations of in-office visits due to the COVID-19 pandemic, this scheduled clinical appointment was converted to a telehealth visit  I connected with Cassandra Aretta Harrell on 05/13/19 at 9:42 a.m by telephone and verified that I am speaking with the correct person using two identifiers. I am in my office.  The patient is at home.  Only the patient and myself participated in this encounter.  I discussed the limitations, risks, security and privacy concerns of performing an evaluation and management service by telephone and the availability of in person appointments. I also discussed with the patient that there may be a patient responsible charge related to this service. The patient expressed understanding and agreed to proceed.   History of Present Illness: Patient with history of DM, HTN, HL, OA..   Patient last seen 03/2019.  Purpose of today's visit is to discuss diagnosis of primary hyperparathyroidism.   Purpose of today's visit was to discuss new diagnosis of primary hyperparathyroidism.  Patient noted to have hypercalcemia on chemistry.  Parathyroid hormone level was elevated at 84.  Thyroid level was normal.  Parathyroid hormone related peptide was normal.  Vitamin D level was slightly low at 26.  Patient has not had any history of kidney stones, easy fractures.  Her GFR had decreased to 66.  Previously it was 104.  Bone density study has been ordered.  It is scheduled for February of next year.  Patient delayed having it done until done to allow herself time to renew her orange card. Her LDL cholesterol was not at goal.  It was 119 with goal being less than 70.  Patient reports that she was not taking the Lipitor consistently but has started taking it consistently since being informed of lab results by my medical assistant.   Outpatient Encounter Medications as of 05/13/2019  Medication Sig  . atorvastatin (LIPITOR) 40 MG  tablet Take 1 tablet (40 mg total) by mouth daily.  . Blood Glucose Monitoring Suppl (TRUE METRIX METER) W/DEVICE KIT 1 each by Does not apply route as needed.  . Cholecalciferol (VITAMIN D3) 10 MCG (400 UNIT) tablet Take 1 tablet (400 Units total) by mouth daily.  Marland Kitchen glimepiride (AMARYL) 2 MG tablet TAKE 1 TABLET BY MOUTH DAILY BEFORE BREAKFAST.  Marland Kitchen Glucosamine-Chondroitin 750-600 MG TABS Take 2 tablets by mouth daily.  Marland Kitchen glucose blood (TRUE METRIX BLOOD GLUCOSE TEST) test strip 1 each by Other route daily. (Patient not taking: Reported on 07/01/2018)  . hydrochlorothiazide (HYDRODIURIL) 25 MG tablet Take 1 tablet (25 mg total) by mouth daily.  Marland Kitchen ibuprofen (ADVIL,MOTRIN) 400 MG tablet Take 400 mg by mouth every 6 (six) hours as needed.  Marland Kitchen NIFEdipine (PROCARDIA-XL/NIFEDICAL-XL) 30 MG 24 hr tablet Take 1 tablet (30 mg total) by mouth daily.  . TRUEPLUS LANCETS 28G MISC 1 each by Does not apply route daily.  . Turmeric 500 MG CAPS Take 500 mg by mouth daily.   Facility-Administered Encounter Medications as of 05/13/2019  Medication  . 0.9 %  sodium chloride infusion    Observations/Objective:  Results for orders placed or performed in visit on 04/14/19  Intact PTH (Includes Calcium)  Result Value Ref Range   Calcium, Serum 10.9 (H) mg/dL   PTH (Intact Assay) 84 (H) pg/mL  PTH-Related Peptide  Result Value Ref Range   PTH-related peptide <2.0 pmol/L  VITAMIN D 25 Hydroxy (Vit-D Deficiency, Fractures)  Result Value Ref Range   Vit D, 25-Hydroxy 26.3 (L) 30.0 -  100.0 ng/mL  TSH  Result Value Ref Range   TSH 0.761 0.450 - 4.500 uIU/mL    Assessment and Plan: 1. Primary hyperparathyroidism ([place]) -Discussed diagnosis with the patient including cause, manifestations and management.  Based on current information she does not meet criteria for needing surgery.  This would include serum calcium level greater than 1 mg/DL above the upper limit of normal, renal insufficiency with GFR less than  60, history of kidney stone or osteoporosis.  We do not know if she has osteoporosis as we do not have the bone density study as yet. -Advised patient that for now we will monitor unless bone density study comes back abnormal.  2. Hyperlipidemia associated with type 2 diabetes mellitus ([place]) LDL not at goal.  Encourage her to take the atorvastatin 40 mg daily as prescribed.  Healthy eating habits encouraged.  3. Vitamin D deficiency I had sent prescription to the pharmacy for vitamin D 400 IU daily.   Follow Up Instructions: End of Feb or early March 2021   I discussed the assessment and treatment plan with the patient. The patient was provided an opportunity to ask questions and all were answered. The patient agreed with the plan and demonstrated an understanding of the instructions.   The patient was advised to call back or seek an in-person evaluation if the symptoms worsen or if the condition fails to improve as anticipated.  I provided 12 minutes of non-face-to-face time during this encounter.   Karle Plumber, MD

## 2019-05-31 ENCOUNTER — Other Ambulatory Visit: Payer: Self-pay | Admitting: Obstetrics and Gynecology

## 2019-06-08 ENCOUNTER — Other Ambulatory Visit (HOSPITAL_COMMUNITY): Payer: Self-pay | Admitting: *Deleted

## 2019-06-08 DIAGNOSIS — Z1231 Encounter for screening mammogram for malignant neoplasm of breast: Secondary | ICD-10-CM

## 2019-07-07 ENCOUNTER — Encounter (HOSPITAL_COMMUNITY): Payer: Self-pay

## 2019-07-07 ENCOUNTER — Ambulatory Visit: Payer: Self-pay

## 2019-07-07 ENCOUNTER — Ambulatory Visit
Admission: RE | Admit: 2019-07-07 | Discharge: 2019-07-07 | Disposition: A | Discharge status: Home / Self Care | Payer: No Typology Code available for payment source | Source: Ambulatory Visit | Attending: Obstetrics and Gynecology | Admitting: Obstetrics and Gynecology

## 2019-07-07 ENCOUNTER — Other Ambulatory Visit: Payer: Self-pay

## 2019-07-07 ENCOUNTER — Ambulatory Visit (HOSPITAL_COMMUNITY)
Admission: RE | Admit: 2019-07-07 | Discharge: 2019-07-07 | Disposition: A | Discharge status: Home / Self Care | Payer: Self-pay | Source: Ambulatory Visit | Attending: Obstetrics and Gynecology | Admitting: Obstetrics and Gynecology

## 2019-07-07 DIAGNOSIS — Z1231 Encounter for screening mammogram for malignant neoplasm of breast: Secondary | ICD-10-CM

## 2019-07-07 DIAGNOSIS — Z1239 Encounter for other screening for malignant neoplasm of breast: Secondary | ICD-10-CM

## 2019-07-07 NOTE — Patient Instructions (Signed)
Explained breast self awareness with [name].  Patient did not need a Pap smear today due to last Pap smear and HPV typing was 11/29/2014. Let her know [facility] will cover Pap smears and HPV typing every 5 years unless has a history of abnormal Pap smears. Let patient know that her next Pap smear is due July 2021 and she can schedule with [facility]. Referred patient to the [facility] for a screening mammogram. Appointment scheduled for Thursday, July 07, 2019 at 1030. Patient aware of appointment and will be there. Let patient know the Breast will follow up with her within the next couple weeks with results of her mammogram by letter or phone. [name] verbalized understanding.  Cuthbert Turton, Arvil Chaco, RN 10:00 AM

## 2019-07-07 NOTE — Progress Notes (Signed)
No complaints today.   Pap Smear: Pap smear not completed today. Last Pap smear was 11/29/2014 at [facility] and normal with negative HPV. Per patient has no history of an abnormal Pap smear. Last Pap smear result is in Epic.  Physical exam: Breasts Left breast slightly larger than right breast that per patient has not noticed any changes. No skin abnormalities bilateral breasts. No nipple retraction bilateral breasts. No nipple discharge bilateral breasts. No lymphadenopathy. No lumps palpated bilateral breasts. No complaints of pain or tenderness on exam. Referred patient to the [facility] for a screening mammogram. Appointment scheduled for Thursday, July 07, 2019 at 1030.      Pelvic/Bimanual No Pap smear completed today since last Pap smear and HPV typing was 11/29/2014. Pap smear not indicated per BCCCP guidelines.  Smoking History: Patient has never smoked.  Patient Navigation: Patient education provided. Access to services provided for patient through [facility] program. Uber/Lyft transportation provided for patient to get to appointment at the [facility] and home.  Colorectal Cancer Screening: Patient had a colonoscopy completed 05/20/2018. FIT Test completed 05/29/2017 that was negative. No complaints today.   Breast and Cervical Cancer Risk Assessment: Patient has no family history of breast cancer, known genetic mutations, or radiation treatment to the chest before age 36. Patient has no history of cervical dysplasia, immunocompromised, or DES exposure in-utero.  Risk Assessment    Risk Scores      07/07/2019 07/01/2018   Last edited by: Demetrius Revel, LPN Rolena Infante H, LPN   5-year risk: 1.7 % 1.7 %   Lifetime risk: 5.5 % 5.7 %

## 2019-07-29 ENCOUNTER — Other Ambulatory Visit: Payer: Self-pay

## 2019-08-12 ENCOUNTER — Other Ambulatory Visit: Payer: Self-pay

## 2019-08-16 ENCOUNTER — Ambulatory Visit: Payer: Self-pay | Admitting: Internal Medicine

## 2019-08-19 ENCOUNTER — Ambulatory Visit: Payer: No Typology Code available for payment source | Admitting: Internal Medicine

## 2019-08-26 ENCOUNTER — Ambulatory Visit: Payer: No Typology Code available for payment source | Admitting: Family

## 2019-09-08 NOTE — Progress Notes (Signed)
Patient ID: [name], female    DOB: 11/26/1950  MRN: [id number]  CC: Hyperparathyroidism Follow-Up  Subjective: [name] is a 69 y.o. female with history of hypertension, adenomatous polyp of colon type 2 diabetes, hyperlipidemia associated with type 2 diabetes mellitus, primary hyperparathyroidism, primary osteoarthritis involving multiple joints, scarring of skin, bilateral cataracts, ptosis of both eyelids, and left breast calcification.    Her concerns today include: hyperparathyroidism follow-up  1. PRIMARY HYPERPARATHYROIDISM FOLLOW-UP:  Chest pain: denies Shortness of breath: denies  Palpitations: denies Syncope: denies Anorexia: denies Constipation: denies Epigastric Pain: denies Nausea: denies Vomiting: denies Increased thirst: denies  Increased urination: denies Renal colic: denies Anxiety: denies Confusion: denies Fatigue: denies Weakness: denies Bone pain: denies Joint pain: denies Itching: denies Comments: Last visit with Dr. [name] in December 2020. During that encounter discussed diagnosis with patient including cause, manifestations and management. At that time patient did not meet criteria for needing surgery. This would include calcium level greater than 1 mg/DL above the upper limit of normal, renal insufficiency with GFR less than 60, history of kidney stone or osteoporosis. During that time it was unknown if patient had osteoporosis as a bone density study was not complete. Bone density study was ordered and scheduled for February 2021. Patient delayed having it done to allow herself time to renew her orange card. Recommendation to monitor patient unless bone density study results abnormal.  Today states has not received orange card yet. Reports has an appointment next week with financial counselor for orange card.    2. HYPERLIPIDEMIA FOLLOW-UP:  HYPERLIPIDEMIA  Last Lipid Panel results:  HDL  Date Value Ref Range Status    03/03/2019 71 >39 mg/dL Final   Triglycerides  Date Value Ref Range Status  03/03/2019 72 0 - 149 mg/dL Final    Med Adherence: '[x]'$  Yes    '[]'$  No Medication side effects: '[]'$  Yes    '[x]'$  No Muscle aches:  '[]'$  Yes    '[x]'$  No Diet Adherence: '[x]'$  Yes    '[]'$  No Comments: Last visit with Dr. [name] in December 2020. During that encounter LDL was not at goal. Patient was encouraged to take Atorvastatin 40 mg/daily as prescribed. Healthy eating habits encouraged.   3. VITAMIN D DEFICIENCY FOLLOW-UP:  States she was told to take 2000 units of vitamin D daily. Reports she purchased vitamin D over-the-counter in 1000 unit capsules and has been taking 2 capsules daily since last week. Last visit with Dr. [name] in December 2020. During that encounter a prescription was sent to pharmacy for vitamin D 400 IU daily. Reports she never picked up the prescription for the 400 units.   Patient Active Problem List   Diagnosis Date Noted  . Screening breast examination 07/07/2019  . Primary hyperparathyroidism ([place]) 04/24/2019  . Adenomatous polyp of colon 07/29/2018  . Scarring of skin 07/16/2017  . Breast calcification, left 07/16/2017  . Immunization due 04/03/2017  . Hyperlipidemia associated with type 2 diabetes mellitus ([place]) 05/18/2016  . Primary osteoarthritis involving multiple joints 05/16/2016  . Diabetes type 2, controlled ([place]) 03/13/2015  . 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 Bilateral cataracts 03/13/2015  . Hypertension 10/06/2014     Current Outpatient Medications on File Prior to Visit  Medication Sig Dispense Refill  . atorvastatin (LIPITOR) 40 MG tablet Take 1 tablet (40 mg total) by mouth daily. 30 tablet 11  . Blood Glucose Monitoring Suppl (TRUE METRIX METER) W/DEVICE KIT 1 each by Does not apply route as  needed. 1 kit 0  . Cholecalciferol (VITAMIN D3) 10 MCG (400 UNIT) tablet Take 1 tablet (400 Units total) by mouth daily. 100 tablet 1  . glimepiride (AMARYL) 2 MG tablet TAKE 1  TABLET BY MOUTH DAILY BEFORE BREAKFAST. 30 tablet 11  . Glucosamine-Chondroitin 750-600 MG TABS Take 2 tablets by mouth daily.  0  . glucose blood (TRUE METRIX BLOOD GLUCOSE TEST) test strip 1 each by Other route daily. (Patient not taking: Reported on 07/01/2018) 100 each 3  . hydrochlorothiazide (HYDRODIURIL) 25 MG tablet Take 1 tablet (25 mg total) by mouth daily. 30 tablet 6  . ibuprofen (ADVIL,MOTRIN) 400 MG tablet Take 400 mg by mouth every 6 (six) hours as needed.    Marland Kitchen NIFEdipine (PROCARDIA-XL/NIFEDICAL-XL) 30 MG 24 hr tablet Take 1 tablet (30 mg total) by mouth daily. 30 tablet 11  . TRUEPLUS LANCETS 28G MISC 1 each by Does not apply route daily. 100 each 3  . Turmeric 500 MG CAPS Take 500 mg by mouth daily.     Current Facility-Administered Medications on File Prior to Visit  Medication Dose Route Frequency Provider Last Rate Last Admin  . 0.9 %  sodium chloride infusion  500 mL Intravenous Once [place], MD        No Known Allergies  Social History   Socioeconomic History  . Marital status: Married    Spouse name: Not on file  . Number of children: 3  . Years of education: 43   . Highest education level: High school graduate  Occupational History  . Not on file  Tobacco Use  . Smoking status: Never Smoker  . Smokeless tobacco: Never Used  Substance and Sexual Activity  . Alcohol use: Not Currently    Comment: occassional  . Drug use: No  . Sexual activity: Not Currently    Birth control/protection: None  Other Topics Concern  . Not on file  Social History Narrative   From [place]   Moved to [place] on 09/11/2014      Has 3 children- 1 daughter in [place],    1 child in [place]   Social Determinants of Health   Financial Resource Strain:   . Difficulty of Paying Living Expenses:   Food Insecurity:   . Worried About Charity fundraiser in the Last Year:   . Arboriculturist in the Last Year:   Transportation Needs: Unmet Transportation Needs  . Lack of  Transportation (Medical): Yes  . Lack of Transportation (Non-Medical): Yes  Physical Activity:   . Days of Exercise per Week:   . Minutes of Exercise per Session:   Stress:   . Feeling of Stress :   Social Connections:   . Frequency of Communication with Friends and Family:   . Frequency of Social Gatherings with Friends and Family:   . Attends Religious Services:   . Active Member of Clubs or Organizations:   . Attends Archivist Meetings:   Marland Kitchen Marital Status:   Intimate Partner Violence:   . Fear of Current or Ex-Partner:   . Emotionally Abused:   Marland Kitchen Physically Abused:   . Sexually Abused:     Family History  Problem Relation Age of Onset  . Hypertension Mother   . Diabetes Mother   . Cancer Father        prostate  . Diabetes Sister   . Colon cancer Neg Hx   . Esophageal cancer Neg Hx   . Rectal cancer Neg Hx   .  Stomach cancer Neg Hx     Past Surgical History:  Procedure Laterality Date  . CESAREAN SECTION  1990    ROS: Review of Systems Negative except as stated above  PHYSICAL EXAM: Vitals with BMI 09/09/2019 07/07/2019 04/22/2019  Height '4\' 8"'$  - -  Weight 153 lbs 10 oz - -  BMI 51.70 - -  Systolic 017 494 496  Diastolic 84 86 75  Pulse 71 - 67  SpO2-95%,room air Temperature- 97.9 F  Physical Exam General appearance - alert, well appearing, and in no distress and oriented to person, place, and time Mental status - alert, oriented to person, place, and time, normal mood, behavior, speech, dress, motor activity, and thought processes Eyes - pupils equal and reactive, extraocular eye movements intact Neck - supple, no significant adenopathy Lymphatics - no palpable lymphadenopathy, no hepatosplenomegaly Chest - clear to auscultation, no wheezes, rales or rhonchi, symmetric air entry, no tachypnea, retractions or cyanosis Heart - normal rate, regular rhythm, normal S1, S2, no murmurs, rubs, clicks or gallops Neurological - alert, oriented, normal  speech, no focal findings or movement disorder noted, neck supple without rigidity, cranial nerves II through XII intact, funduscopic exam normal, discs flat and sharp, DTR's normal and symmetric, motor and sensory grossly normal bilaterally, normal muscle tone, no tremors, strength 5/5, Romberg sign negative, normal gait and station Musculoskeletal - no joint tenderness, deformity or swelling  ASSESSMENT AND PLAN: 1. Primary hyperparathyroidism ([place]) -The following labs calcium, vitamin D, CMP, PTH, and TSH will be collected today in order to assess the current status of patient's primary hyperparathyroidism since last visit 4 months ago. -Patient has not been approved for orange card/[place] financial assistance yet. Encourage patient to keep appointment with financial counselor scheduled for next week. -Completion of bone density study pending approval of orange card/[place] financial assistance. -Follow-up with primary doctor in 4 to 6 weeks. - Calcium - Vitamin D, 25-hydroxy - CMP14+EGFR - PTH-Related Peptide - Intact PTH (Includes Calcium) - TSH - DG Bone Density; Future  2. Hyperlipidemia associated with type 2 diabetes mellitus ([place]): -Continue atorvastatin 40 mg daily by mouth.  Currently under refills available at patient's pharmacy pharmacy.  3. Vitamin D deficiency: -Counseled patient the importance of taking the correct dose of vitamin D. Explained to patient that she has a prescription on file for vitamin D ordered by her primary doctor.  Patient agreeable. -Patient should take vitamin D 1 tablet (400 units total) by mouth daily. - Vitamin D, 25-hydroxy  Patient was given the opportunity to ask questions.  Patient verbalized understanding of the plan and was able to repeat key elements of the plan. Patient was given clear instructions to go to Emergency Department or return to medical center if symptoms don't improve, worsen, or new problems develop.The patient  verbalized understanding.   Requested Prescriptions    No prescriptions requested or ordered in this encounter   Beila Purdie Zachery Dauer, NP

## 2019-09-09 ENCOUNTER — Encounter: Payer: Self-pay | Admitting: Family

## 2019-09-09 ENCOUNTER — Other Ambulatory Visit: Payer: Self-pay

## 2019-09-09 ENCOUNTER — Ambulatory Visit: Payer: Self-pay | Attending: Internal Medicine | Admitting: Family

## 2019-09-09 VITALS — BP 137/84 | HR 71 | Temp 97.9°F | Ht <= 58 in | Wt 153.6 lb

## 2019-09-09 DIAGNOSIS — E210 Primary hyperparathyroidism: Secondary | ICD-10-CM

## 2019-09-09 DIAGNOSIS — E785 Hyperlipidemia, unspecified: Secondary | ICD-10-CM

## 2019-09-09 DIAGNOSIS — E1169 Type 2 diabetes mellitus with other specified complication: Secondary | ICD-10-CM

## 2019-09-09 DIAGNOSIS — E559 Vitamin D deficiency, unspecified: Secondary | ICD-10-CM

## 2019-09-09 NOTE — Patient Instructions (Signed)
Labs collected today. Apply for orange card. Bone density scan still needed. Follow-up with primary doctor in 4-6 weeks.  Hypercalcemia Hypercalcemia is when the level of calcium in a person's blood is above normal. The body needs calcium to make bones and keep them strong. Calcium also helps the muscles, nerves, brain, and heart work the way they should. Most of the calcium in the body is in the bones. There is also some calcium in the blood. Hypercalcemia can happen when calcium comes out of the bones, or when the kidneys are not able to remove calcium from the blood. Hypercalcemia can be mild or severe. What are the causes? There are many possible causes of hypercalcemia. Common causes of this condition include:  Hyperparathyroidism. This is a condition in which the body produces too much parathyroid hormone. There are four parathyroid glands in your neck. These glands produce a chemical messenger (hormone) that helps the body absorb calcium from foods and helps your bones release calcium.  Certain kinds of cancer. Less common causes of hypercalcemia include:  Getting too much calcium or vitamin D from your diet.  Kidney failure.  Hyperthyroidism.  Severe dehydration.  Being on bed rest or being inactive for a long time.  Certain medicines.  Infections. What increases the risk? You are more likely to develop this condition if you:  Are female.  Are 92 years of age or older.  Have a family history of hypercalcemia. What are the signs or symptoms? Mild hypercalcemia that starts slowly may not cause symptoms. Severe, sudden hypercalcemia is more likely to cause symptoms, such as:  Being more thirsty than usual.  Needing to urinate more often than usual.  Abdominal pain.  Nausea and vomiting.  Constipation.  Muscle pain, twitching, or weakness.  Feeling very tired. How is this diagnosed?  Hypercalcemia is usually diagnosed with a blood test. You may also have tests  to help determine what is causing this condition, such as imaging tests and more blood tests. How is this treated? Treatment for hypercalcemia depends on the cause. Treatment may include:  Receiving fluids through an IV.  Medicines that: ? Keep calcium levels steady after receiving fluids (loop diuretics). ? Keep calcium in your bones (bisphosphonates). ? Lower the calcium level in your blood.  Surgery to remove overactive parathyroid glands.  A procedure that filters your blood to correct calcium levels (hemodialysis). Follow these instructions at home:   Take over-the-counter and prescription medicines only as told by your health care provider.  Follow instructions from your health care provider about eating or drinking restrictions.  Drink enough fluid to keep your urine pale yellow.  Stay active. Weight-bearing exercise helps to keep calcium in your bones. Follow instructions from your health care provider about what type and level of exercise is safe for you.  Keep all follow-up visits as told by your health care provider. This is important. Contact a health care provider if you have:  A fever.  A heartbeat that is irregular or very fast.  Changes in mood, memory, or personality. Get help right away if you:  Have severe abdominal pain.  Have chest pain.  Have trouble breathing.  Become very confused and sleepy.  Lose consciousness. Summary  Hypercalcemia is when the level of calcium in a person's blood is above normal. The body needs calcium to make bones and keep them strong. Calcium also helps the muscles, nerves, brain, and heart work the way they should.  There are many possible causes of  hypercalcemia, and treatment depends on the cause.  Take over-the-counter and prescription medicines only as told by your health care provider.  Follow instructions from your health care provider about eating or drinking restrictions. This information is not intended to  replace advice given to you by your health care provider. Make sure you discuss any questions you have with your health care provider. Document Revised: 06/15/2018 Document Reviewed: 02/22/2018 Elsevier Patient Education  2020 [employer].

## 2019-09-09 NOTE — Progress Notes (Signed)
fu

## 2019-09-10 NOTE — Progress Notes (Signed)
Vitamin D level normal. Continue taking vitamin D as prescribed.  Elevated calcium which is the same since last visit. Will still need bone density study pending approval of orange card.  Blood sugar elevated as patient was not fasting.  Remaining labs pending results.

## 2019-09-14 NOTE — Progress Notes (Signed)
Serum calcium (which monitors bone disease) elevated which is the same since last visit.  PTH (which monitors thyroid function) elevated but improved some since last visit.

## 2019-09-14 NOTE — Progress Notes (Signed)
TSH normal. This also checks for thyroid function.

## 2019-09-16 ENCOUNTER — Ambulatory Visit: Payer: No Typology Code available for payment source

## 2019-09-18 LAB — CMP14+EGFR
ALT: 21 IU/L (ref 0–32)
AST: 25 IU/L (ref 0–40)
Albumin/Globulin Ratio: 1.6 (ref 1.2–2.2)
Albumin: 4.5 g/dL (ref 3.8–4.8)
Alkaline Phosphatase: 65 IU/L (ref 39–117)
BUN/Creatinine Ratio: 21 (ref 12–28)
BUN: 17 mg/dL (ref 8–27)
Bilirubin Total: 0.7 mg/dL (ref 0.0–1.2)
CO2: 24 mmol/L (ref 20–29)
Calcium: 10.9 mg/dL — ABNORMAL HIGH (ref 8.7–10.3)
Chloride: 101 mmol/L (ref 96–106)
Creatinine, Ser: 0.82 mg/dL (ref 0.57–1.00)
GFR calc Af Amer: 86 mL/min/{1.73_m2} (ref >59)
GFR calc non Af Amer: 74 mL/min/{1.73_m2} (ref >59)
Globulin, Total: 2.9 g/dL (ref 1.5–4.5)
Glucose: 117 mg/dL — ABNORMAL HIGH (ref 65–99)
Potassium: 4.2 mmol/L (ref 3.5–5.2)
Sodium: 139 mmol/L (ref 134–144)
Total Protein: 7.4 g/dL (ref 6.0–8.5)

## 2019-09-18 LAB — PTH-RELATED PEPTIDE: PTH-related peptide: <2 pmol/L

## 2019-09-18 LAB — INTACT PTH (INCLUDES CALCIUM)
Calcium, Serum: 11 mg/dL — ABNORMAL HIGH
PTH (Intact Assay): 78 pg/mL — ABNORMAL HIGH

## 2019-09-18 LAB — TSH: TSH: 0.568 u[IU]/mL (ref 0.450–4.500)

## 2019-09-18 LAB — VITAMIN D 25 HYDROXY (VIT D DEFICIENCY, FRACTURES): Vit D, 25-Hydroxy: 40.7 ng/mL (ref 30.0–100.0)

## 2019-09-19 ENCOUNTER — Other Ambulatory Visit: Payer: Self-pay

## 2019-09-19 ENCOUNTER — Ambulatory Visit: Payer: Self-pay

## 2019-09-20 ENCOUNTER — Telehealth: Payer: Self-pay | Admitting: Internal Medicine

## 2019-09-20 NOTE — Telephone Encounter (Signed)
Pt was inform that still did not got any email or documents that she is missing to complete fer CAFA application

## 2019-09-20 NOTE — Telephone Encounter (Signed)
Patient called in and requested for financial adviser to return her call regarding the paperwork she emailed over. Please follow up at your earliest convenience.

## 2019-09-26 ENCOUNTER — Telehealth: Payer: Self-pay | Admitting: Internal Medicine

## 2019-09-26 NOTE — Telephone Encounter (Signed)
Error, taken care of at the time the call was placed.

## 2019-10-14 ENCOUNTER — Other Ambulatory Visit: Payer: Self-pay

## 2019-10-21 ENCOUNTER — Ambulatory Visit
Admission: RE | Admit: 2019-10-21 | Discharge: 2019-10-21 | Disposition: A | Discharge status: Home / Self Care | Payer: No Typology Code available for payment source | Source: Ambulatory Visit | Attending: Internal Medicine | Admitting: Internal Medicine

## 2019-10-21 ENCOUNTER — Other Ambulatory Visit: Payer: Self-pay

## 2019-10-21 DIAGNOSIS — E210 Primary hyperparathyroidism: Secondary | ICD-10-CM

## 2019-10-22 NOTE — Progress Notes (Signed)
Let patient know that bone density study revealed some thinning of the bones at the hip but not at the level to be considered osteoporosis.  Regular exercise like walking several times a week will help to keep the bones strong.  Her vitamin D level is good so she does not need vitamin D supplement.

## 2019-12-09 ENCOUNTER — Other Ambulatory Visit: Payer: Self-pay

## 2019-12-09 ENCOUNTER — Ambulatory Visit: Payer: No Typology Code available for payment source | Admitting: Internal Medicine

## 2019-12-09 ENCOUNTER — Other Ambulatory Visit: Payer: Self-pay | Admitting: Internal Medicine

## 2019-12-09 ENCOUNTER — Encounter: Payer: Self-pay | Admitting: Internal Medicine

## 2019-12-09 ENCOUNTER — Ambulatory Visit: Payer: Self-pay | Attending: Internal Medicine | Admitting: Internal Medicine

## 2019-12-09 VITALS — BP 129/80 | HR 69 | Temp 97.7°F | Resp 16 | Wt 159.6 lb

## 2019-12-09 DIAGNOSIS — E210 Primary hyperparathyroidism: Secondary | ICD-10-CM

## 2019-12-09 DIAGNOSIS — M85851 Other specified disorders of bone density and structure, right thigh: Secondary | ICD-10-CM

## 2019-12-09 DIAGNOSIS — Z8669 Personal history of other diseases of the nervous system and sense organs: Secondary | ICD-10-CM | POA: Insufficient documentation

## 2019-12-09 DIAGNOSIS — L819 Disorder of pigmentation, unspecified: Secondary | ICD-10-CM

## 2019-12-09 DIAGNOSIS — E669 Obesity, unspecified: Secondary | ICD-10-CM

## 2019-12-09 DIAGNOSIS — E119 Type 2 diabetes mellitus without complications: Secondary | ICD-10-CM

## 2019-12-09 DIAGNOSIS — I10 Essential (primary) hypertension: Secondary | ICD-10-CM

## 2019-12-09 DIAGNOSIS — E1169 Type 2 diabetes mellitus with other specified complication: Secondary | ICD-10-CM

## 2019-12-09 DIAGNOSIS — E785 Hyperlipidemia, unspecified: Secondary | ICD-10-CM

## 2019-12-09 LAB — POCT GLYCOSYLATED HEMOGLOBIN (HGB A1C): HbA1c POC (<> result, manual entry): 5.6 % (ref 4.0–5.6)

## 2019-12-09 LAB — GLUCOSE, POCT (MANUAL RESULT ENTRY): POC Glucose: 79 mg/dl (ref 70–99)

## 2019-12-09 MED ORDER — GLIMEPIRIDE 2 MG PO TABS: 1.0000 mg | ORAL_TABLET | Freq: Every day | ORAL | 11 refills | Status: DC

## 2019-12-09 NOTE — Progress Notes (Signed)
Patient ID: [name], female    DOB: [birth year]  MRN: [id number]  CC: Diabetes and Hypertension   Subjective: [name] is a 69 y.o. female who presents for chronic ds management Her concerns today include:  Patient with history of DM, HTN, HL, OA.Marland Kitchen  HM:  Had COVID-19 vaccine but does not have card with her today  DIABETES TYPE 2 Last A1C:   Results for orders placed or performed in visit on 12/09/19  POCT glucose (manual entry)  Result Value Ref Range   POC Glucose 79 70 - 99 mg/dl  POCT glycosylated hemoglobin (Hb A1C)  Result Value Ref Range   Hemoglobin A1C     HbA1c POC (<> result, manual entry) 5.6 4.0 - 5.6 %   HbA1c, POC (prediabetic range)     HbA1c, POC (controlled diabetic range)      Med Adherence:  [x] Yes    [] No Medication side effects:  [] Yes    [] No Home Monitoring?  [x] Yes   But not often Home glucose results range:  BS 79 today, she has not eaten as yet for the day Diet Adherence: [x] Yes    [] No Exercise: [x] Yes  2 x a wk  [] No Hypoglycemic episodes?: [] Yes    [x] No Numbness of the feet? [] Yes    [x] No Retinopathy hx? [] Yes    [] No Last eye exam:  Has cataract.  Feels vision is getting worse. No insurance but would like referral nontheless Comments:   HYPERTENSION Currently taking: see medication list Med Adherence: [] Yes    [x] No Medication side effects: [] Yes    [x] No Adherence with salt restriction: [x] Yes    [] No Home Monitoring?: [] Yes    [] No Monitoring Frequency: [] Yes    [] No Home BP results range: [] Yes    [] No SOB? [] Yes    [x] No Chest Pain?: [] Yes    [x] No Leg swelling?: [] Yes    [x] No Headaches?: [] Yes    [x] No Dizziness? [] Yes    [x] No Comments:   Hyper-PTH: Osteopenia on BMD.  Vit D level was good while she was on Vit D 400 IU daily.  Prior to that Vit D level was 26.   Ca+ level stable at 10.9  HL: She is taking the atorvastatin as prescribed.  Has black spots on  cheeks for which she would like to see the dermatologist.   Used bleaching creams for yrs. Went to a place called [facility] (no dermatologist at this place).  Had skin peeling and scraping but no better.    Patient Active Problem List   Diagnosis Date Noted  . Screening breast examination 07/07/2019  . Primary hyperparathyroidism ([place]) 04/24/2019  . Adenomatous polyp of colon 07/29/2018  . Scarring of skin 07/16/2017  . Breast calcification, left 07/16/2017  . Immunization due 04/03/2017  . Hyperlipidemia associated with type 2 diabetes mellitus ([place]) 05/18/2016  . Primary osteoarthritis involving multiple joints 05/16/2016  . Diabetes type 2, controlled ([place]) 03/13/2015  . 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 Bilateral cataracts 03/13/2015  . Hypertension 10/06/2014     Current Outpatient Medications on File Prior to Visit  Medication Sig Dispense Refill  . atorvastatin (LIPITOR) 40 MG tablet Take 1 tablet (40 mg total) by mouth daily. 30 tablet 11  . Blood  Glucose Monitoring Suppl (TRUE METRIX METER) W/DEVICE KIT 1 each by Does not apply route as needed. 1 kit 0  . Cholecalciferol (VITAMIN D3) 10 MCG (400 UNIT) tablet Take 1 tablet (400 Units total) by mouth daily. 100 tablet 1  . Glucosamine-Chondroitin 750-600 MG TABS Take 2 tablets by mouth daily.  0  . glucose blood (TRUE METRIX BLOOD GLUCOSE TEST) test strip 1 each by Other route daily. (Patient not taking: Reported on 07/01/2018) 100 each 3  . hydrochlorothiazide (HYDRODIURIL) 25 MG tablet Take 1 tablet (25 mg total) by mouth daily. 30 tablet 6  . ibuprofen (ADVIL,MOTRIN) 400 MG tablet Take 400 mg by mouth every 6 (six) hours as needed.    Marland Kitchen NIFEdipine (PROCARDIA-XL/NIFEDICAL-XL) 30 MG 24 hr tablet Take 1 tablet (30 mg total) by mouth daily. 30 tablet 11  . TRUEPLUS LANCETS 28G MISC 1 each by Does not apply route daily. 100 each 3  . Turmeric 500 MG CAPS Take 500 mg by mouth daily.     Current  Facility-Administered Medications on File Prior to Visit  Medication Dose Route Frequency Provider Last Rate Last Admin  . 0.9 %  sodium chloride infusion  500 mL Intravenous Once [place], MD        No Known Allergies  Social History   Socioeconomic History  . Marital status: Married    Spouse name: Not on file  . Number of children: 3  . Years of education: 49   . Highest education level: High school graduate  Occupational History  . Not on file  Tobacco Use  . Smoking status: Never Smoker  . Smokeless tobacco: Never Used  Vaping Use  . Vaping Use: Never used  Substance and Sexual Activity  . Alcohol use: Not Currently    Comment: occassional  . Drug use: No  . Sexual activity: Not Currently    Birth control/protection: None  Other Topics Concern  . Not on file  Social History Narrative   From [place]   Moved to [place] on 09/11/2014      Has 3 children- 1 daughter in [place],    1 child in [place]   Social Determinants of Health   Financial Resource Strain:   . Difficulty of Paying Living Expenses:   Food Insecurity:   . Worried About Charity fundraiser in the Last Year:   . Arboriculturist in the Last Year:   Transportation Needs: Unmet Transportation Needs  . Lack of Transportation (Medical): Yes  . Lack of Transportation (Non-Medical): Yes  Physical Activity:   . Days of Exercise per Week:   . Minutes of Exercise per Session:   Stress:   . Feeling of Stress :   Social Connections:   . Frequency of Communication with Friends and Family:   . Frequency of Social Gatherings with Friends and Family:   . Attends Religious Services:   . Active Member of Clubs or Organizations:   . Attends Archivist Meetings:   Marland Kitchen Marital Status:   Intimate Partner Violence:   . Fear of Current or Ex-Partner:   . Emotionally Abused:   Marland Kitchen Physically Abused:   . Sexually Abused:     Family History  Problem Relation Age of Onset  . Hypertension Mother   . Diabetes  Mother   . Cancer Father        prostate  . Diabetes Sister   . Colon cancer Neg Hx   . Esophageal cancer Neg Hx   .  Rectal cancer Neg Hx   . Stomach cancer Neg Hx     Past Surgical History:  Procedure Laterality Date  . CESAREAN SECTION  1990    ROS: Review of Systems Negative except as stated above  PHYSICAL EXAM: BP 129/80   Pulse 69   Temp 97.7 F (36.5 C)   Resp 16   Wt 159 lb 9.6 oz (72.4 kg)   SpO2 96%   BMI 35.78 kg/m   Wt Readings from Last 3 Encounters:  12/09/19 159 lb 9.6 oz (72.4 kg)  09/09/19 153 lb 9.6 oz (69.7 kg)  03/03/19 155 lb (70.3 kg)    Physical Exam  General appearance - alert, well appearing, and in no distress Mental status - normal mood, behavior, speech, dress, motor activity, and thought processes Neck - supple, no significant adenopathy Chest - clear to auscultation, no wheezes, rales or rhonchi, symmetric air entry Heart - normal rate, regular rhythm, normal S1, S2, no murmurs, rubs, clicks or gallops Extremities - peripheral pulses normal, no pedal edema, no clubbing or cyanosis Skin -patient has moderate hyperpigmented treated areas over both cheeks. Diabetic Foot Exam - Simple   Simple Foot Form Visual Inspection No deformities, no ulcerations, no other skin breakdown bilaterally: Yes Sensation Testing Intact to touch and monofilament testing bilaterally: Yes Pulse Check Posterior Tibialis and Dorsalis pulse intact bilaterally: Yes Comments     CMP Latest Ref Rng & Units 09/09/2019 03/03/2019 05/06/2018  Glucose 65 - 99 mg/dL 117(H) 92 89  BUN 8 - 27 mg/dL 17 12 14  Creatinine 0.57 - 1.00 mg/dL 0.82 1.02(H) 0.70  Sodium 134 - 144 mmol/L 139 142 140  Potassium 3.5 - 5.2 mmol/L 4.2 4.0 4.0  Chloride 96 - 106 mmol/L 101 103 97  CO2 20 - 29 mmol/L 24 28 24  Calcium 8.7 - 10.3 mg/dL 10.9(H) 10.9(H) 10.5(H)  Total Protein 6.0 - 8.5 g/dL 7.4 7.6 -  Total Bilirubin 0.0 - 1.2 mg/dL 0.7 0.5 -  Alkaline Phos 39 - 117 IU/L 65 69 -    AST 0 - 40 IU/L 25 16 -  ALT 0 - 32 IU/L 21 16 -   Lipid Panel     Component Value Date/Time   CHOL 203 (H) 03/03/2019 1644   TRIG 72 03/03/2019 1644   HDL 71 03/03/2019 1644   CHOLHDL 2.9 03/03/2019 1644   CHOLHDL 3.3 05/16/2016 0928   VLDL 21 05/16/2016 0928   LDLCALC 119 (H) 03/03/2019 1644    CBC    Component Value Date/Time   WBC 6.5 03/03/2019 1644   WBC 5.2 05/16/2016 0944   RBC 4.42 03/03/2019 1644   RBC 4.72 05/16/2016 0944   HGB 13.0 03/03/2019 1644   HCT 40.0 03/03/2019 1644   PLT 266 03/03/2019 1644   MCV 91 03/03/2019 1644   MCH 29.4 03/03/2019 1644   MCH 29.7 05/16/2016 0944   MCHC 32.5 03/03/2019 1644   MCHC 34.2 05/16/2016 0944   RDW 13.6 03/03/2019 1644   LYMPHSABS 2.0 11/26/2017 0913   MONOABS 0.8 10/06/2014 1049   EOSABS 0.1 11/26/2017 0913   BASOSABS 0.1 11/26/2017 0913    ASSESSMENT AND PLAN:  1. Type 2 diabetes mellitus with obesity (HCC) At goal.  However blood sugar was low today in the office and patient was without symptoms.  We did give her a can of soda.  I recommend decreasing the Amaryl to 1 mg daily.  Continue healthy eating habits and regular exercise - POCT glucose (  manual entry) - POCT glycosylated hemoglobin (Hb A1C) - Ambulatory referral to Ophthalmology - glimepiride (AMARYL) 2 MG tablet; Take 0.5 tablets (1 mg total) by mouth daily with breakfast. TAKE 1 TABLET BY MOUTH DAILY BEFORE BREAKFAST.  Dispense: 30 tablet; Refill: 11  2. Essential hypertension At goal.  Continue current medications and low-salt diet  3. Hyperlipidemia associated with type 2 diabetes mellitus (HCC) Continue atorvastatin  4. Osteopenia of neck of right femur Restart vitamin D 400 IU daily.  Encourage weightbearing exercises.  5. Hyperpigmentation of skin of cheek - Ambulatory referral to Dermatology  6. History of cataract - Ambulatory referral to Ophthalmology  7. Primary hyperparathyroidism ([place]) Currently does not meet criteria for  surgical option.    Patient was given the opportunity to ask questions.  Patient verbalized understanding of the plan and was able to repeat key elements of the plan.   Orders Placed This Encounter  Procedures  . Ambulatory referral to Ophthalmology  . Ambulatory referral to Dermatology  . POCT glucose (manual entry)  . POCT glycosylated hemoglobin (Hb A1C)     Requested Prescriptions   Signed Prescriptions Disp Refills  . glimepiride (AMARYL) 2 MG tablet 30 tablet 11    Sig: Take 0.5 tablets (1 mg total) by mouth daily with breakfast. TAKE 1 TABLET BY MOUTH DAILY BEFORE BREAKFAST.    Return in about 4 months (around 04/10/2020).  Karle Plumber, MD, FACP

## 2019-12-09 NOTE — Patient Instructions (Signed)
Decrease Amaryl to half a tablet daily. You can restart vitamin D 400 IU daily.

## 2020-01-05 ENCOUNTER — Other Ambulatory Visit: Payer: Self-pay | Admitting: Internal Medicine

## 2020-01-05 DIAGNOSIS — I10 Essential (primary) hypertension: Secondary | ICD-10-CM

## 2020-03-06 ENCOUNTER — Other Ambulatory Visit: Payer: Self-pay | Admitting: Internal Medicine

## 2020-03-06 DIAGNOSIS — E785 Hyperlipidemia, unspecified: Secondary | ICD-10-CM

## 2020-03-06 DIAGNOSIS — E1169 Type 2 diabetes mellitus with other specified complication: Secondary | ICD-10-CM

## 2020-03-06 DIAGNOSIS — I10 Essential (primary) hypertension: Secondary | ICD-10-CM

## 2020-03-06 NOTE — Telephone Encounter (Signed)
Requested medication (s) are due for refill today: yes  Requested medication (s) are on Cassandra active medication list: yes  Last refill:  03/03/2019 #30 11 refills  Future visit scheduled: yes in one month  Notes to clinic:  medications expired , procardia XL 30 mg  and lipitor 40 mg. Do you want to renew Rx?     Requested Prescriptions  Pending Prescriptions Disp Refills   NIFEdipine (PROCARDIA-XL/NIFEDICAL-XL) 30 MG 24 hr tablet [Pharmacy Med Name: NIFEDIPINE ER OSMOTIC RELEA 30 Tablet] 30 tablet 11    Sig: TAKE 1 TABLET (30 MG TOTAL) BY MOUTH DAILY.      Cardiovascular:  Calcium Channel Blockers Passed - 03/06/2020 10:36 AM      Passed - Last BP in normal range    BP Readings from Last 1 Encounters:  12/09/19 129/80          Passed - Valid encounter within last 6 months    Recent Outpatient Visits           2 months ago Type 2 diabetes mellitus with obesity ([name])   Cassandra [name], [name]   5 months ago Primary hyperparathyroidism [name])   [name], [name], [name]   9 months ago Primary hyperparathyroidism [name])   [name], [name]   10 months ago Essential hypertension   [name], [name], [name]   10 months ago Essential hypertension   [name], [name]       Future Appointments             In 1 month [name], [name] [name]              atorvastatin (LIPITOR) 40 MG tablet [Pharmacy Med Name: ATORVASTATIN 40MG  TABLET 40 Tablet] 30 tablet 11    Sig: TAKE 1 TABLET (40 MG TOTAL) BY MOUTH DAILY.      Cardiovascular:  Antilipid - Statins Failed - 03/06/2020 10:36 AM      Failed - Total Cholesterol in normal range and within 360 days    Cholesterol, Total  Date Value Ref Range Status  03/03/2019 203  (H) 100 - 199 mg/dL Final          Failed - LDL in normal range and within 360 days    LDL Chol Calc (NIH)  Date Value Ref Range Status  03/03/2019 119 (H) 0 - 99 mg/dL Final          Failed - HDL in normal range and within 360 days    HDL  Date Value Ref Range Status  03/03/2019 71 >39 mg/dL Final          Failed - Triglycerides in normal range and within 360 days    Triglycerides  Date Value Ref Range Status  03/03/2019 72 0 - 149 mg/dL Final          Passed - Patient is not pregnant      Passed - Valid encounter within last 12 months    Recent Outpatient Visits           2 months ago Type 2 diabetes mellitus with obesity ([name])   Cassandra [name], [name]   5 months ago Primary hyperparathyroidism [name])   [name]  [name], [name]   9 months ago Primary hyperparathyroidism [name])   [name], [name]   10 months ago Essential hypertension   [name], [name], [name]   10 months ago Essential hypertension   [name], [name]       Future Appointments             In 1 month [name], [name] [name]             Signed Prescriptions Disp Refills   hydrochlorothiazide (HYDRODIURIL) 25 MG tablet 30 tablet 1    Sig: TAKE 1 TABLET (25 MG TOTAL) BY MOUTH DAILY.      Cardiovascular: Diuretics - Thiazide Failed - 03/06/2020 10:36 AM      Failed - Ca in normal range and within 360 days    Calcium  Date Value Ref Range Status  09/09/2019 10.9 (H) 8.7 - 10.3 mg/dL Final          Passed - Cr in normal range and within 360 days    Creat  Date Value Ref Range Status  05/16/2016 0.89 0.50 - 0.99 mg/dL Final    Comment:      For patients > or = 68 years of age: Cassandra upper reference limit for Creatinine  is approximately 13% higher for people identified as African-American.      Creatinine, Ser  Date Value Ref Range Status  09/09/2019 0.82 0.57 - 1.00 mg/dL Final   Creatinine, Urine  Date Value Ref Range Status  05/16/2016 29 20 - 320 mg/dL Final          Passed - K in normal range and within 360 days    Potassium  Date Value Ref Range Status  09/09/2019 4.2 3.5 - 5.2 mmol/Harrell Final          Passed - Na in normal range and within 360 days    Sodium  Date Value Ref Range Status  09/09/2019 139 134 - 144 mmol/Harrell Final          Passed - Last BP in normal range    BP Readings from Last 1 Encounters:  12/09/19 129/80          Passed - Valid encounter within last 6 months    Recent Outpatient Visits           2 months ago Type 2 diabetes mellitus with obesity ([name])   [name], [name]   5 months ago Primary hyperparathyroidism [name])   [name], [name], [name]   9 months ago Primary hyperparathyroidism [name])   [name], [name], [name]   10 months ago Essential hypertension   [name], [name], [name]   10 months ago Essential hypertension   [name], [name], [name]       Future Appointments             In 1 month [name], [name], [name] [name]

## 2020-03-06 NOTE — Telephone Encounter (Signed)
Please fill if appropriate.  

## 2020-03-09 ENCOUNTER — Other Ambulatory Visit: Payer: Self-pay | Admitting: Internal Medicine

## 2020-03-23 ENCOUNTER — Ambulatory Visit: Payer: No Typology Code available for payment source

## 2020-03-26 ENCOUNTER — Telehealth: Payer: Self-pay

## 2020-03-26 NOTE — Telephone Encounter (Signed)
Copied from [place] [id number]. Topic: General - Other >> Mar 20, 2020  2:16 PM Leward Quan A wrote: Reason for CRM: Patient called to reschedule her appointment that is for financial counseling asking to be changed to  03/29/20 after lunch time. Please advise  Ph# [phone]

## 2020-03-27 ENCOUNTER — Telehealth: Payer: Self-pay | Admitting: Internal Medicine

## 2020-03-27 NOTE — Telephone Encounter (Signed)
I  Call back the Pt and confirm the appt for 04/30/20 with financial

## 2020-03-30 ENCOUNTER — Ambulatory Visit: Payer: No Typology Code available for payment source | Attending: Internal Medicine

## 2020-03-30 ENCOUNTER — Other Ambulatory Visit: Payer: Self-pay

## 2020-04-10 ENCOUNTER — Telehealth: Payer: No Typology Code available for payment source | Admitting: Internal Medicine

## 2020-04-11 ENCOUNTER — Other Ambulatory Visit: Payer: Self-pay | Admitting: Internal Medicine

## 2020-04-11 DIAGNOSIS — I10 Essential (primary) hypertension: Secondary | ICD-10-CM

## 2020-04-11 DIAGNOSIS — E785 Hyperlipidemia, unspecified: Secondary | ICD-10-CM

## 2020-04-11 DIAGNOSIS — E1169 Type 2 diabetes mellitus with other specified complication: Secondary | ICD-10-CM

## 2020-04-11 NOTE — Telephone Encounter (Signed)
Requested Prescriptions  Pending Prescriptions Disp Refills   atorvastatin (LIPITOR) 40 MG tablet [Pharmacy Med Name: ATORVASTATIN 40MG  TABLET 40 Tablet] 30 tablet 1    Sig: TAKE 1 TABLET (40 MG TOTAL) BY MOUTH DAILY.     Cardiovascular:  Antilipid - Statins Failed - 04/11/2020 10:39 AM      Failed - Total Cholesterol in normal range and within 360 days    Cholesterol, Total  Date Value Ref Range Status  03/03/2019 203 (H) 100 - 199 mg/dL Final         Failed - LDL in normal range and within 360 days    LDL Chol Calc (NIH)  Date Value Ref Range Status  03/03/2019 119 (H) 0 - 99 mg/dL Final         Failed - HDL in normal range and within 360 days    HDL  Date Value Ref Range Status  03/03/2019 71 >39 mg/dL Final         Failed - Triglycerides in normal range and within 360 days    Triglycerides  Date Value Ref Range Status  03/03/2019 72 0 - 149 mg/dL Final         Passed - Patient is not pregnant      Passed - Valid encounter within last 12 months    Recent Outpatient Visits          4 months ago Type 2 diabetes mellitus with obesity ([place])   [place], [name], MD   7 months ago Primary hyperparathyroidism [facility])   [place], [name], NP   11 months ago Primary hyperparathyroidism [facility])   [place], [name], MD   11 months ago Essential hypertension   [place], [name], RPH-CPP   12 months ago Essential hypertension   [place], RPH-CPP

## 2020-04-20 ENCOUNTER — Telehealth: Payer: Self-pay | Admitting: Internal Medicine

## 2020-04-20 NOTE — Telephone Encounter (Signed)
Patient requesting a call back for clarification on her financial assistance. Please follow up.

## 2020-04-20 NOTE — Telephone Encounter (Signed)
I return Pt call, she was inform that she is approve for the CAFA and will be sending a copy of the letter to her

## 2020-05-18 ENCOUNTER — Other Ambulatory Visit: Payer: Self-pay | Admitting: Internal Medicine

## 2020-05-18 DIAGNOSIS — I10 Essential (primary) hypertension: Secondary | ICD-10-CM

## 2020-06-08 ENCOUNTER — Ambulatory Visit: Payer: Self-pay | Attending: Internal Medicine | Admitting: Internal Medicine

## 2020-06-08 ENCOUNTER — Other Ambulatory Visit: Payer: Self-pay

## 2020-06-08 DIAGNOSIS — E559 Vitamin D deficiency, unspecified: Secondary | ICD-10-CM

## 2020-06-08 DIAGNOSIS — Z23 Encounter for immunization: Secondary | ICD-10-CM

## 2020-06-08 DIAGNOSIS — E1169 Type 2 diabetes mellitus with other specified complication: Secondary | ICD-10-CM

## 2020-06-08 DIAGNOSIS — E210 Primary hyperparathyroidism: Secondary | ICD-10-CM

## 2020-06-08 DIAGNOSIS — I10 Essential (primary) hypertension: Secondary | ICD-10-CM

## 2020-06-08 DIAGNOSIS — E669 Obesity, unspecified: Secondary | ICD-10-CM

## 2020-06-08 DIAGNOSIS — E785 Hyperlipidemia, unspecified: Secondary | ICD-10-CM

## 2020-06-08 NOTE — Progress Notes (Signed)
Pt states she is taking a whole tablet of glimepride because her sugars were high  with a half of tablet. Pt states since she has been taking the whole her blood sugars has been below 110

## 2020-06-08 NOTE — Progress Notes (Signed)
Virtual Visit via Telephone Note  I connected with [name] on 06/08/20 at 4:40 p.m by telephone and verified that I am speaking with the correct person using two identifiers.  Location: Patient: home Provider: office  The patient, my CMA Sallyanne Havers and myself participated in this encounter. I discussed the limitations, risks, security and privacy concerns of performing an evaluation and management service by telephone and the availability of in person appointments. I also discussed with the patient that there may be a patient responsible charge related to this service. The patient expressed understanding and agreed to proceed.   History of Present Illness: Patient with history of DM, HTN, HL, OA, hyperPTH, osteopenia..  DM: increase Amaryl to 2 mg daily 2 wks ago because BS were high on just 1 mg..  Checks BS daily in the mornings.  Range 93-112 Doing good with eating habits Does stretching exercises daily Await appt for eye exam  HYPERTENSION Currently taking: see medication list.  She is on hydrochlorothiazide and nifedipine. Med Adherence: _0  Yes    _1  No Medication side effects: _2  Yes    _3  No Adherence with salt restriction: _4  Yes    _5  No Home Monitoring?: _6  Yes    _7  No Monitoring Frequency:  Checks a few times a mth Home BP results range:  131/75 SOB? _8  Yes    _9  No Chest Pain?: _10  Yes    _11  No Leg swelling?: _12  Yes    _13  No Headaches?: _14  Yes    _15  No Dizziness? _16  Yes    _17  No Comments:   HL:  Taking and tolerating Lipitor  Hyperparathyroidism/vitamin D deficiency: Taking vitamin D supplement.  Up-to-date with bone density study.  HM: completed COVID19 vaccine  Outpatient Encounter Medications as of 06/08/2020  Medication Sig  . atorvastatin (LIPITOR) 40 MG tablet TAKE 1 TABLET (40 MG TOTAL) BY MOUTH DAILY.  Marland Kitchen Blood Glucose Monitoring Suppl (TRUE METRIX METER) W/DEVICE KIT 1 each by Does not apply route as needed.  . Cholecalciferol  (VITAMIN D3) 10 MCG (400 UNIT) tablet Take 1 tablet (400 Units total) by mouth daily.  Marland Kitchen glimepiride (AMARYL) 2 MG tablet Take 0.5 tablets (1 mg total) by mouth daily with breakfast. TAKE 1 TABLET BY MOUTH DAILY BEFORE BREAKFAST.  Marland Kitchen Glucosamine-Chondroitin 750-600 MG TABS Take 2 tablets by mouth daily.  Marland Kitchen glucose blood (TRUE METRIX BLOOD GLUCOSE TEST) test strip 1 each by Other route daily. (Patient not taking: Reported on 07/01/2018)  . hydrochlorothiazide (HYDRODIURIL) 25 MG tablet TAKE 1 TABLET (25 MG TOTAL) BY MOUTH DAILY.  Marland Kitchen ibuprofen (ADVIL,MOTRIN) 400 MG tablet Take 400 mg by mouth every 6 (six) hours as needed.  Marland Kitchen NIFEdipine (PROCARDIA-XL/NIFEDICAL-XL) 30 MG 24 hr tablet TAKE 1 TABLET (30 MG TOTAL) BY MOUTH DAILY.  Marland Kitchen TRUEPLUS LANCETS 28G MISC 1 each by Does not apply route daily.  . Turmeric 500 MG CAPS Take 500 mg by mouth daily.   Facility-Administered Encounter Medications as of 06/08/2020  Medication  . 0.9 %  sodium chloride infusion     Observations/Objective: Direct observation not done as this was a telephone encounter.  Results for orders placed or performed in visit on 12/09/19  POCT glucose (manual entry)  Result Value Ref Range   POC Glucose 79 70 - 99 mg/dl  POCT glycosylated hemoglobin (Hb A1C)  Result Value Ref Range   Hemoglobin A1C     HbA1c POC (<> result, manual entry) 5.6 4.0 - 5.6 %   HbA1c, POC (  prediabetic range)     HbA1c, POC (controlled diabetic range)       Assessment and Plan: 1. Type 2 diabetes mellitus with obesity ([place]) Reported blood sugar readings are at goal.  Increase glimepiride might to 2 mg daily.  Continue to monitor blood sugars.  Continue healthy eating habits and regular exercise - Comprehensive metabolic panel; Future - CBC; Future - Lipid panel; Future - Hemoglobin A1c; Future - Microalbumin / creatinine urine ratio; Future  2. Essential hypertension Reported reading at goal.  Continue current medications and low-salt  diet.  3. Hyperlipidemia associated with type 2 diabetes mellitus (HCC) Continue atorvastatin.  4. Primary hyperparathyroidism (HCC) - PTH, Intact (ICMA) and Ionized Calcium; Future  5. Vitamin D deficiency - VITAMIN D 25 Hydroxy (Vit-D Deficiency, Fractures); Future  6.  Need for influenza.  She will come next week to get this vaccine from our clinical pharmacist. Follow Up Instructions: 4 mths   I discussed the assessment and treatment plan with the patient. The patient was provided an opportunity to ask questions and all were answered. The patient agreed with the plan and demonstrated an understanding of the instructions.   The patient was advised to call back or seek an in-person evaluation if the symptoms worsen or if the condition fails to improve as anticipated.  I provided 11 minutes of non-face-to-face time during this encounter.   Karle Plumber, MD

## 2020-06-15 ENCOUNTER — Other Ambulatory Visit: Payer: Self-pay | Admitting: Internal Medicine

## 2020-06-15 ENCOUNTER — Ambulatory Visit: Payer: Self-pay | Attending: Internal Medicine | Admitting: Pharmacist

## 2020-06-15 ENCOUNTER — Other Ambulatory Visit: Payer: Self-pay

## 2020-06-15 ENCOUNTER — Telehealth: Payer: Self-pay | Admitting: Internal Medicine

## 2020-06-15 DIAGNOSIS — E1169 Type 2 diabetes mellitus with other specified complication: Secondary | ICD-10-CM

## 2020-06-15 DIAGNOSIS — E669 Obesity, unspecified: Secondary | ICD-10-CM

## 2020-06-15 DIAGNOSIS — E559 Vitamin D deficiency, unspecified: Secondary | ICD-10-CM

## 2020-06-15 DIAGNOSIS — E210 Primary hyperparathyroidism: Secondary | ICD-10-CM

## 2020-06-15 DIAGNOSIS — Z23 Encounter for immunization: Secondary | ICD-10-CM

## 2020-06-15 NOTE — Telephone Encounter (Signed)
Called patient and LVm to schedule a 4 month f/u with Dr. [name] (around 10/06/2020). Advised patient to call [phone] to schedule.

## 2020-06-15 NOTE — Progress Notes (Signed)
Patient presents for vaccination against influenza per orders of Dr. [name]. Consent given. Counseling provided. No contraindications exists. Vaccine administered without incident.   Benard Halsted, PharmD, [place], [place] [phone]

## 2020-06-16 DIAGNOSIS — Z23 Encounter for immunization: Secondary | ICD-10-CM | POA: Diagnosis not present

## 2020-06-16 DIAGNOSIS — Z7185 Encounter for immunization safety counseling: Secondary | ICD-10-CM | POA: Diagnosis not present

## 2020-06-16 LAB — COMPREHENSIVE METABOLIC PANEL
ALT: 18 IU/L (ref 0–32)
AST: 24 IU/L (ref 0–40)
Albumin/Globulin Ratio: 1.3 (ref 1.2–2.2)
Albumin: 4.6 g/dL (ref 3.8–4.8)
Alkaline Phosphatase: 67 IU/L (ref 44–121)
BUN/Creatinine Ratio: 22 (ref 12–28)
BUN: 17 mg/dL (ref 8–27)
Bilirubin Total: 0.7 mg/dL (ref 0.0–1.2)
CO2: 25 mmol/L (ref 20–29)
Calcium: 10.7 mg/dL — ABNORMAL HIGH (ref 8.7–10.3)
Chloride: 101 mmol/L (ref 96–106)
Creatinine, Ser: 0.78 mg/dL (ref 0.57–1.00)
GFR calc Af Amer: 90 mL/min/{1.73_m2} (ref >59)
GFR calc non Af Amer: 78 mL/min/{1.73_m2} (ref >59)
Globulin, Total: 3.5 g/dL (ref 1.5–4.5)
Glucose: 104 mg/dL — ABNORMAL HIGH (ref 65–99)
Potassium: 4.2 mmol/L (ref 3.5–5.2)
Sodium: 139 mmol/L (ref 134–144)
Total Protein: 8.1 g/dL (ref 6.0–8.5)

## 2020-06-16 LAB — CBC
Hematocrit: 40.9 % (ref 34.0–46.6)
Hemoglobin: 13.4 g/dL (ref 11.1–15.9)
MCH: 29.6 pg (ref 26.6–33.0)
MCHC: 32.8 g/dL (ref 31.5–35.7)
MCV: 90 fL (ref 79–97)
Platelets: 251 10*3/uL (ref 150–450)
RBC: 4.53 x10E6/uL (ref 3.77–5.28)
RDW: 13.4 % (ref 11.7–15.4)
WBC: 5 10*3/uL (ref 3.4–10.8)

## 2020-06-16 LAB — LIPID PANEL
Chol/HDL Ratio: 2.1 ratio (ref 0.0–4.4)
Cholesterol, Total: 146 mg/dL (ref 100–199)
HDL: 70 mg/dL (ref >39)
LDL Chol Calc (NIH): 67 mg/dL (ref 0–99)
Triglycerides: 37 mg/dL (ref 0–149)
VLDL Cholesterol Cal: 9 mg/dL (ref 5–40)

## 2020-06-16 LAB — PARATHYROID HORMONE, INTACT (NO CA): PTH: 66 pg/mL — ABNORMAL HIGH (ref 15–65)

## 2020-06-16 LAB — VITAMIN D 25 HYDROXY (VIT D DEFICIENCY, FRACTURES): Vit D, 25-Hydroxy: 35 ng/mL (ref 30.0–100.0)

## 2020-06-16 LAB — MICROALBUMIN / CREATININE URINE RATIO
Creatinine, Urine: 17 mg/dL
Microalb/Creat Ratio: <18 mg/g creat (ref 0–29)
Microalbumin, Urine: <3 ug/mL

## 2020-06-16 LAB — HEMOGLOBIN A1C
Est. average glucose Bld gHb Est-mCnc: 117 mg/dL
Hgb A1c MFr Bld: 5.7 % — ABNORMAL HIGH (ref 4.8–5.6)

## 2020-06-17 NOTE — Progress Notes (Signed)
Let patient know that she does not have any significant protein buildup in the urine which is good.  A1c is 5.7 with goal being less than 7.  This means her diabetes is well controlled.  Her vitamin D level is good.  Cholesterol level normal.  Blood cell counts including white blood cell, red blood cell and platelet counts are normal.  Kidney and liver function tests are normal.  Calcium and parathyroid hormone levels still elevated due to hyperparathyroidism.

## 2020-06-18 ENCOUNTER — Telehealth: Payer: Self-pay

## 2020-06-18 NOTE — Telephone Encounter (Signed)
Contacted pt to go over lab results pt is aware and doesn't have any questions or concerns 

## 2020-06-20 ENCOUNTER — Other Ambulatory Visit: Payer: Self-pay | Admitting: Internal Medicine

## 2020-06-20 DIAGNOSIS — I10 Essential (primary) hypertension: Secondary | ICD-10-CM

## 2020-06-20 DIAGNOSIS — E785 Hyperlipidemia, unspecified: Secondary | ICD-10-CM

## 2020-06-20 DIAGNOSIS — E1169 Type 2 diabetes mellitus with other specified complication: Secondary | ICD-10-CM

## 2020-07-19 ENCOUNTER — Other Ambulatory Visit: Payer: Self-pay

## 2020-07-19 DIAGNOSIS — Z1231 Encounter for screening mammogram for malignant neoplasm of breast: Secondary | ICD-10-CM

## 2020-07-26 ENCOUNTER — Other Ambulatory Visit: Payer: Self-pay | Admitting: Ophthalmology

## 2020-08-23 ENCOUNTER — Ambulatory Visit: Payer: Self-pay | Admitting: *Deleted

## 2020-08-23 ENCOUNTER — Encounter (INDEPENDENT_AMBULATORY_CARE_PROVIDER_SITE_OTHER): Payer: Self-pay

## 2020-08-23 ENCOUNTER — Other Ambulatory Visit: Payer: Self-pay

## 2020-08-23 ENCOUNTER — Ambulatory Visit: Payer: No Typology Code available for payment source

## 2020-08-23 VITALS — BP 132/86 | Wt 162.4 lb

## 2020-08-23 DIAGNOSIS — Z1239 Encounter for other screening for malignant neoplasm of breast: Secondary | ICD-10-CM

## 2020-08-23 DIAGNOSIS — R2231 Localized swelling, mass and lump, right upper limb: Secondary | ICD-10-CM

## 2020-08-23 NOTE — Progress Notes (Signed)
[name] is a 70 y.o. female who presents to [facility] clinic today with no complaints.    Pap Smear: Pap smear not completed today. Last Pap smear was 6/29/2016at [place] normal with negative HPV. Per patient has no history of an abnormal Pap smear. No further Pap smears needed due to patient is greater than 38 years old with no history of an abnormal Pap smear. Last Pap smear result isin Epic.   Physical exam: Breasts Left breast slightly larger than right breast that per patient has not noticed any changes. No skin abnormalities bilateral breasts. No nipple retraction bilateral breasts. No nipple discharge bilateral breasts. No lymphadenopathy. No lumps palpated left breast. Palpated a right axillary lump at 11 o'clock 15 cm from the nipple. No complaints of pain or tenderness on exam.  MM Digital Diagnostic Unilat L  Result Date: 06/11/2017 CLINICAL DATA:  70 year old female recalled from screening mammogram dated 05/28/2017 for left breast calcifications. EXAM: DIGITAL DIAGNOSTIC LEFT MAMMOGRAM WITH CAD COMPARISON:  Previous exam(s). ACR Breast Density Category b: There are scattered areas of fibroglandular density. FINDINGS: Round and punctate calcifications are demonstrated in the upper outer left breast pending from the posterior to middle depth. No other calcifications are identified throughout either breast. Mammographic images were processed with CAD. IMPRESSION: Probably benign left breast calcifications for which six-month follow-up is recommended. RECOMMENDATION: Diagnostic left breast mammogram in 6 months. I have discussed the findings and recommendations with the patient. Results were also provided in writing at the conclusion of the visit. If applicable, a reminder letter will be sent to the patient regarding the next appointment. BI-RADS CATEGORY  3: Probably benign. Electronically Signed   By: [name] M.D.   On: 06/11/2017  08:00   MS DIGITAL SCREENING BILATERAL  Result Date: 05/28/2017 CLINICAL DATA:  Screening. EXAM: DIGITAL SCREENING BILATERAL MAMMOGRAM WITH CAD COMPARISON:  Previous exam(s). ACR Breast Density Category b: There are scattered areas of fibroglandular density. FINDINGS: In the left breast, calcifications warrant further evaluation. In the right breast, no findings suspicious for malignancy. Images were processed with CAD. IMPRESSION: Further evaluation is suggested for calcifications in the left breast. RECOMMENDATION: Diagnostic mammogram of the left breast. (Code:[id number]) The patient will be contacted regarding the findings, and additional imaging will be scheduled. BI-RADS CATEGORY  0: Incomplete. Need additional imaging evaluation and/or prior mammograms for comparison. Electronically Signed   By: [name] M.D.   On: 05/28/2017 09:40   MM DIAG BREAST TOMO UNI LEFT  Result Date: 12/23/2017 CLINICAL DATA:  Six-month follow-up of left breast calcifications EXAM: DIGITAL DIAGNOSTIC UNILATERAL LEFT MAMMOGRAM WITH CAD AND TOMO COMPARISON:  Previous exam(s). ACR Breast Density Category b: There are scattered areas of fibroglandular density. FINDINGS: The scattered round and punctate calcifications in the upper outer left breast are stable. No other suspicious findings. Mammographic images were processed with CAD. IMPRESSION: Probably benign left breast calcifications, unchanged. RECOMMENDATION: Six-month follow-up mammography of the probably benign left breast calcifications. The patient will be due for bilateral mammography at that time. I have discussed the findings and recommendations with the patient. Results were also provided in writing at the conclusion of the visit. If applicable, a reminder letter will be sent to the patient regarding the next appointment. BI-RADS CATEGORY  3: Probably benign. Electronically Signed   By: [name] M.D   On: 12/23/2017 14:08   MS DIGITAL SCREENING  TOMO BILATERAL  Result Date: 07/08/2019 CLINICAL DATA:  Screening.  EXAM: DIGITAL SCREENING BILATERAL MAMMOGRAM WITH TOMO AND CAD COMPARISON:  Previous exam(s). ACR Breast Density Category b: There are scattered areas of fibroglandular density. FINDINGS: There are no findings suspicious for malignancy. Images were processed with CAD. IMPRESSION: No mammographic evidence of malignancy. A result letter of this screening mammogram will be mailed directly to the patient. RECOMMENDATION: Screening mammogram in one year. (Code:[id number]) BI-RADS CATEGORY  1: Negative. Electronically Signed   By: [name] M.D   On: 07/08/2019 16:39   MS DIGITAL DIAG TOMO BILAT  Result Date: 07/01/2018 CLINICAL DATA:  Follow-up probably benign left breast calcifications. EXAM: DIGITAL DIAGNOSTIC BILATERAL MAMMOGRAM WITH CAD AND TOMO COMPARISON:  Previous exam(s). ACR Breast Density Category b: There are scattered areas of fibroglandular density. FINDINGS: The previously demonstrated probably benign scattered calcifications in the upper-outer left breast are fewer in number than seen on 12/23/2017 and 06/11/2017. The remaining calcifications continued have punctate, rounded and oval configurations. Mammographic images were processed with CAD. IMPRESSION: Improved benign appearing left breast calcifications. These do not need further follow-up. RECOMMENDATION: Bilateral screening mammogram in 1 year. I have discussed the findings and recommendations with the patient. Results were also provided in writing at the conclusion of the visit. If applicable, a reminder letter will be sent to the patient regarding the next appointment. BI-RADS CATEGORY  2: Benign. Electronically Signed   By: [name] M.D.   On: 07/01/2018 10:09   Pelvic/Bimanual Pap is not indicated today per BCCCP guidelines.   Smoking History: Patient has never smoked.   Patient Navigation: Patient education provided. Access to services provided for  patient through [employer]. Transportation provided home today. Transportation arranged for patient to get to appointment at the [facility] and home following mammogram.  Colorectal Cancer Screening: Patient has had colonoscopy completed on 05/20/2018. No complaints today.    Breast and Cervical Cancer Risk Assessment: Patient does not have family history of breast cancer, known genetic mutations, or radiation treatment to the chest before age 27. Patient does not have history of cervical dysplasia, immunocompromised, or DES exposure in-utero.  Risk Assessment   No risk assessment data for the current encounter  Risk Scores      07/07/2019   Last edited by: Demetrius Revel, LPN   5-year risk: 1.7 %   Lifetime risk: 5.5 %         A: BCCCP exam without pap smear No complaints.  P: Referred patient to the [facility] for a diagnostic mammogram. Appointment scheduled Tuesday, September 11, 2020 at [place].  Loletta Parish, RN 08/23/2020 10:43 AM

## 2020-08-23 NOTE — Patient Instructions (Signed)
Explained breast self awareness with [name]. Patient did not need a Pap smear today due to patient is greater than 70 years of age with no history of an abnormal Pap smear. Let her know that she doesn't need any further Pap smears. Referred patient to the [facility] for a diagnostic mammogram. Appointment scheduled Tuesday, September 11, 2020 at [place]. Patient aware of appointment and will be there. [name] verbalized understanding.  [name], Arvil Chaco, RN 10:43 AM

## 2020-09-01 HISTORY — PX: PTERYGIUM EXCISION: SHX2273

## 2020-09-11 ENCOUNTER — Other Ambulatory Visit: Payer: No Typology Code available for payment source

## 2020-09-18 ENCOUNTER — Other Ambulatory Visit: Payer: Self-pay

## 2020-09-18 MED FILL — Atorvastatin Calcium Tab 40 MG (Base Equivalent): ORAL | 30 days supply | Qty: 30 | Fill #0 | Status: AC

## 2020-09-18 MED FILL — Hydrochlorothiazide Tab 25 MG: ORAL | 30 days supply | Qty: 30 | Fill #0 | Status: AC

## 2020-09-18 MED FILL — Glimepiride Tab 2 MG: ORAL | 30 days supply | Qty: 30 | Fill #0 | Status: AC

## 2020-09-18 MED FILL — Nifedipine Tab ER 24HR Osmotic Release 30 MG: ORAL | 30 days supply | Qty: 30 | Fill #0 | Status: AC

## 2020-09-20 ENCOUNTER — Ambulatory Visit
Admission: RE | Admit: 2020-09-20 | Discharge: 2020-09-20 | Disposition: A | Discharge status: Home / Self Care | Payer: No Typology Code available for payment source | Source: Ambulatory Visit | Attending: Obstetrics and Gynecology | Admitting: Obstetrics and Gynecology

## 2020-09-20 ENCOUNTER — Other Ambulatory Visit: Payer: Self-pay

## 2020-09-21 ENCOUNTER — Encounter: Payer: Self-pay | Admitting: *Deleted

## 2020-09-28 ENCOUNTER — Other Ambulatory Visit: Payer: Self-pay

## 2020-09-28 MED ORDER — PREDNISOLONE ACETATE 1 % OP SUSP
OPHTHALMIC | 0 refills | Status: DC
  Filled 2020-09-28: qty 10, 53d supply, fill #0
  Filled 2020-10-05: qty 10, 30d supply, fill #0

## 2020-09-30 DIAGNOSIS — Z419 Encounter for procedure for purposes other than remedying health state, unspecified: Secondary | ICD-10-CM | POA: Diagnosis not present

## 2020-10-04 ENCOUNTER — Other Ambulatory Visit: Payer: No Typology Code available for payment source

## 2020-10-05 ENCOUNTER — Other Ambulatory Visit: Payer: Self-pay

## 2020-10-08 ENCOUNTER — Other Ambulatory Visit: Payer: Self-pay

## 2020-10-08 ENCOUNTER — Encounter: Payer: Self-pay | Admitting: Internal Medicine

## 2020-10-08 ENCOUNTER — Other Ambulatory Visit: Payer: Self-pay | Admitting: Internal Medicine

## 2020-10-08 ENCOUNTER — Ambulatory Visit: Payer: Medicaid Other | Attending: Internal Medicine | Admitting: Internal Medicine

## 2020-10-08 VITALS — BP 122/72 | HR 77 | Ht <= 58 in | Wt 170.0 lb

## 2020-10-08 DIAGNOSIS — E785 Hyperlipidemia, unspecified: Secondary | ICD-10-CM

## 2020-10-08 DIAGNOSIS — I10 Essential (primary) hypertension: Secondary | ICD-10-CM | POA: Diagnosis not present

## 2020-10-08 DIAGNOSIS — E1169 Type 2 diabetes mellitus with other specified complication: Secondary | ICD-10-CM

## 2020-10-08 DIAGNOSIS — H25013 Cortical age-related cataract, bilateral: Secondary | ICD-10-CM | POA: Diagnosis not present

## 2020-10-08 DIAGNOSIS — E669 Obesity, unspecified: Secondary | ICD-10-CM

## 2020-10-08 MED ORDER — NIFEDIPINE ER OSMOTIC RELEASE 30 MG PO TB24
ORAL_TABLET | Freq: Three times a day (TID) | ORAL | 3 refills | Status: DC | PRN
  Filled 2020-10-08: qty 30, fill #0
  Filled 2020-10-16: qty 30, 30d supply, fill #0
  Filled 2020-11-27: qty 30, 30d supply, fill #1

## 2020-10-08 NOTE — Progress Notes (Signed)
Patient ID: Acsa Ernie Sagrero, female    DOB: [birth year]  MRN: [id number]  CC: Hypertension   Subjective: Talula Kindig is a 70 y.o. female who presents for chronic ds management Her concerns today include:  Patient with history of DM, HTN, HL, OA, hyperPTH, osteopenia..  DM: checks BS daily in a.m.  Gives range in the 90s.  Lowest 84 Doing well with eating habits but more than should.  She has not been walking or exercising much as before.  She has gained 8 pounds since March of this year. Taking Amaryl 52m 1 tab instead of 1/2 daily Had pterygium removed on LT eye 09/01/2020, RT tomorrow.  Then catarct extraction after 2 mths.  Currently ophthalmologist does not take her insurance.  Wants referral to ophthalmologist who takes her insurance.   HTN:  She stopped HCTZ 1 mth ago because she heard that it may cause cancer. Still taking Nefedipine Checks BP regularly.  Gives range of 125-131/70s Limits salt in foods  HL: tolerating Lipitor Patient Active Problem List   Diagnosis Date Noted  . Osteopenia of neck of right femur 12/09/2019  . History of cataract 12/09/2019  . Screening breast examination 07/07/2019  . Primary hyperparathyroidism ([facility] 04/24/2019  . Adenomatous polyp of colon 07/29/2018  . Scarring of skin 07/16/2017  . Breast calcification, left 07/16/2017  . Immunization due 04/03/2017  . Hyperlipidemia associated with type 2 diabetes mellitus ([place] 05/18/2016  . Primary osteoarthritis involving multiple joints 05/16/2016  . Diabetes type 2, controlled ([place] 03/13/2015  . 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 Bilateral cataracts 03/13/2015  . Hypertension 10/06/2014     Current Outpatient Medications on File Prior to Visit  Medication Sig Dispense Refill  . atorvastatin (LIPITOR) 40 MG tablet TAKE 1 TABLET (40 MG TOTAL) BY MOUTH DAILY. 30 tablet 11  . Blood Glucose Monitoring Suppl (TRUE METRIX METER) W/DEVICE KIT 1 each by Does not apply route as needed.  1 kit 0  . Cholecalciferol (VITAMIN D3) 10 MCG (400 UNIT) tablet Take 1 tablet (400 Units total) by mouth daily. 100 tablet 1  . glimepiride (AMARYL) 2 MG tablet TAKE 1/2 TABLET BY MOUTH DAILY WITH BREAKFAST. 30 tablet 11  . Glucosamine-Chondroitin 750-600 MG TABS Take 2 tablets by mouth daily.  0  . LOTEMAX SM 0.38 % GEL INSTILL 1 DROP IN LEFT EYE AS DIRECTED; 3 TIMES A DAY FOR 1 WEEK, THEN 2 TIMES A DAY FOR 1 WEEK, THEN ONCE A DAY UNTIL GONE 5 g 0  . NIFEdipine (PROCARDIA-XL/NIFEDICAL-XL) 30 MG 24 hr tablet TAKE 1 TABLET (30 MG TOTAL) BY MOUTH DAILY. 30 tablet 3  . PROLENSA 0.07 % SOLN INSTILL 1 DROP IN LEFT EYE DAILY; START 5 DAYS PRIOR TO SURGERY AND CONTNUE UNTIL GONE 3 mL 0  . TRUEPLUS LANCETS 28G MISC 1 each by Does not apply route daily. 100 each 3  . Turmeric 500 MG CAPS Take 500 mg by mouth daily.    .Marland Kitchenglucose blood (TRUE METRIX BLOOD GLUCOSE TEST) test strip 1 each by Other route daily. (Patient not taking: No sig reported) 100 each 3  . hydrochlorothiazide (HYDRODIURIL) 25 MG tablet TAKE 1 TABLET (25 MG TOTAL) BY MOUTH DAILY. (Patient not taking: Reported on 10/08/2020) 30 tablet 3  . ibuprofen (ADVIL,MOTRIN) 400 MG tablet Take 400 mg by mouth every 6 (six) hours as needed. (Patient not taking: No sig reported)    . prednisoLONE acetate (PRED FORTE) 1 % ophthalmic suspension Instill 1 drop in  right eye three times a day (Patient not taking: Reported on 10/08/2020) 10 mL 0   Current Facility-Administered Medications on File Prior to Visit  Medication Dose Route Frequency Provider Last Rate Last Admin  . 0.9 %  sodium chloride infusion  500 mL Intravenous Once [place], MD        No Known Allergies  Social History   Socioeconomic History  . Marital status: Married    Spouse name: Not on file  . Number of children: 3  . Years of education: 44   . Highest education level: High school graduate  Occupational History  . Not on file  Tobacco Use  . Smoking status: Never  Smoker  . Smokeless tobacco: Never Used  Vaping Use  . Vaping Use: Never used  Substance and Sexual Activity  . Alcohol use: Not Currently    Comment: rarely  . Drug use: No  . Sexual activity: Not Currently    Birth control/protection: Post-menopausal  Other Topics Concern  . Not on file  Social History Narrative   From [place]   Moved to [place] on 09/11/2014      Has 3 children- 1 daughter in [place],    1 child in [place]   Social Determinants of Health   Financial Resource Strain: Not on file  Food Insecurity: Not on file  Transportation Needs: Unmet Transportation Needs  . Lack of Transportation (Medical): Yes  . Lack of Transportation (Non-Medical): Yes  Physical Activity: Not on file  Stress: Not on file  Social Connections: Not on file  Intimate Partner Violence: Not on file    Family History  Problem Relation Age of Onset  . Hypertension Mother   . Diabetes Mother   . Cancer Father        prostate  . Diabetes Sister   . Colon cancer Neg Hx   . Esophageal cancer Neg Hx   . Rectal cancer Neg Hx   . Stomach cancer Neg Hx     Past Surgical History:  Procedure Laterality Date  . CESAREAN SECTION  1990    ROS: Review of Systems Negative except as stated above  PHYSICAL EXAM: BP 122/72   Pulse 77   Ht _0  (1.422 m)   Wt 170 lb (77.1 kg)   SpO2 97%   BMI 38.11 kg/m   Wt Readings from Last 3 Encounters:  10/08/20 170 lb (77.1 kg)  08/23/20 162 lb 6.4 oz (73.7 kg)  12/09/19 159 lb 9.6 oz (72.4 kg)    Physical Exam  General appearance - alert, well appearing, older African female and in no distress Mental status - normal mood, behavior, speech, dress, motor activity, and thought processes Eyes -mild subconjunctival hemorrhage left eye medially Neck - supple, no significant adenopathy Chest - clear to auscultation, no wheezes, rales or rhonchi, symmetric air entry Heart - normal rate, regular rhythm, normal S1, S2, no murmurs, rubs, clicks or  gallops Extremities -trace to 1+ bilateral lower extremity edema Diabetic Foot Exam - Simple   Simple Foot Form Visual Inspection See comments: Yes Sensation Testing See comments: Yes Pulse Check Posterior Tibialis and Dorsalis pulse intact bilaterally: Yes Comments    Lab Results  Component Value Date   HGBA1C 5.7 (H) 06/15/2020      CMP Latest Ref Rng & Units 06/15/2020 09/09/2019 03/03/2019  Glucose 65 - 99 mg/dL 104(H) 117(H) 92  BUN 8 - 27 mg/dL _1 Creatinine 0.57 - 1.00 mg/dL 0.78 0.82 1.02(H)  Sodium 134 - 144 mmol/L 139 139 142  Potassium 3.5 - 5.2 mmol/L 4.2 4.2 4.0  Chloride 96 - 106 mmol/L 101 101 103  CO2 20 - 29 mmol/L _0 Calcium 8.7 - 10.3 mg/dL 10.7(H) 10.9(H) 10.9(H)  Total Protein 6.0 - 8.5 g/dL 8.1 7.4 7.6  Total Bilirubin 0.0 - 1.2 mg/dL 0.7 0.7 0.5  Alkaline Phos 44 - 121 IU/L 67 65 69  AST 0 - 40 IU/L _1 ALT 0 - 32 IU/L _2 Lipid Panel     Component Value Date/Time   CHOL 146 06/15/2020 1042   TRIG 37 06/15/2020 1042   HDL 70 06/15/2020 1042   CHOLHDL 2.1 06/15/2020 1042   CHOLHDL 3.3 05/16/2016 0928   VLDL 21 05/16/2016 0928   LDLCALC 67 06/15/2020 1042    CBC    Component Value Date/Time   WBC 5.0 06/15/2020 1042   WBC 5.2 05/16/2016 0944   RBC 4.53 06/15/2020 1042   RBC 4.72 05/16/2016 0944   HGB 13.4 06/15/2020 1042   HCT 40.9 06/15/2020 1042   PLT 251 06/15/2020 1042   MCV 90 06/15/2020 1042   MCH 29.6 06/15/2020 1042   MCH 29.7 05/16/2016 0944   MCHC 32.8 06/15/2020 1042   MCHC 34.2 05/16/2016 0944   RDW 13.4 06/15/2020 1042   LYMPHSABS 2.0 11/26/2017 0913   MONOABS 0.8 10/06/2014 1049   EOSABS 0.1 11/26/2017 0913   BASOSABS 0.1 11/26/2017 0913    ASSESSMENT AND PLAN: 1. Type 2 diabetes mellitus with obesity (HCC) Continue Amaryl.  I will update her prescription to reflect 2 mg once a day.  Discussed and encourage healthy eating habits.  Encouraged her to cut back on portion sizes.  Encourage to  get in some form of moderate intensity exercise at least 3 to 4 days a week for 30 minutes. - Ambulatory referral to Ophthalmology  2. Essential hypertension At goal despite being off of hydrochlorothiazide.  However she has developed some lower extremity edema.  Advised her that I am not aware that hydrochlorothiazide has been recalled but patient still does not want to go back on the medication.  Continue nifedipine and low-salt diet.  3. Hyperlipidemia associated with type 2 diabetes mellitus ([place]) Continue atorvastatin  4. Cortical age-related cataract of both eyes - Ambulatory referral to Ophthalmology    Patient was given the opportunity to ask questions.  Patient verbalized understanding of the plan and was able to repeat key elements of the plan.   No orders of the defined types were placed in this encounter.    Requested Prescriptions    No prescriptions requested or ordered in this encounter    No follow-ups on file.  Karle Plumber, MD, FACP

## 2020-10-08 NOTE — Telephone Encounter (Signed)
Requested Prescriptions  Pending Prescriptions Disp Refills  . NIFEdipine (PROCARDIA-XL/NIFEDICAL-XL) 30 MG 24 hr tablet 30 tablet 3    Sig: TAKE 1 TABLET (30 MG TOTAL) BY MOUTH DAILY.     Cardiovascular:  Calcium Channel Blockers Passed - 10/08/2020  4:28 PM      Passed - Last BP in normal range    BP Readings from Last 1 Encounters:  10/08/20 122/72         Passed - Valid encounter within last 6 months    Recent Outpatient Visits          Today Type 2 diabetes mellitus with obesity [facility])   [place] Ladell Pier, MD   3 months ago Need for influenza vaccination   [place]ode[place], [name], RPH-CPP   4 months ago Type 2 diabetes mellitus with obesity [facility])   [place] Ladell Pier, MD   10 months ago Type 2 diabetes mellitus with obesity [facility])   [place] Ladell Pier, MD   1 year ago Primary hyperparathyroidism [facility])   [place], [name], NP

## 2020-10-08 NOTE — Progress Notes (Signed)
New referral to eye doctor. Gilipizide medication has changed to 1 tab daily.

## 2020-10-09 ENCOUNTER — Other Ambulatory Visit: Payer: Self-pay

## 2020-10-16 ENCOUNTER — Other Ambulatory Visit: Payer: Self-pay

## 2020-10-16 ENCOUNTER — Other Ambulatory Visit: Payer: Self-pay | Admitting: Ophthalmology

## 2020-10-16 MED FILL — Glimepiride Tab 2 MG: ORAL | 30 days supply | Qty: 15 | Fill #1 | Status: AC

## 2020-10-16 MED FILL — Atorvastatin Calcium Tab 40 MG (Base Equivalent): ORAL | 30 days supply | Qty: 30 | Fill #1 | Status: AC

## 2020-10-17 ENCOUNTER — Other Ambulatory Visit: Payer: Self-pay

## 2020-10-18 ENCOUNTER — Other Ambulatory Visit: Payer: Self-pay

## 2020-10-25 ENCOUNTER — Telehealth: Payer: Self-pay | Admitting: Internal Medicine

## 2020-10-25 NOTE — Telephone Encounter (Signed)
Pt would like provider to fill out the [place]. I informed pt that this paperwork takes at least 7 to 14 business days. Please follow up with pt regarding matter.

## 2020-10-25 NOTE — Telephone Encounter (Signed)
Went to [place] about form because it's for a request to stay in [facility] Medicaid Direct. Opal Sidles is going to reach out to [facility] about form to see if she has heard of it and Opal Sidles will let me know.

## 2020-10-31 DIAGNOSIS — Z419 Encounter for procedure for purposes other than remedying health state, unspecified: Secondary | ICD-10-CM | POA: Diagnosis not present

## 2020-11-05 ENCOUNTER — Telehealth: Payer: Self-pay

## 2020-11-05 NOTE — Telephone Encounter (Signed)
Following up on a request from the provider on clarification of the paperwork dropped off for the provider to sign. Verified the patients: name and date of birth. Case Manager inquired about the patient reason for the form. Patient shared she needs to have eye surgery to remove cataracts and was told this form would be able to her help her. After looking over the form, it looks to only assist with medicaid behavioral health services and [phone] (b)(3) services.

## 2020-11-27 ENCOUNTER — Other Ambulatory Visit: Payer: Self-pay

## 2020-11-27 MED FILL — Glimepiride Tab 2 MG: ORAL | 30 days supply | Qty: 15 | Fill #2 | Status: AC

## 2020-11-27 MED FILL — Atorvastatin Calcium Tab 40 MG (Base Equivalent): ORAL | 30 days supply | Qty: 30 | Fill #2 | Status: AC

## 2020-11-29 ENCOUNTER — Telehealth: Payer: No Typology Code available for payment source | Admitting: Internal Medicine

## 2020-11-29 ENCOUNTER — Other Ambulatory Visit: Payer: Self-pay

## 2020-11-29 NOTE — Telephone Encounter (Signed)
Patient came in looking for the paperwork she dropped off in my for  [employer]. Patient is wondering when and if this will be completed. Please follow up with Patient.

## 2020-11-30 DIAGNOSIS — Z419 Encounter for procedure for purposes other than remedying health state, unspecified: Secondary | ICD-10-CM | POA: Diagnosis not present

## 2020-12-10 ENCOUNTER — Telehealth: Payer: Self-pay

## 2020-12-10 NOTE — Telephone Encounter (Signed)
Following up on a request from the patient. Verified patients identification: name and date of birth. Patient shared they needed the [place] form filled out for eye surgery. CM shared this form does not assist with eye surgery or seeing a dermatologist. Patient shared that she understood and acknowledged where Dr. [name] put in a referral for dermatology. CM shared the patient needed to consult with an optometrist regarding her eye surgery.   Patient asked when the next appt was with her PCP, CM shared there wasn't one and schedule an appt while on the phone with the patient.   Patients paperwork  [employer] has been shredded.

## 2020-12-31 DIAGNOSIS — Z419 Encounter for procedure for purposes other than remedying health state, unspecified: Secondary | ICD-10-CM | POA: Diagnosis not present

## 2021-01-02 ENCOUNTER — Other Ambulatory Visit: Payer: Self-pay

## 2021-01-02 MED ORDER — NIFEDIPINE ER OSMOTIC RELEASE 30 MG PO TB24
30.0000 mg | ORAL_TABLET | Freq: Every day | ORAL | 2 refills | Status: DC
  Filled 2021-01-08: qty 30, 30d supply, fill #0
  Filled 2021-02-08: qty 30, 30d supply, fill #1
  Filled 2021-03-13: qty 30, 30d supply, fill #2

## 2021-01-08 ENCOUNTER — Other Ambulatory Visit: Payer: Self-pay

## 2021-01-08 ENCOUNTER — Other Ambulatory Visit: Payer: Self-pay | Admitting: Internal Medicine

## 2021-01-08 DIAGNOSIS — E1169 Type 2 diabetes mellitus with other specified complication: Secondary | ICD-10-CM

## 2021-01-08 DIAGNOSIS — E669 Obesity, unspecified: Secondary | ICD-10-CM

## 2021-01-08 MED ORDER — GLIMEPIRIDE 2 MG PO TABS
ORAL_TABLET | ORAL | 0 refills | Status: DC
  Filled 2021-01-08: qty 15, 30d supply, fill #0

## 2021-01-08 MED FILL — Atorvastatin Calcium Tab 40 MG (Base Equivalent): ORAL | 30 days supply | Qty: 30 | Fill #3 | Status: AC

## 2021-01-11 ENCOUNTER — Other Ambulatory Visit: Payer: Self-pay

## 2021-01-16 DIAGNOSIS — D485 Neoplasm of uncertain behavior of skin: Secondary | ICD-10-CM | POA: Diagnosis not present

## 2021-01-16 DIAGNOSIS — E7029 Other disorders of tyrosine metabolism: Secondary | ICD-10-CM | POA: Diagnosis not present

## 2021-01-16 DIAGNOSIS — L819 Disorder of pigmentation, unspecified: Secondary | ICD-10-CM | POA: Diagnosis not present

## 2021-01-23 DIAGNOSIS — E7029 Other disorders of tyrosine metabolism: Secondary | ICD-10-CM | POA: Diagnosis not present

## 2021-01-31 DIAGNOSIS — Z419 Encounter for procedure for purposes other than remedying health state, unspecified: Secondary | ICD-10-CM | POA: Diagnosis not present

## 2021-02-08 ENCOUNTER — Other Ambulatory Visit: Payer: Self-pay

## 2021-02-08 ENCOUNTER — Ambulatory Visit: Payer: Medicaid Other | Attending: Internal Medicine | Admitting: Internal Medicine

## 2021-02-08 ENCOUNTER — Encounter: Payer: Self-pay | Admitting: Internal Medicine

## 2021-02-08 VITALS — BP 131/84 | HR 63 | Resp 16 | Wt 164.6 lb

## 2021-02-08 DIAGNOSIS — Z8249 Family history of ischemic heart disease and other diseases of the circulatory system: Secondary | ICD-10-CM | POA: Insufficient documentation

## 2021-02-08 DIAGNOSIS — E11649 Type 2 diabetes mellitus with hypoglycemia without coma: Secondary | ICD-10-CM | POA: Diagnosis not present

## 2021-02-08 DIAGNOSIS — Z79899 Other long term (current) drug therapy: Secondary | ICD-10-CM | POA: Diagnosis not present

## 2021-02-08 DIAGNOSIS — E210 Primary hyperparathyroidism: Secondary | ICD-10-CM | POA: Insufficient documentation

## 2021-02-08 DIAGNOSIS — Z23 Encounter for immunization: Secondary | ICD-10-CM | POA: Diagnosis not present

## 2021-02-08 DIAGNOSIS — M85851 Other specified disorders of bone density and structure, right thigh: Secondary | ICD-10-CM | POA: Insufficient documentation

## 2021-02-08 DIAGNOSIS — E785 Hyperlipidemia, unspecified: Secondary | ICD-10-CM | POA: Insufficient documentation

## 2021-02-08 DIAGNOSIS — E669 Obesity, unspecified: Secondary | ICD-10-CM | POA: Diagnosis not present

## 2021-02-08 DIAGNOSIS — Z833 Family history of diabetes mellitus: Secondary | ICD-10-CM | POA: Insufficient documentation

## 2021-02-08 DIAGNOSIS — I10 Essential (primary) hypertension: Secondary | ICD-10-CM | POA: Diagnosis not present

## 2021-02-08 DIAGNOSIS — Z6836 Body mass index (BMI) 36.0-36.9, adult: Secondary | ICD-10-CM | POA: Insufficient documentation

## 2021-02-08 DIAGNOSIS — E1169 Type 2 diabetes mellitus with other specified complication: Secondary | ICD-10-CM | POA: Diagnosis not present

## 2021-02-08 LAB — GLUCOSE, POCT (MANUAL RESULT ENTRY)
POC Glucose: 117 mg/dl — AB (ref 70–99)
POC Glucose: 66 mg/dl — AB (ref 70–99)

## 2021-02-08 LAB — POCT GLYCOSYLATED HEMOGLOBIN (HGB A1C): HbA1c, POC (controlled diabetic range): 5.3 % (ref 0.0–7.0)

## 2021-02-08 MED FILL — Atorvastatin Calcium Tab 40 MG (Base Equivalent): ORAL | 30 days supply | Qty: 30 | Fill #4 | Status: AC

## 2021-02-08 NOTE — Progress Notes (Signed)
Patient ID: [name], female    DOB: September 07, 1950  MRN: [id number]  CC: Follow-up   Subjective: [name] is a 70 y.o. female who presents for chronic ds management Her concerns today include:  Patient with history of DM, HTN, HL, OA, hyperPTH, osteopenia.Marland Kitchen   DIABETES TYPE 2 Last A1C:   Results for orders placed or performed in visit on 02/08/21  POCT glucose (manual entry)  Result Value Ref Range   POC Glucose 66 (A) 70 - 99 mg/dl  POCT glycosylated hemoglobin (Hb A1C)  Result Value Ref Range   Hemoglobin A1C     HbA1c POC (<> result, manual entry)     HbA1c, POC (prediabetic range)     HbA1c, POC (controlled diabetic range) 5.3 0.0 - 7.0 %  POCT glucose (manual entry)  Result Value Ref Range   POC Glucose 117 (A) 70 - 99 mg/dl    Med Adherence:  _0  Yes -on Amaryl 1m 1/2 tab daily   _1  No Medication side effects:  _2  Yes    _3  No Home Monitoring?  _4  Yes  before BF  _5  No Home glucose results range: 90-131.  Lowest 84.  BS low in office at 66.  Pt asymptomatic.  Reports she did not eat lunch today and she normally skips lunch. Diet Adherence: _6  Yes    _7  No Exercise: _8  Yes    _9  No Hypoglycemic episodes?: _10  Yes    _11  No Numbness of the feet? _12  Yes    _13  No Retinopathy hx? _14  Yes    _15  No Last eye exam: saw eye Dr. LMaryjane Hurtermth.  Still needs cataract extraction BL. [facility] Comments:   HYPERTENSION Currently taking: see medication list.  She is on nifedipine. Med Adherence: _16  Yes    _17  No Medication side effects: _18  Yes    _19  No Adherence with salt restriction: _20  Yes    _21  No Home Monitoring?: _22  Yes    _23  No Monitoring Frequency: daily Home BP results range: 120s/70-80 SOB? _24  Yes    _25  No Chest Pain?: _26  Yes    _27  No Leg swelling?: _28  Yes    _29  No Headaches?: _30  Yes    _31  No Dizziness? _32  Yes    _33  No Comments:   HL: tolerating Lipitor Patient Active Problem List   Diagnosis Date Noted   Osteopenia of neck of  right femur 12/09/2019   History of cataract 12/09/2019   Screening breast examination 07/07/2019   Primary [place]parathyroidism ([place] 04/24/2019   Adenomatous polyp of colon 07/29/2018   Scarring of skin 07/16/2017   Breast calcification, left 07/16/2017   Immunization due 04/03/2017   Hyperlipidemia associated with type 2 diabetes mellitus ([place] 05/18/2016   Primary osteoarthritis involving multiple joints 05/16/2016   Diabetes type 2, controlled ([place] 03/13/2015   Ptosis, both eyelids 03/13/2015   Bilateral cataracts 03/13/2015   Hypertension 10/06/2014     Current Outpatient Medications on File Prior to Visit  Medication Sig Dispense Refill   atorvastatin (LIPITOR) 40 MG tablet TAKE 1 TABLET (40 MG TOTAL) BY MOUTH DAILY. 30 tablet 11   Blood Glucose Monitoring Suppl (TRUE METRIX METER) W/DEVICE KIT 1 each by Does not apply route as needed. 1 kit 0   Cholecalciferol (VITAMIN D3) 10 MCG (400 UNIT) tablet Take 1 tablet (400 Units total) by mouth daily. 100 tablet 1   Glucosamine-Chondroitin 750-600 MG TABS Take 2 tablets by mouth daily.  0   glucose  blood (TRUE METRIX BLOOD GLUCOSE TEST) test strip 1 each by Other route daily. 100 each 3   ibuprofen (ADVIL,MOTRIN) 400 MG tablet Take 400 mg by mouth every 6 (six) hours as needed.     NIFEdipine (PROCARDIA-XL/NIFEDICAL-XL) 30 MG 24 hr tablet Take 1 tablet (30 mg total) by mouth daily. 30 tablet 2   PROLENSA 0.07 % SOLN INSTILL 1 DROP IN LEFT EYE DAILY; START 5 DAYS PRIOR TO SURGERY AND CONTNUE UNTIL GONE 3 mL 0   TRUEPLUS LANCETS 28G MISC 1 each by Does not apply route daily. 100 each 3   Turmeric 500 MG CAPS Take 500 mg by mouth daily.     Current Facility-Administered Medications on File Prior to Visit  Medication Dose Route Frequency Provider Last Rate Last Admin   0.9 %  sodium chloride infusion  500 mL Intravenous Once Thornton Park, MD        No Known Allergies  Social History   Socioeconomic History   Marital status:  Married    Spouse name: Not on file   Number of children: 3   Years of education: 12    Highest education level: High school graduate  Occupational History   Not on file  Tobacco Use   Smoking status: Never   Smokeless tobacco: Never  Vaping Use   Vaping Use: Never used  Substance and Sexual Activity   Alcohol use: Not Currently    Comment: rarely   Drug use: No   Sexual activity: Not Currently    Birth control/protection: Post-menopausal  Other Topics Concern   Not on file  Social History Narrative   From [place]   Moved to [place] on 09/11/2014      Has 3 children- 1 daughter in [place],    1 child in [place]   Social Determinants of Health   Financial Resource Strain: Not on file  Food Insecurity: Not on file  Transportation Needs: Unmet Transportation Needs   Lack of Transportation (Medical): Yes   Lack of Transportation (Non-Medical): Yes  Physical Activity: Not on file  Stress: Not on file  Social Connections: Not on file  Intimate Partner Violence: Not on file    Family History  Problem Relation Age of Onset   Hypertension Mother    Diabetes Mother    Cancer Father        prostate   Diabetes Sister    Colon cancer Neg Hx    Esophageal cancer Neg Hx    Rectal cancer Neg Hx    Stomach cancer Neg Hx     Past Surgical History:  Procedure Laterality Date   CESAREAN SECTION  1990   PTERYGIUM EXCISION Left 09/01/2020    ROS: Review of Systems Negative except as stated above  PHYSICAL EXAM: BP 131/84   Pulse 63   Resp 16   Wt 164 lb 9.6 oz (74.7 kg)   SpO2 97%   BMI 36.90 kg/m   Physical Exam  General appearance - alert, well appearing, and in no distress Mental status - normal mood, behavior, speech, dress, motor activity, and thought processes Neck - supple, no significant adenopathy Chest - clear to auscultation, no wheezes, rales or rhonchi, symmetric air entry Heart - normal rate, regular rhythm, normal S1, S2, no murmurs, rubs, clicks or  gallops Extremities - peripheral pulses normal.  Trace lower extremity edema.   CMP Latest Ref Rng & Units 06/15/2020 09/09/2019 03/03/2019  Glucose 65 - 99 mg/dL 104(H) 117(H) 92  BUN 8 -  27 mg/dL _0 Creatinine 0.57 - 1.00 mg/dL 0.78 0.82 1.02(H)  Sodium 134 - 144 mmol/L 139 139 142  Potassium 3.5 - 5.2 mmol/L 4.2 4.2 4.0  Chloride 96 - 106 mmol/L 101 101 103  CO2 20 - 29 mmol/L _1 Calcium 8.7 - 10.3 mg/dL 10.7(H) 10.9(H) 10.9(H)  Total Protein 6.0 - 8.5 g/dL 8.1 7.4 7.6  Total Bilirubin 0.0 - 1.2 mg/dL 0.7 0.7 0.5  Alkaline Phos 44 - 121 IU/L 67 65 69  AST 0 - 40 IU/L _2 ALT 0 - 32 IU/L _3 Lipid Panel     Component Value Date/Time   CHOL 146 06/15/2020 1042   TRIG 37 06/15/2020 1042   HDL 70 06/15/2020 1042   CHOLHDL 2.1 06/15/2020 1042   CHOLHDL 3.3 05/16/2016 0928   VLDL 21 05/16/2016 0928   LDLCALC 67 06/15/2020 1042    CBC    Component Value Date/Time   WBC 5.0 06/15/2020 1042   WBC 5.2 05/16/2016 0944   RBC 4.53 06/15/2020 1042   RBC 4.72 05/16/2016 0944   HGB 13.4 06/15/2020 1042   HCT 40.9 06/15/2020 1042   PLT 251 06/15/2020 1042   MCV 90 06/15/2020 1042   MCH 29.6 06/15/2020 1042   MCH 29.7 05/16/2016 0944   MCHC 32.8 06/15/2020 1042   MCHC 34.2 05/16/2016 0944   RDW 13.4 06/15/2020 1042   LYMPHSABS 2.0 11/26/2017 0913   MONOABS 0.8 10/06/2014 1049   EOSABS 0.1 11/26/2017 0913   BASOSABS 0.1 11/26/2017 0913    ASSESSMENT AND PLAN:  1. Type 2 diabetes mellitus with obesity ([place]) 2. Hypoglycemia associated with diabetes ([place]) -Given the low blood sugars today and patient being asymptomatic and the fact that her A1c is well below 6, I recommend stopping the Amaryl.  Advised patient to continue monitoring her blood sugars let me know if blood sugars before meals start increasing above 130. -Patient given 4 glucose tablets while here in the office today to get her blood sugar back up. - POCT glucose (manual entry) - POCT  glycosylated hemoglobin (Hb A1C)   3. Essential hypertension Reported home blood pressure readings are at goal.  Continue nifedipine and low-salt diet.  4. Hyperlipidemia associated with type 2 diabetes mellitus ([place]) Continue atorvastatin.  5. Need for influenza vaccination Flu shot given today.   Patient was given the opportunity to ask questions.  Patient verbalized understanding of the plan and was able to repeat key elements of the plan.   Orders Placed This Encounter  Procedures   Flu Vaccine QUAD 66moIM (Fluarix, Fluzone & Alfiuria Quad PF)   POCT glucose (manual entry)   POCT glycosylated hemoglobin (Hb A1C)   POCT glucose (manual entry)     Requested Prescriptions    No prescriptions requested or ordered in this encounter    Return in about 4 months (around 06/10/2021).  DKarle Plumber MD, FACP

## 2021-02-08 NOTE — Patient Instructions (Addendum)
Stop glimepiride. Try to avoid skipping meals.

## 2021-02-22 ENCOUNTER — Other Ambulatory Visit: Payer: Self-pay

## 2021-02-22 MED ORDER — PREDNISOLONE ACETATE 1 % OP SUSP
OPHTHALMIC | 0 refills | Status: DC
  Filled 2021-02-22: qty 10, 53d supply, fill #0

## 2021-03-02 DIAGNOSIS — Z419 Encounter for procedure for purposes other than remedying health state, unspecified: Secondary | ICD-10-CM | POA: Diagnosis not present

## 2021-03-13 ENCOUNTER — Other Ambulatory Visit: Payer: Self-pay

## 2021-03-13 MED FILL — Atorvastatin Calcium Tab 40 MG (Base Equivalent): ORAL | 30 days supply | Qty: 30 | Fill #5 | Status: AC

## 2021-03-14 ENCOUNTER — Other Ambulatory Visit: Payer: Self-pay

## 2021-03-29 ENCOUNTER — Other Ambulatory Visit (HOSPITAL_BASED_OUTPATIENT_CLINIC_OR_DEPARTMENT_OTHER): Payer: Self-pay

## 2021-03-29 DIAGNOSIS — H11061 Recurrent pterygium of right eye: Secondary | ICD-10-CM | POA: Diagnosis not present

## 2021-03-29 DIAGNOSIS — H18713 Corneal ectasia, bilateral: Secondary | ICD-10-CM | POA: Diagnosis not present

## 2021-03-29 MED ORDER — PREDNISOLONE ACETATE 1 % OP SUSP
OPHTHALMIC | 5 refills | Status: DC
  Filled 2021-04-02: qty 10, 34d supply, fill #0
  Filled 2021-06-17: qty 10, 34d supply, fill #1
  Filled 2021-07-23: qty 10, 34d supply, fill #2
  Filled 2021-09-19: qty 10, 34d supply, fill #3
  Filled 2021-10-30: qty 10, 34d supply, fill #4
  Filled 2022-01-03: qty 10, 34d supply, fill #5

## 2021-04-02 ENCOUNTER — Other Ambulatory Visit: Payer: Self-pay

## 2021-04-02 DIAGNOSIS — Z419 Encounter for procedure for purposes other than remedying health state, unspecified: Secondary | ICD-10-CM | POA: Diagnosis not present

## 2021-04-03 ENCOUNTER — Other Ambulatory Visit: Payer: Self-pay

## 2021-04-04 ENCOUNTER — Other Ambulatory Visit: Payer: Self-pay

## 2021-04-11 ENCOUNTER — Other Ambulatory Visit: Payer: Self-pay | Admitting: Internal Medicine

## 2021-04-11 ENCOUNTER — Other Ambulatory Visit: Payer: Self-pay

## 2021-04-11 MED ORDER — NIFEDIPINE ER OSMOTIC RELEASE 30 MG PO TB24
30.0000 mg | ORAL_TABLET | Freq: Every day | ORAL | 0 refills | Status: DC
  Filled 2021-04-11: qty 30, 30d supply, fill #0

## 2021-04-11 MED FILL — Atorvastatin Calcium Tab 40 MG (Base Equivalent): ORAL | 30 days supply | Qty: 30 | Fill #6 | Status: AC

## 2021-04-11 NOTE — Telephone Encounter (Signed)
Requested Prescriptions  Pending Prescriptions Disp Refills  . NIFEdipine (PROCARDIA-XL/NIFEDICAL-XL) 30 MG 24 hr tablet 30 tablet 0    Sig: Take 1 tablet (30 mg total) by mouth daily.     Cardiovascular:  Calcium Channel Blockers Passed - 04/11/2021 12:03 PM      Passed - Last BP in normal range    BP Readings from Last 1 Encounters:  02/08/21 131/84         Passed - Valid encounter within last 6 months    Recent Outpatient Visits          2 months ago Type 2 diabetes mellitus with obesity ([place])   [place] Karle Plumber B, MD   6 months ago Type 2 diabetes mellitus with obesity [facility])   [place], MD   10 months ago Need for influenza vaccination   [place], Annie Main L, RPH-CPP   10 months ago Type 2 diabetes mellitus with obesity [facility])   [place] [facility] Ladell Pier, MD   1 year ago Type 2 diabetes mellitus with obesity [facility])   [place] [facility] Ladell Pier, MD

## 2021-04-12 ENCOUNTER — Other Ambulatory Visit: Payer: Self-pay

## 2021-05-02 DIAGNOSIS — Z419 Encounter for procedure for purposes other than remedying health state, unspecified: Secondary | ICD-10-CM | POA: Diagnosis not present

## 2021-06-02 DIAGNOSIS — Z419 Encounter for procedure for purposes other than remedying health state, unspecified: Secondary | ICD-10-CM | POA: Diagnosis not present

## 2021-06-13 ENCOUNTER — Other Ambulatory Visit: Payer: Self-pay | Admitting: Internal Medicine

## 2021-06-13 ENCOUNTER — Other Ambulatory Visit: Payer: Self-pay

## 2021-06-13 MED ORDER — NIFEDIPINE ER OSMOTIC RELEASE 30 MG PO TB24
30.0000 mg | ORAL_TABLET | Freq: Every day | ORAL | 0 refills | Status: DC
  Filled 2021-06-13 – 2021-06-20 (×2): qty 30, 30d supply, fill #0

## 2021-06-13 MED FILL — Atorvastatin Calcium Tab 40 MG (Base Equivalent): ORAL | 30 days supply | Qty: 30 | Fill #0 | Status: CN

## 2021-06-14 ENCOUNTER — Other Ambulatory Visit: Payer: Self-pay

## 2021-06-17 ENCOUNTER — Other Ambulatory Visit: Payer: Self-pay

## 2021-06-20 ENCOUNTER — Other Ambulatory Visit: Payer: Self-pay

## 2021-06-20 MED FILL — Atorvastatin Calcium Tab 40 MG (Base Equivalent): ORAL | 30 days supply | Qty: 30 | Fill #0 | Status: AC

## 2021-07-03 DIAGNOSIS — Z419 Encounter for procedure for purposes other than remedying health state, unspecified: Secondary | ICD-10-CM | POA: Diagnosis not present

## 2021-07-23 ENCOUNTER — Other Ambulatory Visit: Payer: Self-pay | Admitting: Internal Medicine

## 2021-07-23 ENCOUNTER — Other Ambulatory Visit: Payer: Self-pay

## 2021-07-23 DIAGNOSIS — E1169 Type 2 diabetes mellitus with other specified complication: Secondary | ICD-10-CM

## 2021-07-24 ENCOUNTER — Telehealth: Payer: Self-pay | Admitting: *Deleted

## 2021-07-24 ENCOUNTER — Other Ambulatory Visit: Payer: Self-pay

## 2021-07-24 MED ORDER — ATORVASTATIN CALCIUM 40 MG PO TABS
ORAL_TABLET | Freq: Every day | ORAL | 3 refills | Status: DC
  Filled 2021-07-24: qty 30, 30d supply, fill #0
  Filled 2021-08-29: qty 30, 30d supply, fill #1
  Filled 2021-10-04: qty 30, 30d supply, fill #2
  Filled 2021-10-30: qty 30, 30d supply, fill #3

## 2021-07-24 MED ORDER — NIFEDIPINE ER OSMOTIC RELEASE 30 MG PO TB24
30.0000 mg | ORAL_TABLET | Freq: Every day | ORAL | 3 refills | Status: DC
  Filled 2021-07-24: qty 30, 30d supply, fill #0
  Filled 2021-08-29: qty 30, 30d supply, fill #1
  Filled 2021-10-04: qty 30, 30d supply, fill #2
  Filled 2021-10-30: qty 30, 30d supply, fill #3

## 2021-07-24 NOTE — Telephone Encounter (Signed)
I called pt and made her an appt for 10/04/2021 with Dr. [name].  Requested Prescriptions  Pending Prescriptions Disp Refills   atorvastatin (LIPITOR) 40 MG tablet 30 tablet 3    Sig: TAKE 1 TABLET (40 MG TOTAL) BY MOUTH DAILY.     Cardiovascular:  Antilipid - Statins Failed - 07/23/2021  1:54 PM      Failed - Lipid Panel in normal range within the last 12 months    Cholesterol, Total  Date Value Ref Range Status  06/15/2020 146 100 - 199 mg/dL Final   LDL Chol Calc (NIH)  Date Value Ref Range Status  06/15/2020 67 0 - 99 mg/dL Final   HDL  Date Value Ref Range Status  06/15/2020 70 >39 mg/dL Final   Triglycerides  Date Value Ref Range Status  06/15/2020 37 0 - 149 mg/dL Final         Passed - Patient is not pregnant      Passed - Valid encounter within last 12 months    Recent Outpatient Visits          5 months ago Type 2 diabetes mellitus with obesity ([place])   [place] Karle Plumber B, MD   9 months ago Type 2 diabetes mellitus with obesity [facility])   [place] Ladell Pier, MD   1 year ago Need for influenza vaccination   [place], Stephen L, RPH-CPP   1 year ago Type 2 diabetes mellitus with obesity [facility])   [place] Ladell Pier, MD   1 year ago Type 2 diabetes mellitus with obesity [facility])   [place] Ladell Pier, MD      Future Appointments            In 2 months Ladell Pier, MD [place]            NIFEdipine (PROCARDIA-XL/NIFEDICAL-XL) 30 MG 24 hr tablet 30 tablet 3    Sig: Take 1 tablet (30 mg total) by mouth daily.     Cardiovascular: Calcium Channel Blockers 2 Passed - 07/23/2021  1:54 PM      Passed - Last BP in normal range    BP Readings from Last 1 Encounters:  02/08/21 131/84         Passed - Last Heart Rate in  normal range    Pulse Readings from Last 1 Encounters:  02/08/21 63         Passed - Valid encounter within last 6 months    Recent Outpatient Visits          5 months ago Type 2 diabetes mellitus with obesity ([place])   [place] Karle Plumber B, MD   9 months ago Type 2 diabetes mellitus with obesity [facility])   [place] Ladell Pier, MD   1 year ago Need for influenza vaccination   [place], Stephen L, RPH-CPP   1 year ago Type 2 diabetes mellitus with obesity [facility] [place] [facility])   [place] Ladell Pier, MD   1 year ago Type 2 diabetes mellitus with [facility])   [place], MD      Future Appointments  In 2 months Ladell Pier, MD [place]

## 2021-07-24 NOTE — Telephone Encounter (Signed)
I called her and made an appt for her 6 mo. Check/refills with Dr. [name] for 10/04/2021 at 3:50.   I gave her a 90 day courtesy refill on her prescriptions.

## 2021-07-25 ENCOUNTER — Other Ambulatory Visit: Payer: Self-pay

## 2021-07-31 DIAGNOSIS — Z419 Encounter for procedure for purposes other than remedying health state, unspecified: Secondary | ICD-10-CM | POA: Diagnosis not present

## 2021-08-29 ENCOUNTER — Other Ambulatory Visit: Payer: Self-pay

## 2021-08-31 DIAGNOSIS — Z419 Encounter for procedure for purposes other than remedying health state, unspecified: Secondary | ICD-10-CM | POA: Diagnosis not present

## 2021-09-12 ENCOUNTER — Ambulatory Visit: Admission: RE | Admit: 2021-09-12 | Payer: Medicaid Other | Source: Ambulatory Visit

## 2021-09-19 ENCOUNTER — Other Ambulatory Visit: Payer: Self-pay

## 2021-09-26 ENCOUNTER — Ambulatory Visit
Admission: RE | Admit: 2021-09-26 | Discharge: 2021-09-26 | Disposition: A | Discharge status: Home / Self Care | Payer: Medicaid Other | Source: Ambulatory Visit | Attending: Obstetrics and Gynecology | Admitting: Obstetrics and Gynecology

## 2021-09-26 DIAGNOSIS — Z1231 Encounter for screening mammogram for malignant neoplasm of breast: Secondary | ICD-10-CM | POA: Diagnosis not present

## 2021-09-30 DIAGNOSIS — Z419 Encounter for procedure for purposes other than remedying health state, unspecified: Secondary | ICD-10-CM | POA: Diagnosis not present

## 2021-10-04 ENCOUNTER — Ambulatory Visit: Payer: Medicaid Other | Attending: Internal Medicine | Admitting: Internal Medicine

## 2021-10-04 ENCOUNTER — Other Ambulatory Visit: Payer: Self-pay

## 2021-10-04 ENCOUNTER — Encounter: Payer: Self-pay | Admitting: Internal Medicine

## 2021-10-04 VITALS — BP 144/80 | HR 72 | Ht <= 58 in | Wt 161.0 lb

## 2021-10-04 DIAGNOSIS — E119 Type 2 diabetes mellitus without complications: Secondary | ICD-10-CM

## 2021-10-04 DIAGNOSIS — E669 Obesity, unspecified: Secondary | ICD-10-CM

## 2021-10-04 DIAGNOSIS — E785 Hyperlipidemia, unspecified: Secondary | ICD-10-CM

## 2021-10-04 DIAGNOSIS — I10 Essential (primary) hypertension: Secondary | ICD-10-CM | POA: Diagnosis not present

## 2021-10-04 DIAGNOSIS — E1169 Type 2 diabetes mellitus with other specified complication: Secondary | ICD-10-CM | POA: Diagnosis not present

## 2021-10-04 DIAGNOSIS — E210 Primary hyperparathyroidism: Secondary | ICD-10-CM

## 2021-10-04 LAB — POCT GLYCOSYLATED HEMOGLOBIN (HGB A1C): HbA1c, POC (controlled diabetic range): 6 % (ref 0.0–7.0)

## 2021-10-04 MED ORDER — HYDROCHLOROTHIAZIDE 12.5 MG PO TABS
ORAL_TABLET | ORAL | 3 refills | Status: DC
  Filled 2021-10-04: qty 90, 90d supply, fill #0
  Filled 2022-03-10: qty 36, 84d supply, fill #1
  Filled 2022-08-01: qty 36, 84d supply, fill #2

## 2021-10-04 NOTE — Progress Notes (Signed)
? ? ?Patient ID: [name], female    DOB: 1950/06/18  MRN: [id number] ? ?CC: Diabetes ? ? ?Subjective: ?[name] is a 71 y.o. female who presents for chronic ds management ?Her concerns today include:  ?Patient with history of DM, HTN, HL, OA, hyperPTH, osteopenia.. ? ?DM: ?Results for orders placed or performed in visit on 10/04/21  ?POCT glycosylated hemoglobin (Hb A1C)  ?Result Value Ref Range  ? Hemoglobin A1C    ? HbA1c POC (<> result, manual entry)    ? HbA1c, POC (prediabetic range)    ? HbA1c, POC (controlled diabetic range) 6.0 0.0 - 7.0 %  ?Diet control.  She checks her blood sugars 2x/wk and reports levels have been good.she reports healthy eating habits.  Her weight is down 9 lbs for the past 1 yr. ?Does a lot of walking ?Due for eye exam.  Requests referral to [facility]. ? ?HTN:  taking Nifedipine as prescribed and limits salt in foods ?Checks BP a few times a wk.  Gives range 128-130s/75-84 ?No CP/SOB/HA/dizziness.  Reports LE edema in legs and ankles ? ?HL:  taking Lipitor and tolerating ? ?Hyperparathyroidism: She is due for another DEXA scan.  Her last one was 2 years ago.  She has not had any kidney stones. ? ?HM: She declines shingles vaccine ?Patient Active Problem List  ? Diagnosis Date Noted  ? Osteopenia of neck of right femur 12/09/2019  ? History of cataract 12/09/2019  ? Screening breast examination 07/07/2019  ? Primary hyperparathyroidism ([place]) 04/24/2019  ? Adenomatous polyp of colon 07/29/2018  ? Scarring of skin 07/16/2017  ? Breast calcification, left 07/16/2017  ? Immunization due 04/03/2017  ? Hyperlipidemia associated with type 2 diabetes mellitus ([place]) 05/18/2016  ? Primary osteoarthritis involving multiple joints 05/16/2016  ? Diabetes type 2, controlled ([place]) 03/13/2015  ? Ptosis, both eyelids 03/13/2015  ? Bilateral cataracts 03/13/2015  ? Hypertension 10/06/2014  ?  ? ?Current Outpatient Medications on File Prior to Visit  ?Medication Sig  Dispense Refill  ? atorvastatin (LIPITOR) 40 MG tablet TAKE 1 TABLET (40 MG TOTAL) BY MOUTH DAILY. 30 tablet 3  ? Blood Glucose Monitoring Suppl (TRUE METRIX METER) W/DEVICE KIT 1 each by Does not apply route as needed. 1 kit 0  ? Cholecalciferol (VITAMIN D3) 10 MCG (400 UNIT) tablet Take 1 tablet (400 Units total) by mouth daily. 100 tablet 1  ? Glucosamine-Chondroitin 750-600 MG TABS Take 2 tablets by mouth daily.  0  ? glucose blood (TRUE METRIX BLOOD GLUCOSE TEST) test strip 1 each by Other route daily. 100 each 3  ? ibuprofen (ADVIL,MOTRIN) 400 MG tablet Take 400 mg by mouth every 6 (six) hours as needed.    ? NIFEdipine (PROCARDIA-XL/NIFEDICAL-XL) 30 MG 24 hr tablet Take 1 tablet (30 mg total) by mouth daily. 30 tablet 3  ? prednisoLONE acetate (PRED FORTE) 1 % ophthalmic suspension instill 1 drop by ophthalmic route 3 times daily into the RIGHT eye 10 mL 5  ? TRUEPLUS LANCETS 28G MISC 1 each by Does not apply route daily. 100 each 3  ? Turmeric 500 MG CAPS Take 500 mg by mouth daily.    ? ?Current Facility-Administered Medications on File Prior to Visit  ?Medication Dose Route Frequency Provider Last Rate Last Admin  ? 0.9 %  sodium chloride infusion  500 mL Intravenous Once Thornton Park, MD      ? ? ?No Known Allergies ? ?Social History  ? ?Socioeconomic History  ? Marital  status: Married  ?  Spouse name: Not on file  ? Number of children: 3  ? Years of education: 60   ? Highest education level: High school graduate  ?Occupational History  ? Not on file  ?Tobacco Use  ? Smoking status: Never  ? Smokeless tobacco: Never  ?Vaping Use  ? Vaping Use: Never used  ?Substance and Sexual Activity  ? Alcohol use: Not Currently  ?  Comment: rarely  ? Drug use: No  ? Sexual activity: Not Currently  ?  Birth control/protection: Post-menopausal  ?Other Topics Concern  ? Not on file  ?Social History Narrative  ? From [place]  ? Moved to [place] on 09/11/2014  ?   ? Has 3 children- 1 daughter in [place],   ? 1 child in [place]   ? ?Social Determinants of Health  ? ?Financial Resource Strain: Not on file  ?Food Insecurity: Not on file  ?Transportation Needs: Not on file  ?Physical Activity: Not on file  ?Stress: Not on file  ?Social Connections: Not on file  ?Intimate Partner Violence: Not on file  ? ? ?Family History  ?Problem Relation Age of Onset  ? Hypertension Mother   ? Diabetes Mother   ? Cancer Father   ?     prostate  ? Diabetes Sister   ? Colon cancer Neg Hx   ? Esophageal cancer Neg Hx   ? Rectal cancer Neg Hx   ? Stomach cancer Neg Hx   ? Breast cancer Neg Hx   ? ? ?Past Surgical History:  ?Procedure Laterality Date  ? [place]  ? PTERYGIUM EXCISION Left 09/01/2020  ? ? ?ROS: ?Review of Systems ?Negative except as stated above ? ?PHYSICAL EXAM: ?BP (!) 144/80   Pulse 72   Ht _0  (1.422 m)   Wt 161 lb (73 kg)   SpO2 100%   BMI 36.10 kg/m?   ?Wt Readings from Last 3 Encounters:  ?10/04/21 161 lb (73 kg)  ?02/08/21 164 lb 9.6 oz (74.7 kg)  ?10/08/20 170 lb (77.1 kg)  ? ? ?Physical Exam ? ?General appearance - alert, well appearing, older African female and in no distress ?Mental status - normal mood, behavior, speech, dress, motor activity, and thought processes ?Mouth - mucous membranes moist, pharynx normal without lesions ?Chest - clear to auscultation, no wheezes, rales or rhonchi, symmetric air entry ?Heart - normal rate, regular rhythm, normal S1, S2, no murmurs, rubs, clicks or gallops ?Extremities -she has trace to 1+ edema in both lower extremities from mid shin down and includes the ankles. ? ? ? ?  Latest Ref Rng & Units 06/15/2020  ? 10:42 AM 09/09/2019  ? 10:15 AM 03/03/2019  ?  4:44 PM  ?CMP  ?Glucose 65 - 99 mg/dL 104   117   92    ?BUN 8 - 27 mg/dL _1 ?Creatinine 0.57 - 1.00 mg/dL 0.78   0.82   1.02    ?Sodium 134 - 144 mmol/L 139   139   142    ?Potassium 3.5 - 5.2 mmol/L 4.2   4.2   4.0    ?Chloride 96 - 106 mmol/L 101   101   103    ?CO2 20 - 29 mmol/L _2 ?Calcium 8.7 -  10.3 mg/dL 10.7   10.9   10.9    ?Total Protein 6.0 - 8.5 g/dL  8.1   7.4   7.6    ?Total Bilirubin 0.0 - 1.2 mg/dL 0.7   0.7   0.5    ?Alkaline Phos 44 - 121 IU/L 67   65   69    ?AST 0 - 40 IU/L _0 ?ALT 0 - 32 IU/L _1 ? ?Lipid Panel  ?   ?Component Value Date/Time  ? CHOL 146 06/15/2020 1042  ? TRIG 37 06/15/2020 1042  ? HDL 70 06/15/2020 1042  ? CHOLHDL 2.1 06/15/2020 1042  ? CHOLHDL 3.3 05/16/2016 0928  ? VLDL 21 05/16/2016 0928  ? [employer] 67 06/15/2020 1042  ? ? ?CBC ?   ?Component Value Date/Time  ? WBC 5.0 06/15/2020 1042  ? WBC 5.2 05/16/2016 0944  ? RBC 4.53 06/15/2020 1042  ? RBC 4.72 05/16/2016 0944  ? HGB 13.4 06/15/2020 1042  ? HCT 40.9 06/15/2020 1042  ? PLT 251 06/15/2020 1042  ? MCV 90 06/15/2020 1042  ? MCH 29.6 06/15/2020 1042  ? MCH 29.7 05/16/2016 0944  ? MCHC 32.8 06/15/2020 1042  ? MCHC 34.2 05/16/2016 0944  ? RDW 13.4 06/15/2020 1042  ? LYMPHSABS 2.0 11/26/2017 0913  ? MONOABS 0.8 10/06/2014 1049  ? EOSABS 0.1 11/26/2017 0913  ? BASOSABS 0.1 11/26/2017 0913  ? ? ?ASSESSMENT AND PLAN: ?1. Type 2 diabetes mellitus with obesity ([place]) ?Commended her on weight loss.  Encouraged her to continue healthy eating habits and move as much as she can.  We will continue to observe for diabetes off medication. ?- POCT glycosylated hemoglobin (Hb A1C) ?- Ambulatory referral to Ophthalmology ? ?2. Essential hypertension ?Blood pressure not at goal.  She will continue nifedipine. ?Encouraged her to continue to limit salt in the foods. ?Recommend wearing compression socks during the day. ?Continue nifedipine. ?Add low-dose of hydrochlorothiazide for her to take 3 times a week. ?- CBC; Future ?- Comprehensive metabolic panel; Future ?- hydrochlorothiazide (HYDRODIURIL) 12.5 MG tablet; Take 1 tab by mouth every Mon/Wed/Fri for blood pressure and lower extremity swelling  Dispense: 90 tablet; Refill: 3 ? ?3. Hyperlipidemia associated with type 2 diabetes mellitus ([place]) ?Continue  atorvastatin. ?- Lipid panel; Future ? ?4. Primary hyperparathyroidism ([place]) ?- Parathyroid hormone, intact (no Ca); Future ?- VITAMIN D 25 Hydroxy (Vit-D Deficiency, Fractures); Future ? ? ? ?Patient was given the opportu

## 2021-10-09 ENCOUNTER — Other Ambulatory Visit: Payer: Self-pay | Admitting: Pharmacist

## 2021-10-09 NOTE — Chronic Care Management (AMB) (Signed)
Patient contacted by Sinda Du, PharmD Candidate regarding hypertension. ? ?Patient has an automated home blood pressure machine, but is not checking at home.  ? ?Medication review was performed. They are not taking medications as prescribed.  ? ?The following barriers to adherence were noted: ?- Questions/Concerns about medications - Patient has not been taking HCTZ due to concerns that it could cause cardiovascular disease. Patient was counseled that hypertension is more likely to cause negative outcomes. Patient will restart after the call today.  ? ? ?The following interventions were completed:  ?- Medications were reviewed ?- Patient was educated on medications, including indication and administration ?- Patient was educated on proper technique to check home blood pressure and reminded to bring home machine and readings to next provider appointment ?- Patient was counseled on lifestyle modifications to improve blood pressure - focusing on lean proteins, vegetables.  ? ?Patient is unable to get to the pharmacy during the week for a blood pressure check due to her work schedule, but we will call her back in ~ 2 weeks for follow up.  ? ?PCP visit scheduled in September.  ? ?Catie Hedwig Morton, PharmD, BCACP ?[place] ?[phone] ?

## 2021-10-10 ENCOUNTER — Ambulatory Visit: Payer: Medicaid Other | Attending: Internal Medicine

## 2021-10-10 DIAGNOSIS — E785 Hyperlipidemia, unspecified: Secondary | ICD-10-CM | POA: Diagnosis not present

## 2021-10-10 DIAGNOSIS — E210 Primary hyperparathyroidism: Secondary | ICD-10-CM | POA: Diagnosis not present

## 2021-10-10 DIAGNOSIS — E1169 Type 2 diabetes mellitus with other specified complication: Secondary | ICD-10-CM | POA: Diagnosis not present

## 2021-10-10 DIAGNOSIS — I10 Essential (primary) hypertension: Secondary | ICD-10-CM

## 2021-10-10 DIAGNOSIS — E669 Obesity, unspecified: Secondary | ICD-10-CM | POA: Diagnosis not present

## 2021-10-11 LAB — MICROALBUMIN / CREATININE URINE RATIO
Creatinine, Urine: 103.1 mg/dL
Microalb/Creat Ratio: 8 mg/g creat (ref 0–29)
Microalbumin, Urine: 8.7 ug/mL

## 2021-10-11 LAB — COMPREHENSIVE METABOLIC PANEL
ALT: 18 IU/L (ref 0–32)
AST: 25 IU/L (ref 0–40)
Albumin/Globulin Ratio: 1.6 (ref 1.2–2.2)
Albumin: 4.7 g/dL (ref 3.8–4.8)
Alkaline Phosphatase: 88 IU/L (ref 44–121)
BUN/Creatinine Ratio: 20 (ref 12–28)
BUN: 17 mg/dL (ref 8–27)
Bilirubin Total: 0.7 mg/dL (ref 0.0–1.2)
CO2: 21 mmol/L (ref 20–29)
Calcium: 11.1 mg/dL — ABNORMAL HIGH (ref 8.7–10.3)
Chloride: 108 mmol/L — ABNORMAL HIGH (ref 96–106)
Creatinine, Ser: 0.83 mg/dL (ref 0.57–1.00)
Globulin, Total: 3 g/dL (ref 1.5–4.5)
Glucose: 97 mg/dL (ref 70–99)
Potassium: 4.4 mmol/L (ref 3.5–5.2)
Sodium: 141 mmol/L (ref 134–144)
Total Protein: 7.7 g/dL (ref 6.0–8.5)
eGFR: 76 mL/min/{1.73_m2} (ref >59)

## 2021-10-11 LAB — CBC
Hematocrit: 38.3 % (ref 34.0–46.6)
Hemoglobin: 13.1 g/dL (ref 11.1–15.9)
MCH: 30.2 pg (ref 26.6–33.0)
MCHC: 34.2 g/dL (ref 31.5–35.7)
MCV: 88 fL (ref 79–97)
Platelets: 217 10*3/uL (ref 150–450)
RBC: 4.34 x10E6/uL (ref 3.77–5.28)
RDW: 12.9 % (ref 11.7–15.4)
WBC: 5.2 10*3/uL (ref 3.4–10.8)

## 2021-10-11 LAB — PARATHYROID HORMONE, INTACT (NO CA): PTH: 70 pg/mL — ABNORMAL HIGH (ref 15–65)

## 2021-10-11 LAB — LIPID PANEL
Chol/HDL Ratio: 2.1 ratio (ref 0.0–4.4)
Cholesterol, Total: 150 mg/dL (ref 100–199)
HDL: 71 mg/dL (ref >39)
LDL Chol Calc (NIH): 68 mg/dL (ref 0–99)
Triglycerides: 48 mg/dL (ref 0–149)
VLDL Cholesterol Cal: 11 mg/dL (ref 5–40)

## 2021-10-11 LAB — VITAMIN D 25 HYDROXY (VIT D DEFICIENCY, FRACTURES): Vit D, 25-Hydroxy: 27 ng/mL — ABNORMAL LOW (ref 30.0–100.0)

## 2021-10-11 NOTE — Progress Notes (Signed)
Let patient know that her vitamin D level is mildly low.  Please inquire whether she is still taking the vitamin D 400 units daily.  If she has not been taking it, she should start taking it.   ?Calcium level is elevated due to known history of parathyroid disease.   ?Kidney function is good.   ?Liver function normal.   ?Cholesterol level normal.

## 2021-10-14 ENCOUNTER — Telehealth: Payer: Self-pay

## 2021-10-14 NOTE — Telephone Encounter (Signed)
Returned pt call and went over provider response pt states she understands and doesn't have any questions or concerns  ?

## 2021-10-14 NOTE — Telephone Encounter (Signed)
Contacted pt to go over lab results. Pt states she has been her Vit D 400 every day. Pt doesn't have any questions or concerns  ?

## 2021-10-23 ENCOUNTER — Other Ambulatory Visit: Payer: Self-pay | Admitting: Pharmacist

## 2021-10-23 NOTE — Chronic Care Management (AMB) (Signed)
Patient attempted to be outreached by Sinda Du, PharmD Candidate on 10/23/21 to discuss hypertension.   Left voicemail for patient to return our call at her convenience.    Catie Hedwig Morton, PharmD, [facility] [phone]

## 2021-10-30 ENCOUNTER — Other Ambulatory Visit: Payer: Self-pay

## 2021-10-31 ENCOUNTER — Other Ambulatory Visit: Payer: Self-pay

## 2021-10-31 DIAGNOSIS — Z419 Encounter for procedure for purposes other than remedying health state, unspecified: Secondary | ICD-10-CM | POA: Diagnosis not present

## 2021-11-08 ENCOUNTER — Other Ambulatory Visit: Payer: Self-pay | Admitting: Pharmacist

## 2021-11-08 NOTE — Chronic Care Management (AMB) (Unsigned)
Patient appearing on report for [employer] - Hypertension Control report due to last documented ambulatory blood pressure of 144/80 on 10/04/21. Next appointment with PCP is 02/07/22   Outreached patient to discuss hypertension control and medication management. She noted that she would call me back in ~ 30 minutes.   She did not call me back by end of business. Will attempt outreach next week.   Catie Hedwig Morton, PharmD, [facility] [phone]

## 2021-11-19 ENCOUNTER — Telehealth: Payer: Self-pay | Admitting: Pharmacist

## 2021-11-19 NOTE — Telephone Encounter (Signed)
Attempted to contact patient for follow up on hypertension. Left voicemail for her to return my call at her convenience.   Catie Hedwig Morton, PharmD, [facility] [phone]

## 2021-11-30 DIAGNOSIS — Z419 Encounter for procedure for purposes other than remedying health state, unspecified: Secondary | ICD-10-CM | POA: Diagnosis not present

## 2021-12-05 DIAGNOSIS — H25811 Combined forms of age-related cataract, right eye: Secondary | ICD-10-CM | POA: Diagnosis not present

## 2021-12-06 ENCOUNTER — Other Ambulatory Visit: Payer: Self-pay | Admitting: Internal Medicine

## 2021-12-06 ENCOUNTER — Other Ambulatory Visit: Payer: Self-pay

## 2021-12-06 DIAGNOSIS — E1169 Type 2 diabetes mellitus with other specified complication: Secondary | ICD-10-CM

## 2021-12-06 MED ORDER — ATORVASTATIN CALCIUM 40 MG PO TABS
ORAL_TABLET | Freq: Every day | ORAL | 1 refills | Status: DC
  Filled 2021-12-06: qty 30, 30d supply, fill #0
  Filled 2022-01-03: qty 30, 30d supply, fill #1

## 2021-12-06 MED ORDER — NIFEDIPINE ER OSMOTIC RELEASE 30 MG PO TB24
30.0000 mg | ORAL_TABLET | Freq: Every day | ORAL | 1 refills | Status: DC
  Filled 2021-12-06: qty 30, 30d supply, fill #0
  Filled 2022-01-03: qty 30, 30d supply, fill #1

## 2021-12-11 ENCOUNTER — Other Ambulatory Visit: Payer: Self-pay

## 2021-12-11 DIAGNOSIS — L819 Disorder of pigmentation, unspecified: Secondary | ICD-10-CM | POA: Diagnosis not present

## 2021-12-11 DIAGNOSIS — E7029 Other disorders of tyrosine metabolism: Secondary | ICD-10-CM | POA: Diagnosis not present

## 2021-12-31 DIAGNOSIS — Z419 Encounter for procedure for purposes other than remedying health state, unspecified: Secondary | ICD-10-CM | POA: Diagnosis not present

## 2022-01-03 ENCOUNTER — Other Ambulatory Visit: Payer: Self-pay

## 2022-01-03 DIAGNOSIS — H409 Unspecified glaucoma: Secondary | ICD-10-CM | POA: Diagnosis not present

## 2022-01-03 DIAGNOSIS — H269 Unspecified cataract: Secondary | ICD-10-CM | POA: Diagnosis not present

## 2022-01-03 DIAGNOSIS — H25811 Combined forms of age-related cataract, right eye: Secondary | ICD-10-CM | POA: Diagnosis not present

## 2022-01-03 DIAGNOSIS — H4060X0 Glaucoma secondary to drugs, unspecified eye, stage unspecified: Secondary | ICD-10-CM | POA: Diagnosis not present

## 2022-01-03 DIAGNOSIS — H4061X0 Glaucoma secondary to drugs, right eye, stage unspecified: Secondary | ICD-10-CM | POA: Diagnosis not present

## 2022-01-03 MED ORDER — PREDNISOLONE ACETATE 1 % OP SUSP
OPHTHALMIC | 2 refills | Status: DC
  Filled 2022-01-03: qty 5, 34d supply, fill #0
  Filled 2022-01-28 – 2022-02-06 (×3): qty 5, 34d supply, fill #1
  Filled 2022-03-10: qty 5, 34d supply, fill #2

## 2022-01-03 MED ORDER — OFLOXACIN 0.3 % OP SOLN
OPHTHALMIC | 0 refills | Status: DC
  Filled 2022-01-03: qty 5, 18d supply, fill #0

## 2022-01-28 ENCOUNTER — Other Ambulatory Visit: Payer: Self-pay | Admitting: Internal Medicine

## 2022-01-28 ENCOUNTER — Other Ambulatory Visit: Payer: Self-pay

## 2022-01-28 DIAGNOSIS — E1169 Type 2 diabetes mellitus with other specified complication: Secondary | ICD-10-CM

## 2022-01-29 ENCOUNTER — Other Ambulatory Visit: Payer: Self-pay

## 2022-01-29 MED ORDER — NIFEDIPINE ER OSMOTIC RELEASE 30 MG PO TB24
30.0000 mg | ORAL_TABLET | Freq: Every day | ORAL | 0 refills | Status: DC
  Filled 2022-01-29 – 2022-02-06 (×2): qty 90, 90d supply, fill #0

## 2022-01-29 MED ORDER — ATORVASTATIN CALCIUM 40 MG PO TABS
ORAL_TABLET | Freq: Every day | ORAL | 0 refills | Status: DC
  Filled 2022-01-29 – 2022-02-06 (×2): qty 90, 90d supply, fill #0

## 2022-01-29 NOTE — Telephone Encounter (Signed)
Requested Prescriptions  Pending Prescriptions Disp Refills  . atorvastatin (LIPITOR) 40 MG tablet 90 tablet 0    Sig: TAKE 1 TABLET (40 MG TOTAL) BY MOUTH DAILY.     Cardiovascular:  Antilipid - Statins Failed - 01/28/2022  3:19 PM      Failed - Lipid Panel in normal range within the last 12 months    Cholesterol, Total  Date Value Ref Range Status  10/10/2021 150 100 - 199 mg/dL Final   LDL Chol Calc (NIH)  Date Value Ref Range Status  10/10/2021 68 0 - 99 mg/dL Final   HDL  Date Value Ref Range Status  10/10/2021 71 >39 mg/dL Final   Triglycerides  Date Value Ref Range Status  10/10/2021 48 0 - 149 mg/dL Final         Passed - Patient is not pregnant      Passed - Valid encounter within last 12 months    Recent Outpatient Visits          3 months ago Type 2 diabetes mellitus with obesity ([place])   [place] Karle Plumber B, MD   11 months ago Type 2 diabetes mellitus with obesity [facility])   [place], [name], MD   1 year ago Type 2 diabetes mellitus with obesity [facility])   [place] Ladell Pier, MD   1 year ago Need for influenza vaccination   [place], Stephen L, RPH-CPP   1 year ago Type 2 diabetes mellitus with obesity [facility])   [place], MD      Future Appointments            In 1 week Ladell Pier, MD [place]           . NIFEdipine (PROCARDIA-XL/NIFEDICAL-XL) 30 MG 24 hr tablet 90 tablet 0    Sig: Take 1 tablet (30 mg total) by mouth daily.     Cardiovascular: Calcium Channel Blockers 2 Failed - 01/28/2022  3:19 PM      Failed - Last BP in normal range    BP Readings from Last 1 Encounters:  10/04/21 (!) 144/80         Passed - Last Heart Rate in normal range    Pulse Readings from Last 1 Encounters:   10/04/21 72         Passed - Valid encounter within last 6 months    Recent Outpatient Visits          3 months ago Type 2 diabetes mellitus with obesity ([place])   [place] Karle Plumber B, MD   11 months ago Type 2 diabetes mellitus with obesity [facility])   [place], [name], MD   1 year ago Type 2 diabetes mellitus with obesity [facility])   [place] Ladell Pier, MD   1 year ago Need for influenza vaccination   [place], Stephen L, RPH-CPP   1 year ago Type 2 diabetes mellitus with obesity [facility])   [place], MD      Future Appointments            In 1 week Ladell Pier, MD [facility]

## 2022-01-31 ENCOUNTER — Other Ambulatory Visit: Payer: Self-pay

## 2022-01-31 DIAGNOSIS — H409 Unspecified glaucoma: Secondary | ICD-10-CM | POA: Diagnosis not present

## 2022-01-31 DIAGNOSIS — H4062X0 Glaucoma secondary to drugs, left eye, stage unspecified: Secondary | ICD-10-CM | POA: Diagnosis not present

## 2022-01-31 DIAGNOSIS — H4060X0 Glaucoma secondary to drugs, unspecified eye, stage unspecified: Secondary | ICD-10-CM | POA: Diagnosis not present

## 2022-01-31 DIAGNOSIS — H25812 Combined forms of age-related cataract, left eye: Secondary | ICD-10-CM | POA: Diagnosis not present

## 2022-01-31 DIAGNOSIS — Z419 Encounter for procedure for purposes other than remedying health state, unspecified: Secondary | ICD-10-CM | POA: Diagnosis not present

## 2022-01-31 DIAGNOSIS — H269 Unspecified cataract: Secondary | ICD-10-CM | POA: Diagnosis not present

## 2022-02-04 ENCOUNTER — Other Ambulatory Visit: Payer: Self-pay

## 2022-02-04 MED ORDER — OFLOXACIN 0.3 % OP SOLN
OPHTHALMIC | 0 refills | Status: DC
Start: 2022-02-04 — End: 2023-07-08
  Filled 2022-02-04: qty 5, 20d supply, fill #0

## 2022-02-05 ENCOUNTER — Other Ambulatory Visit: Payer: Self-pay

## 2022-02-06 ENCOUNTER — Other Ambulatory Visit: Payer: Self-pay

## 2022-02-07 ENCOUNTER — Other Ambulatory Visit: Payer: Self-pay

## 2022-02-07 ENCOUNTER — Encounter: Payer: Self-pay | Admitting: Internal Medicine

## 2022-02-07 ENCOUNTER — Ambulatory Visit: Payer: Medicaid Other | Attending: Internal Medicine | Admitting: Internal Medicine

## 2022-02-07 VITALS — BP 144/80 | HR 77 | Temp 99.3°F | Ht <= 58 in | Wt 167.2 lb

## 2022-02-07 DIAGNOSIS — R600 Localized edema: Secondary | ICD-10-CM

## 2022-02-07 DIAGNOSIS — Z23 Encounter for immunization: Secondary | ICD-10-CM | POA: Diagnosis not present

## 2022-02-07 DIAGNOSIS — Z6837 Body mass index (BMI) 37.0-37.9, adult: Secondary | ICD-10-CM

## 2022-02-07 DIAGNOSIS — E559 Vitamin D deficiency, unspecified: Secondary | ICD-10-CM

## 2022-02-07 DIAGNOSIS — E210 Primary hyperparathyroidism: Secondary | ICD-10-CM

## 2022-02-07 DIAGNOSIS — E1169 Type 2 diabetes mellitus with other specified complication: Secondary | ICD-10-CM

## 2022-02-07 DIAGNOSIS — M25511 Pain in right shoulder: Secondary | ICD-10-CM | POA: Diagnosis not present

## 2022-02-07 DIAGNOSIS — M25512 Pain in left shoulder: Secondary | ICD-10-CM

## 2022-02-07 DIAGNOSIS — E785 Hyperlipidemia, unspecified: Secondary | ICD-10-CM

## 2022-02-07 DIAGNOSIS — I10 Essential (primary) hypertension: Secondary | ICD-10-CM

## 2022-02-07 DIAGNOSIS — E669 Obesity, unspecified: Secondary | ICD-10-CM

## 2022-02-07 LAB — POCT GLYCOSYLATED HEMOGLOBIN (HGB A1C): HbA1c, POC (controlled diabetic range): 6.2 % (ref 0.0–7.0)

## 2022-02-07 MED ORDER — VALSARTAN 40 MG PO TABS
40.0000 mg | ORAL_TABLET | Freq: Every day | ORAL | 5 refills | Status: DC
  Filled 2022-02-07: qty 30, 30d supply, fill #0
  Filled 2022-03-05: qty 30, 30d supply, fill #1
  Filled 2022-03-17: qty 8, 8d supply, fill #2
  Filled 2022-03-26: qty 8, 8d supply, fill #3
  Filled 2022-03-28: qty 30, 30d supply, fill #3
  Filled 2022-05-05: qty 30, 30d supply, fill #4

## 2022-02-07 NOTE — Progress Notes (Signed)
  Patient ID: [name], female    DOB: 12/17/1950  MRN: [id number]  CC: Diabetes   Subjective: [name] is a 71 y.o. female who presents for chronic ds management Her concerns today include:  Patient with history of DM, HTN, HL, OA, hyperPTH, osteopenia.  Since last visit she had cataract extract LT eye 01/31/2022 and RT one done 12/2021.  Seeing better.  DM: A1C 6.2 Diet control but pt states "I eat a lot" but the right stuff.  Eats a lot of fruits and veggies Walking 4-10,000 steps a day.   Taking and tolerating Lipitor 40 mg daily. Last LDL 68 Taking Nifedipine 30 mg daily.  HCTZ added on last visit for better control and LE edema. LE edema has dec some.  Compression socks too tight Has device but not checking BP No CP or SOB  C/o pain in both shoulders x 6 wks.  Thinks it is due to her having to pull hard to take off compression socks. She stopped wearing the socks. Takes Tylenol 2 BID but not for the past 2 wks.  Took 2 last night because she felt pain was coming back  Hyperparathyroid:  vit D level was 27. I had her increase Vit D from 400 IU daily to 800 IU daily.   Due for repeat bone density  Saw derm at [facility] for dark spots on her face.  Referred her to derm in [place].  Told to d/c using skin lightening creme from [place].  Had a laser treatment 6 wks ago.  She does not think it has helped.  Has f/u appt next wk  HM:  agrees for flu shot.     Patient Active Problem List   Diagnosis Date Noted   Osteopenia of neck of right femur 12/09/2019   History of cataract 12/09/2019   Screening breast examination 07/07/2019   Primary hyperparathyroidism (HCC) 04/24/2019   Adenomatous polyp of colon 07/29/2018   Scarring of skin 07/16/2017   Breast calcification, left 07/16/2017   Immunization due 04/03/2017   Hyperlipidemia associated with type 2 diabetes mellitus (HCC) 05/18/2016   Primary osteoarthritis involving multiple joints 05/16/2016    Diabetes type 2, controlled (HCC) 03/13/2015   Ptosis, both eyelids 03/13/2015   Bilateral cataracts 03/13/2015   Hypertension 10/06/2014     Current Outpatient Medications on File Prior to Visit  Medication Sig Dispense Refill   atorvastatin (LIPITOR) 40 MG tablet TAKE 1 TABLET (40 MG TOTAL) BY MOUTH DAILY. 90 tablet 0   Blood Glucose Monitoring Suppl (TRUE METRIX METER) W/DEVICE KIT 1 each by Does not apply route as needed. 1 kit 0   Cholecalciferol (VITAMIN D3) 10 MCG (400 UNIT) tablet Take 1 tablet (400 Units total) by mouth daily. 100 tablet 1   Glucosamine-Chondroitin 750-600 MG TABS Take 2 tablets by mouth daily.  0   glucose blood (TRUE METRIX BLOOD GLUCOSE TEST) test strip 1 each by Other route daily. 100 each 3   hydrochlorothiazide (HYDRODIURIL) 12.5 MG tablet Take 1 tab by mouth every Mon/Wed/Fri for blood pressure and lower extremity swelling 90 tablet 3   ibuprofen (ADVIL,MOTRIN) 400 MG tablet Take 400 mg by mouth every 6 (six) hours as needed.     NIFEdipine (PROCARDIA-XL/NIFEDICAL-XL) 30 MG 24 hr tablet Take 1 tablet (30 mg total) by mouth daily. 90 tablet 0   ofloxacin (OCUFLOX) 0.3 % ophthalmic solution Instill 1 drop into the right eye four times daily for 7 days 5 mL 0     prednisoLONE acetate (PRED FORTE) 1 % ophthalmic suspension instill 1 drop by ophthalmic route 3 times daily into the RIGHT eye 10 mL 5   prednisoLONE acetate (PRED FORTE) 1 % ophthalmic suspension Instil 1 drop into the right four times daily for 7 days , then 1 drop twice daily for 7 days, then 1 drop daily for 14 days, then stop. 5 mL 2   TRUEPLUS LANCETS 28G MISC 1 each by Does not apply route daily. 100 each 3   Turmeric 500 MG CAPS Take 500 mg by mouth daily.     Current Facility-Administered Medications on File Prior to Visit  Medication Dose Route Frequency Provider Last Rate Last Admin   0.9 %  sodium chloride infusion  500 mL Intravenous Once [place], Kimberly, MD        No Known  Allergies  Social History   Socioeconomic History   Marital status: Married    Spouse name: Not on file   Number of children: 3   Years of education: 12    Highest education level: High school graduate  Occupational History   Not on file  Tobacco Use   Smoking status: Never   Smokeless tobacco: Never  Vaping Use   Vaping Use: Never used  Substance and Sexual Activity   Alcohol use: Not Currently    Comment: rarely   Drug use: No   Sexual activity: Not Currently    Birth control/protection: Post-menopausal  Other Topics Concern   Not on file  Social History Narrative   From [place]   Moved to [place] on 09/11/2014      Has 3 children- 1 daughter in [place],    1 child in [place]   Social Determinants of Health   Financial Resource Strain: Not on file  Food Insecurity: Not on file  Transportation Needs: Unmet Transportation Needs (08/23/2020)   PRAPARE - Transportation    Lack of Transportation (Medical): Yes    Lack of Transportation (Non-Medical): Yes  Physical Activity: Sufficiently Active (05/28/2017)   Exercise Vital Sign    Days of Exercise per Week: 7 days    Minutes of Exercise per Session: 30 min  Stress: No Stress Concern Present (05/28/2017)   [employer] - Occupational Stress Questionnaire    Feeling of Stress : Only a little  Social Connections: Moderately Integrated (05/28/2017)   Social Connection and Isolation Panel [NHANES]    Frequency of Communication with Friends and Family: More than three times a week    Frequency of Social Gatherings with Friends and Family: More than three times a week    Attends Religious Services: 1 to 4 times per year    Active Member of Clubs or Organizations: No    Attends Club or Organization Meetings: Never    Marital Status: Married  Intimate Partner Violence: Not At Risk (05/28/2017)   Humiliation, Afraid, Rape, and Kick questionnaire    Fear of Current or Ex-Partner: No    Emotionally Abused: No     Physically Abused: No    Sexually Abused: No    Family History  Problem Relation Age of Onset   Hypertension Mother    Diabetes Mother    Cancer Father        prostate   Diabetes Sister    Colon cancer Neg Hx    Esophageal cancer Neg Hx    Rectal cancer Neg Hx    Stomach cancer Neg Hx    Breast cancer Neg Hx       Past Surgical History:  Procedure Laterality Date   CATARACT EXTRACTION, BILATERAL     LT 01/31/2022, RT 12/2021   CESAREAN SECTION  1990   PTERYGIUM EXCISION Left 09/01/2020    ROS: Review of Systems Negative except as stated above  PHYSICAL EXAM: BP (!) 143/88   Pulse 77   Temp 99.3 F (37.4 C) (Oral)   Ht 4' 8" (1.422 m)   Wt 167 lb 3.2 oz (75.8 kg)   SpO2 97%   BMI 37.49 kg/m   Wt Readings from Last 3 Encounters:  02/07/22 167 lb 3.2 oz (75.8 kg)  10/04/21 161 lb (73 kg)  02/08/21 164 lb 9.6 oz (74.7 kg)    Physical Exam  General appearance - alert, well appearing, older african female and in no distress Mental status - normal mood, behavior, speech, dress, motor activity, and thought processes Neck - supple, no significant adenopathy Chest - clear to auscultation, no wheezes, rales or rhonchi, symmetric air entry Heart -regular rate and rhythm.  1-2/6 SEM heard best at the left upper sternal border. Extremities -trace to 1+ bilateral lower extremity edema MSK: No point tenderness on palpation of the glenohumeral joint both sides.  She has very good range of motion of both joints. Diabetic Foot Exam - Simple   Simple Foot Form Diabetic Foot exam was performed with the following findings: Yes 02/07/2022  2:20 PM  Visual Inspection No deformities, no ulcerations, no other skin breakdown bilaterally: Yes Sensation Testing Intact to touch and monofilament testing bilaterally: Yes Pulse Check Posterior Tibialis and Dorsalis pulse intact bilaterally: Yes Comments         Latest Ref Rng & Units 10/10/2021   10:39 AM 06/15/2020   10:42 AM 09/09/2019    10:15 AM  CMP  Glucose 70 - 99 mg/dL 97  104  117   BUN 8 - 27 mg/dL 17  17  17   Creatinine 0.57 - 1.00 mg/dL 0.83  0.78  0.82   Sodium 134 - 144 mmol/L 141  139  139   Potassium 3.5 - 5.2 mmol/L 4.4  4.2  4.2   Chloride 96 - 106 mmol/L 108  101  101   CO2 20 - 29 mmol/L 21  25  24   Calcium 8.7 - 10.3 mg/dL 11.1  10.7  10.9   Total Protein 6.0 - 8.5 g/dL 7.7  8.1  7.4   Total Bilirubin 0.0 - 1.2 mg/dL 0.7  0.7  0.7   Alkaline Phos 44 - 121 IU/L 88  67  65   AST 0 - 40 IU/L 25  24  25   ALT 0 - 32 IU/L 18  18  21    Lipid Panel     Component Value Date/Time   CHOL 150 10/10/2021 1039   TRIG 48 10/10/2021 1039   HDL 71 10/10/2021 1039   CHOLHDL 2.1 10/10/2021 1039   CHOLHDL 3.3 05/16/2016 0928   VLDL 21 05/16/2016 0928   LDLCALC 68 10/10/2021 1039    CBC    Component Value Date/Time   WBC 5.2 10/10/2021 1039   WBC 5.2 05/16/2016 0944   RBC 4.34 10/10/2021 1039   RBC 4.72 05/16/2016 0944   HGB 13.1 10/10/2021 1039   HCT 38.3 10/10/2021 1039   PLT 217 10/10/2021 1039   MCV 88 10/10/2021 1039   MCH 30.2 10/10/2021 1039   MCH 29.7 05/16/2016 0944   MCHC 34.2 10/10/2021 1039   MCHC 34.2 05/16/2016 0944     RDW 12.9 10/10/2021 1039   LYMPHSABS 2.0 11/26/2017 0913   MONOABS 0.8 10/06/2014 1049   EOSABS 0.1 11/26/2017 0913   BASOSABS 0.1 11/26/2017 0913    ASSESSMENT AND PLAN: 1. Type 2 diabetes mellitus with obesity (HCC) At goal on diet control. Discussed and encourage healthy eating habits.  Encouraged her to stay active. - POCT glycosylated hemoglobin (Hb A1C)  2. Essential hypertension Not at goal. I think the nifedipine may be contributing to the lower extremity edema.  I recommend that we stop nifedipine and put her on Diovan 40 mg instead.  Patient agreeable to this.  Continue HCTZ 12.5 mg daily.  After she has been on the medication, Diovan, for 2 weeks, she should return to the lab to have chemistry level checked. - valsartan (DIOVAN) 40 MG tablet; Take 1  tablet (40 mg total) by mouth daily.  Dispense: 30 tablet; Refill: 5 - Basic Metabolic Panel; Future  3. Hyperlipidemia associated with type 2 diabetes mellitus (HCC) Continue atorvastatin.  4. Primary hyperparathyroidism (HCC) We will get bone density study. We will see what her calcium level is when she comes to get BMP drawn in 2 weeks. - DG Bone Density; Future  5. Vitamin D deficiency Continue vitamin D supplement.  6. Bilateral shoulder pain, unspecified chronicity MSK in nature.  Recommend continued use of Tylenol as needed.  7. Edema of both lower legs See #2 above. - Brain natriuretic peptide; Future  8. Need for immunization against influenza - Flu Vaccine QUAD 6mo+IM (Fluarix, Fluzone & Alfiuria Quad PF)    Patient was given the opportunity to ask questions.  Patient verbalized understanding of the plan and was able to repeat key elements of the plan.   This documentation was completed using Dragon voice recognition technology.  Any transcriptional errors are unintentional.  Orders Placed This Encounter  Procedures   POCT glycosylated hemoglobin (Hb A1C)     Requested Prescriptions    No prescriptions requested or ordered in this encounter    No follow-ups on file.  Deborah Johnson, MD, FACP 

## 2022-02-07 NOTE — Patient Instructions (Signed)
Stop nifedipine.  Start valsartan 40 mg daily instead.  After being on this medication for 2 weeks, please return to the lab to have blood test done.

## 2022-02-21 ENCOUNTER — Ambulatory Visit: Payer: Medicaid Other | Attending: Internal Medicine

## 2022-02-21 DIAGNOSIS — I10 Essential (primary) hypertension: Secondary | ICD-10-CM

## 2022-02-21 DIAGNOSIS — R600 Localized edema: Secondary | ICD-10-CM

## 2022-02-22 LAB — BASIC METABOLIC PANEL
BUN/Creatinine Ratio: 19 (ref 12–28)
BUN: 14 mg/dL (ref 8–27)
CO2: 24 mmol/L (ref 20–29)
Calcium: 11 mg/dL — ABNORMAL HIGH (ref 8.7–10.3)
Chloride: 106 mmol/L (ref 96–106)
Creatinine, Ser: 0.75 mg/dL (ref 0.57–1.00)
Glucose: 114 mg/dL — ABNORMAL HIGH (ref 70–99)
Potassium: 4.7 mmol/L (ref 3.5–5.2)
Sodium: 143 mmol/L (ref 134–144)
eGFR: 86 mL/min/{1.73_m2} (ref >59)

## 2022-02-22 LAB — BRAIN NATRIURETIC PEPTIDE: BNP: 54.3 pg/mL (ref 0.0–100.0)

## 2022-03-02 DIAGNOSIS — Z419 Encounter for procedure for purposes other than remedying health state, unspecified: Secondary | ICD-10-CM | POA: Diagnosis not present

## 2022-03-05 ENCOUNTER — Other Ambulatory Visit: Payer: Self-pay

## 2022-03-05 ENCOUNTER — Other Ambulatory Visit: Payer: Self-pay | Admitting: Internal Medicine

## 2022-03-05 DIAGNOSIS — E1169 Type 2 diabetes mellitus with other specified complication: Secondary | ICD-10-CM

## 2022-03-05 MED ORDER — ATORVASTATIN CALCIUM 40 MG PO TABS
40.0000 mg | ORAL_TABLET | Freq: Every day | ORAL | 2 refills | Status: DC
  Filled 2022-03-05: qty 90, fill #0
  Filled 2022-08-01: qty 90, 90d supply, fill #0
  Filled 2022-12-01: qty 90, 90d supply, fill #1

## 2022-03-05 NOTE — Telephone Encounter (Signed)
Requested Prescriptions  Pending Prescriptions Disp Refills  . atorvastatin (LIPITOR) 40 MG tablet 90 tablet 2    Sig: TAKE 1 TABLET (40 MG TOTAL) BY MOUTH DAILY.     Cardiovascular:  Antilipid - Statins Failed - 03/05/2022 12:48 PM      Failed - Lipid Panel in normal range within the last 12 months    Cholesterol, Total  Date Value Ref Range Status  10/10/2021 150 100 - 199 mg/dL Final   LDL Chol Calc (NIH)  Date Value Ref Range Status  10/10/2021 68 0 - 99 mg/dL Final   HDL  Date Value Ref Range Status  10/10/2021 71 >39 mg/dL Final   Triglycerides  Date Value Ref Range Status  10/10/2021 48 0 - 149 mg/dL Final         Passed - Patient is not pregnant      Passed - Valid encounter within last 12 months    Recent Outpatient Visits          3 weeks ago Type 2 diabetes mellitus with obesity ([place])   [place] Karle Plumber B, MD   5 months ago Type 2 diabetes mellitus with obesity ([place])   [place], [name], MD   1 year ago Type 2 diabetes mellitus with obesity [facility])   [place], [name], MD   1 year ago Type 2 diabetes mellitus with obesity [facility]   [place] Ladell Pier, MD   1 year ago Need for influenza vaccination   [place], RPH-CPP      Future Appointments            In 3 months [name], [name], MD [place]

## 2022-03-06 ENCOUNTER — Other Ambulatory Visit: Payer: Self-pay

## 2022-03-10 ENCOUNTER — Other Ambulatory Visit: Payer: Self-pay

## 2022-03-11 ENCOUNTER — Other Ambulatory Visit: Payer: Self-pay

## 2022-03-14 ENCOUNTER — Other Ambulatory Visit: Payer: Self-pay

## 2022-03-17 ENCOUNTER — Other Ambulatory Visit: Payer: Self-pay

## 2022-03-20 ENCOUNTER — Other Ambulatory Visit: Payer: Self-pay

## 2022-03-26 ENCOUNTER — Other Ambulatory Visit: Payer: Self-pay

## 2022-03-28 ENCOUNTER — Other Ambulatory Visit: Payer: Self-pay

## 2022-04-02 ENCOUNTER — Other Ambulatory Visit: Payer: Self-pay

## 2022-04-02 DIAGNOSIS — Z419 Encounter for procedure for purposes other than remedying health state, unspecified: Secondary | ICD-10-CM | POA: Diagnosis not present

## 2022-04-30 ENCOUNTER — Other Ambulatory Visit: Payer: Self-pay

## 2022-05-02 DIAGNOSIS — Z419 Encounter for procedure for purposes other than remedying health state, unspecified: Secondary | ICD-10-CM | POA: Diagnosis not present

## 2022-05-05 ENCOUNTER — Other Ambulatory Visit: Payer: Self-pay | Admitting: Internal Medicine

## 2022-05-05 ENCOUNTER — Other Ambulatory Visit: Payer: Self-pay

## 2022-05-05 DIAGNOSIS — I10 Essential (primary) hypertension: Secondary | ICD-10-CM

## 2022-05-05 MED ORDER — VALSARTAN 40 MG PO TABS
40.0000 mg | ORAL_TABLET | Freq: Every day | ORAL | 1 refills | Status: DC
  Filled 2022-05-05: qty 30, 30d supply, fill #0
  Filled 2022-05-05: qty 30, 30d supply, fill #1

## 2022-05-08 ENCOUNTER — Other Ambulatory Visit: Payer: Self-pay

## 2022-06-13 ENCOUNTER — Ambulatory Visit: Payer: Medicaid Other | Admitting: Internal Medicine

## 2022-08-01 ENCOUNTER — Other Ambulatory Visit: Payer: Self-pay | Admitting: Internal Medicine

## 2022-08-01 ENCOUNTER — Other Ambulatory Visit: Payer: Self-pay

## 2022-08-01 DIAGNOSIS — I10 Essential (primary) hypertension: Secondary | ICD-10-CM

## 2022-08-01 MED ORDER — VALSARTAN 40 MG PO TABS
40.0000 mg | ORAL_TABLET | Freq: Every day | ORAL | 0 refills | Status: DC
  Filled 2022-08-01: qty 30, 30d supply, fill #0

## 2022-08-01 NOTE — Telephone Encounter (Signed)
Requested Prescriptions  Pending Prescriptions Disp Refills   valsartan (DIOVAN) 40 MG tablet 30 tablet 0    Sig: Take 1 tablet (40 mg total) by mouth daily.     Cardiovascular:  Angiotensin Receptor Blockers Failed - 08/01/2022  9:09 AM      Failed - Last BP in normal range    BP Readings from Last 1 Encounters:  02/07/22 (!) 144/80         Passed - Cr in normal range and within 180 days    Creat  Date Value Ref Range Status  05/16/2016 0.89 0.50 - 0.99 mg/dL Final    Comment:      For patients > or = 71 years of age: The upper reference limit for Creatinine is approximately 13% higher for people identified as African-American.      Creatinine, Ser  Date Value Ref Range Status  02/21/2022 0.75 0.57 - 1.00 mg/dL Final   Creatinine, Urine  Date Value Ref Range Status  05/16/2016 29 20 - 320 mg/dL Final         Passed - K in normal range and within 180 days    Potassium  Date Value Ref Range Status  02/21/2022 4.7 3.5 - 5.2 mmol/L Final         Passed - Patient is not pregnant      Passed - Valid encounter within last 6 months    Recent Outpatient Visits           5 months ago Type 2 diabetes mellitus with obesity ([place])   [place], MD   10 months ago Type 2 diabetes mellitus with obesity [facility])   [place] Karle Plumber B, MD   1 year ago Type 2 diabetes mellitus with obesity [facility])   [place] Karle Plumber B, MD   1 year ago Type 2 diabetes mellitus with obesity [facility])   [place] Ladell Pier, MD   2 years ago Need for influenza vaccination   [place], RPH-CPP       Future Appointments             In 3 weeks Ladell Pier, MD [place]

## 2022-08-06 ENCOUNTER — Other Ambulatory Visit: Payer: Self-pay

## 2022-08-08 ENCOUNTER — Other Ambulatory Visit: Payer: Self-pay

## 2022-08-15 ENCOUNTER — Inpatient Hospital Stay: Admission: RE | Admit: 2022-08-15 | Payer: Medicaid Other | Source: Ambulatory Visit

## 2022-08-15 ENCOUNTER — Other Ambulatory Visit: Payer: Self-pay | Admitting: Internal Medicine

## 2022-08-15 DIAGNOSIS — E210 Primary hyperparathyroidism: Secondary | ICD-10-CM

## 2022-08-22 ENCOUNTER — Other Ambulatory Visit: Payer: Self-pay | Admitting: Internal Medicine

## 2022-08-22 DIAGNOSIS — Z1231 Encounter for screening mammogram for malignant neoplasm of breast: Secondary | ICD-10-CM

## 2022-08-28 ENCOUNTER — Encounter: Payer: Self-pay | Admitting: Internal Medicine

## 2022-08-28 ENCOUNTER — Ambulatory Visit: Payer: Medicaid Other | Attending: Internal Medicine | Admitting: Internal Medicine

## 2022-08-28 ENCOUNTER — Other Ambulatory Visit: Payer: Self-pay

## 2022-08-28 VITALS — BP 134/80 | HR 61 | Temp 98.5°F | Ht <= 58 in | Wt 172.0 lb

## 2022-08-28 DIAGNOSIS — E210 Primary hyperparathyroidism: Secondary | ICD-10-CM

## 2022-08-28 DIAGNOSIS — E559 Vitamin D deficiency, unspecified: Secondary | ICD-10-CM

## 2022-08-28 DIAGNOSIS — E1169 Type 2 diabetes mellitus with other specified complication: Secondary | ICD-10-CM

## 2022-08-28 DIAGNOSIS — E669 Obesity, unspecified: Secondary | ICD-10-CM

## 2022-08-28 DIAGNOSIS — E785 Hyperlipidemia, unspecified: Secondary | ICD-10-CM

## 2022-08-28 DIAGNOSIS — E1159 Type 2 diabetes mellitus with other circulatory complications: Secondary | ICD-10-CM

## 2022-08-28 DIAGNOSIS — I152 Hypertension secondary to endocrine disorders: Secondary | ICD-10-CM | POA: Diagnosis not present

## 2022-08-28 DIAGNOSIS — J069 Acute upper respiratory infection, unspecified: Secondary | ICD-10-CM

## 2022-08-28 LAB — GLUCOSE, POCT (MANUAL RESULT ENTRY): POC Glucose: 110 mg/dl — AB (ref 70–99)

## 2022-08-28 LAB — POCT GLYCOSYLATED HEMOGLOBIN (HGB A1C): HbA1c, POC (controlled diabetic range): 6.8 % (ref 0.0–7.0)

## 2022-08-28 MED ORDER — BENZONATATE 100 MG PO CAPS
100.0000 mg | ORAL_CAPSULE | Freq: Three times a day (TID) | ORAL | 0 refills | Status: DC | PRN
  Filled 2022-08-28: qty 30, 10d supply, fill #0

## 2022-08-28 MED ORDER — VALSARTAN 40 MG PO TABS
40.0000 mg | ORAL_TABLET | Freq: Every day | ORAL | 0 refills | Status: DC
  Filled 2022-08-28: qty 30, 30d supply, fill #0

## 2022-08-28 NOTE — Progress Notes (Signed)
Patient ID: [name], female    DOB: [birth year]  MRN: [id number]  CC: Diabetes (DM f/u. Med refills. /Cough, sneezing, body aches X4 days /Already received flu vax. )   Subjective: [name] is a 72 y.o. female who presents for chronic ds management Her concerns today include:  Patient with history of DM, HTN, HL, OA, hyperPTH, osteopenia.   C/o cough productive of white to brown phlegm, sneezing, body aches X4 days.  Subjective fever  yesterday.  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  Worked yesterday.  Lives with her daughter and grandchildren.  One of her other daughters with her spouse  and 2 kids came from [place] 8 days ago to visit.  They all had respiratory illness; all better except for the last child who is 59 yrs old.  No home COVID test done.    DM:   Results for orders placed or performed in visit on 08/28/22  POCT glucose (manual entry)  Result Value Ref Range   POC Glucose 110 (A) 70 - 99 mg/dl  POCT glycosylated hemoglobin (Hb A1C)  Result Value Ref Range   Hemoglobin A1C     HbA1c POC (<> result, manual entry)     HbA1c, POC (prediabetic range)     HbA1c, POC (controlled diabetic range) 6.8 0.0 - 7.0 %  Checks BS several times a wk in the mornings.  Range 110-120 Not on any medications.  Diet controlled.  Did not tolerate metformin in the past.  Does a lot of walking but has not done so in the past several weeks.  Does well with her eating habits.  HTN:  on Diovan and HCTZ.  Swelling in the legs have resolved since nifedipine was discontinued. Checks blood pressure at home.  Last reading at home was 129/75 1-2 wks ago. Limits salt in foods  HL:  taking and tolerating Lipitor  HyperPTH:  bone density study scheduled for 01/2041 Taking Vit D 2000 IU daily OTC.  Patient Active Problem List   Diagnosis Date Noted   Osteopenia of neck of right femur 12/09/2019   History of  cataract 12/09/2019   Screening breast examination 07/07/2019   Primary hyperparathyroidism ([place]) 04/24/2019   Adenomatous polyp of colon 07/29/2018   Scarring of skin 07/16/2017   Breast calcification, left 07/16/2017   Immunization due 04/03/2017   Hyperlipidemia associated with type 2 diabetes mellitus ([place]) 05/18/2016   Primary osteoarthritis involving multiple joints 05/16/2016   Diabetes type 2, controlled ([place]) 03/13/2015   Ptosis, both eyelids 03/13/2015   Bilateral cataracts 03/13/2015   Hypertension 10/06/2014     Current Outpatient Medications on File Prior to Visit  Medication Sig Dispense Refill   atorvastatin (LIPITOR) 40 MG tablet Take 1 tablet (40 mg total) by mouth daily. 90 tablet 2   Blood Glucose Monitoring Suppl (TRUE METRIX METER) W/DEVICE KIT 1 each by Does not apply route as needed. 1 kit 0   Cholecalciferol (VITAMIN D3) 10 MCG (400 UNIT) tablet Take 1 tablet (400 Units total) by mouth daily. 100 tablet 1   Glucosamine-Chondroitin 750-600 MG TABS Take 2 tablets by mouth daily.  0   glucose blood (TRUE METRIX BLOOD GLUCOSE TEST) test strip 1 each by Other route daily. 100 each 3   hydrochlorothiazide (HYDRODIURIL) 12.5 MG tablet Take 1 tab by mouth every Mon/Wed/Fri for blood pressure and lower extremity swelling 90 tablet 3  ibuprofen (ADVIL,MOTRIN) 400 MG tablet Take 400 mg by mouth every 6 (six) hours as needed.     ofloxacin (OCUFLOX) 0.3 % ophthalmic solution Instill 1 drop into the right eye four times daily for 7 days 5 mL 0   prednisoLONE acetate (PRED FORTE) 1 % ophthalmic suspension instill 1 drop by ophthalmic route 3 times daily into the RIGHT eye 10 mL 5   prednisoLONE acetate (PRED FORTE) 1 % ophthalmic suspension Instil 1 drop into the right four times daily for 7 days , then 1 drop twice daily for 7 days, then 1 drop daily for 14 days, then stop. 5 mL 2   TRUEPLUS LANCETS 28G MISC 1 each by Does not apply route daily. 100 each 3   Turmeric 500 MG  CAPS Take 500 mg by mouth daily.     Current Facility-Administered Medications on File Prior to Visit  Medication Dose Route Frequency Provider Last Rate Last Admin   0.9 %  sodium chloride infusion  500 mL Intravenous Once [place], MD        No Known Allergies  Social History   Socioeconomic History   Marital status: Married    Spouse name: Not on file   Number of children: 3   Years of education: 12    Highest education level: High school graduate  Occupational History   Not on file  Tobacco Use   Smoking status: Never   Smokeless tobacco: Never  Vaping Use   Vaping Use: Never used  Substance and Sexual Activity   Alcohol use: Not Currently    Comment: rarely   Drug use: No   Sexual activity: Not Currently    Birth control/protection: Post-menopausal  Other Topics Concern   Not on file  Social History Narrative   From [place]   Moved to [place] on 09/11/2014      Has 3 children- 1 daughter in [place],    1 child in [place]   Social Determinants of Health   Financial Resource Strain: Not on file  Food Insecurity: Not on file  Transportation Needs: Unmet Transportation Needs (08/23/2020)   [place] - Transportation    Lack of Transportation (Medical): Yes    Lack of Transportation (Non-Medical): Yes  Physical Activity: Sufficiently Active (05/28/2017)   Exercise Vital Sign    Days of Exercise per Week: 7 days    Minutes of Exercise per Session: 30 min  Stress: No Stress Concern Present (05/28/2017)   [place]    Feeling of Stress : Only a little  Social Connections: Moderately Integrated (05/28/2017)   Social Connection and Isolation Panel [NHANES]    Frequency of Communication with Friends and Family: More than three times a week    Frequency of Social Gatherings with Friends and Family: More than three times a week    Attends Religious Services: 1 to 4 times per year    Active Member of [employer] or  Organizations: No    Attends Archivist Meetings: Never    Marital Status: Married  Human resources officer Violence: Not At Risk (05/28/2017)   Humiliation, Afraid, Rape, and Kick questionnaire    Fear of Current or Ex-Partner: No    Emotionally Abused: No    Physically Abused: No    Sexually Abused: No    Family History  Problem Relation Age of Onset   Hypertension Mother    Diabetes Mother    Cancer Father  prostate   Diabetes Sister    Colon cancer Neg Hx    Esophageal cancer Neg Hx    Rectal cancer Neg Hx    Stomach cancer Neg Hx    Breast cancer Neg Hx     Past Surgical History:  Procedure Laterality Date   CATARACT EXTRACTION, BILATERAL     LT 01/31/2022, RT 12/2021   CESAREAN SECTION  1990   PTERYGIUM EXCISION Left 09/01/2020    ROS: Review of Systems Negative except as stated above  PHYSICAL EXAM: BP 134/80 (BP Location: Left Arm, Patient Position: Sitting, Cuff Size: Large)   Pulse 61   Temp 98.5 F (36.9 C) (Oral)   Ht 4\' 8"  (1.422 m)   Wt 172 lb (78 kg)   SpO2 100%   BMI 38.56 kg/m   Wt Readings from Last 3 Encounters:  08/28/22 172 lb (78 kg)  02/07/22 167 lb 3.2 oz (75.8 kg)  10/04/21 161 lb (73 kg)    Physical Exam  General appearance - alert, well appearing, and in no distress.  She is not short of breath.  She is talking in full sentences. Mental status - normal mood, behavior, speech, dress, motor activity, and thought processes Nose - normal and patent, no erythema, discharge or polyps Mouth - mucous membranes moist, pharynx normal without lesions Neck - supple, no significant adenopathy Chest - clear to auscultation, no wheezes, rales or rhonchi, symmetric air entry. Heart - normal rate, regular rhythm, normal S1, S2, no murmurs, rubs, clicks or gallops Extremities - peripheral pulses normal, no pedal edema, no clubbing or cyanosis      Latest Ref Rng & Units 02/21/2022    9:12 AM 10/10/2021   10:39 AM 06/15/2020   10:42 AM   CMP  Glucose 70 - 99 mg/dL 114  97  104   BUN 8 - 27 mg/dL 14  17  17    Creatinine 0.57 - 1.00 mg/dL 0.75  0.83  0.78   Sodium 134 - 144 mmol/L 143  141  139   Potassium 3.5 - 5.2 mmol/L 4.7  4.4  4.2   Chloride 96 - 106 mmol/L 106  108  101   CO2 20 - 29 mmol/L 24  21  25    Calcium 8.7 - 10.3 mg/dL 11.0  11.1  10.7   Total Protein 6.0 - 8.5 g/dL  7.7  8.1   Total Bilirubin 0.0 - 1.2 mg/dL  0.7  0.7   Alkaline Phos 44 - 121 IU/L  88  67   AST 0 - 40 IU/L  25  24   ALT 0 - 32 IU/L  18  18    Lipid Panel     Component Value Date/Time   CHOL 150 10/10/2021 1039   TRIG 48 10/10/2021 1039   HDL 71 10/10/2021 1039   CHOLHDL 2.1 10/10/2021 1039   CHOLHDL 3.3 05/16/2016 0928   VLDL 21 05/16/2016 0928   LDLCALC 68 10/10/2021 1039    CBC    Component Value Date/Time   WBC 5.2 10/10/2021 1039   WBC 5.2 05/16/2016 0944   RBC 4.34 10/10/2021 1039   RBC 4.72 05/16/2016 0944   HGB 13.1 10/10/2021 1039   HCT 38.3 10/10/2021 1039   PLT 217 10/10/2021 1039   MCV 88 10/10/2021 1039   MCH 30.2 10/10/2021 1039   MCH 29.7 05/16/2016 0944   MCHC 34.2 10/10/2021 1039   MCHC 34.2 05/16/2016 0944   RDW 12.9 10/10/2021 1039  LYMPHSABS 2.0 11/26/2017 0913   MONOABS 0.8 10/06/2014 1049   EOSABS 0.1 11/26/2017 0913   BASOSABS 0.1 11/26/2017 0913    ASSESSMENT AND PLAN: 1. Type 2 diabetes mellitus with obesity (HCC) A1c is at goal but has crept up.  Discussed putting her on medication but patient would like to hold off.  She plans to start walking regularly again.  Encouraged her to continue healthy eating habits. - POCT glucose (manual entry) - POCT glycosylated hemoglobin (Hb A1C) - Ambulatory referral to Ophthalmology  2. Hypertension associated with diabetes ([place]) Close to goal.  Continue Diovan 40 mg daily and HCTZ.  Advised to check blood pressure at least twice a week with goal being 130/80 or lower. - valsartan (DIOVAN) 40 MG tablet; Take 1 tablet (40 mg total) by mouth daily.   Dispense: 30 tablet; Refill: 0 - Comprehensive metabolic panel  3. Hyperlipidemia associated with type 2 diabetes mellitus (HCC) Continue atorvastatin  4. URI, acute Patient declined testing for COVID and flu. Recommend that she remain home from work for the next several days until symptoms continue to improve and she has no fever for 24 hours.  Advised wearing mask even while at home until cough resolves.  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  Dispense: 30 capsule; Refill: 0  5. Primary hyperparathyroidism ([place]) Check chemistry and vitamin D level today.  6. Vitamin D deficiency - VITAMIN D 25 Hydroxy (Vit-D Deficiency, Fractures)     Patient was given the opportunity to ask questions.  Patient verbalized understanding of the plan and was able to repeat key elements of the plan.   This documentation was completed using Radio producer.  Any transcriptional errors are unintentional.  Orders Placed This Encounter  Procedures   Comprehensive metabolic panel   VITAMIN D 25 Hydroxy (Vit-D Deficiency, Fractures)   Ambulatory referral to Ophthalmology   POCT glucose (manual entry)   POCT glycosylated hemoglobin (Hb A1C)     Requested Prescriptions   Signed Prescriptions Disp Refills   valsartan (DIOVAN) 40 MG tablet 30 tablet 0    Sig: Take 1 tablet (40 mg total) by mouth daily.   benzonatate (TESSALON) 100 MG capsule 30 capsule 0    Sig: Take 1 capsule (100 mg total) by mouth 3 (three) times daily as needed for cough.    Return in about 4 months (around 12/28/2022).  Karle Plumber, MD, FACP

## 2022-08-28 NOTE — Patient Instructions (Signed)
Given your current symptoms, I recommend staying out of work at least for the next 3 days and until no fever for full 24 hours.  You should continue to wear a mask until your cough resolves.  Try to get in some form of exercise at least 3 to 5 days a week for 30 minutes.  Check blood pressure at least twice a week.  The goal is 130/80 or lower.  Let me know if your blood pressure starts running above that.

## 2022-08-29 LAB — COMPREHENSIVE METABOLIC PANEL
ALT: 20 IU/L (ref 0–32)
AST: 30 IU/L (ref 0–40)
Albumin/Globulin Ratio: 1.4 (ref 1.2–2.2)
Albumin: 4.2 g/dL (ref 3.9–4.9)
Alkaline Phosphatase: 60 IU/L (ref 44–121)
BUN/Creatinine Ratio: 17 (ref 12–28)
BUN: 11 mg/dL (ref 8–27)
Bilirubin Total: 0.5 mg/dL (ref 0.0–1.2)
CO2: 24 mmol/L (ref 20–29)
Calcium: 10 mg/dL (ref 8.7–10.3)
Chloride: 101 mmol/L (ref 96–106)
Creatinine, Ser: 0.66 mg/dL (ref 0.57–1.00)
Globulin, Total: 2.9 g/dL (ref 1.5–4.5)
Glucose: 87 mg/dL (ref 70–99)
Potassium: 4 mmol/L (ref 3.5–5.2)
Sodium: 138 mmol/L (ref 134–144)
Total Protein: 7.1 g/dL (ref 6.0–8.5)
eGFR: 94 mL/min/{1.73_m2} (ref >59)

## 2022-08-29 LAB — VITAMIN D 25 HYDROXY (VIT D DEFICIENCY, FRACTURES): Vit D, 25-Hydroxy: 39.1 ng/mL (ref 30.0–100.0)

## 2022-09-01 ENCOUNTER — Telehealth: Payer: Self-pay

## 2022-09-01 DIAGNOSIS — Z419 Encounter for procedure for purposes other than remedying health state, unspecified: Secondary | ICD-10-CM | POA: Diagnosis not present

## 2022-09-01 NOTE — Telephone Encounter (Signed)
Copied from [place] [id number]. Topic: General - Other >> Sep 01, 2022 10:22 AM Cyndi Bender wrote: Reason for CRM: Pt called for lab results. Pt requests call back. Cb# [phone]

## 2022-09-03 NOTE — Telephone Encounter (Signed)
Patient was informed of results on 09/01/2021.

## 2022-10-01 DIAGNOSIS — Z419 Encounter for procedure for purposes other than remedying health state, unspecified: Secondary | ICD-10-CM | POA: Diagnosis not present

## 2022-10-02 ENCOUNTER — Other Ambulatory Visit: Payer: Self-pay | Admitting: Internal Medicine

## 2022-10-02 ENCOUNTER — Other Ambulatory Visit: Payer: Self-pay

## 2022-10-02 DIAGNOSIS — E119 Type 2 diabetes mellitus without complications: Secondary | ICD-10-CM | POA: Diagnosis not present

## 2022-10-02 DIAGNOSIS — I152 Hypertension secondary to endocrine disorders: Secondary | ICD-10-CM

## 2022-10-02 LAB — HM DIABETES EYE EXAM

## 2022-10-02 MED ORDER — VALSARTAN 40 MG PO TABS
40.0000 mg | ORAL_TABLET | Freq: Every day | ORAL | 0 refills | Status: DC
  Filled 2022-10-02: qty 90, 90d supply, fill #0

## 2022-10-10 ENCOUNTER — Ambulatory Visit
Admission: RE | Admit: 2022-10-10 | Discharge: 2022-10-10 | Disposition: A | Discharge status: Home / Self Care | Payer: Medicaid Other | Source: Ambulatory Visit | Attending: Internal Medicine | Admitting: Internal Medicine

## 2022-10-10 DIAGNOSIS — Z1231 Encounter for screening mammogram for malignant neoplasm of breast: Secondary | ICD-10-CM | POA: Diagnosis not present

## 2022-11-01 DIAGNOSIS — Z419 Encounter for procedure for purposes other than remedying health state, unspecified: Secondary | ICD-10-CM | POA: Diagnosis not present

## 2022-11-06 ENCOUNTER — Other Ambulatory Visit: Payer: Self-pay | Admitting: Internal Medicine

## 2022-11-06 ENCOUNTER — Other Ambulatory Visit: Payer: Self-pay

## 2022-11-06 DIAGNOSIS — I10 Essential (primary) hypertension: Secondary | ICD-10-CM

## 2022-11-06 MED ORDER — HYDROCHLOROTHIAZIDE 12.5 MG PO TABS
ORAL_TABLET | ORAL | 1 refills | Status: DC
  Filled 2022-11-06: qty 12, 28d supply, fill #0
  Filled 2022-12-01: qty 12, 28d supply, fill #1

## 2022-11-06 NOTE — Telephone Encounter (Signed)
Requested medication (s) are due for refill today - expired Rx  Requested medication (s) are on the active medication list -yes  Future visit scheduled -yes  Last refill: 10/04/21 #90 [id number]  Notes to clinic: expired Rx  Requested Prescriptions  Pending Prescriptions Disp Refills   hydrochlorothiazide (HYDRODIURIL) 12.5 MG tablet 90 tablet 3    Sig: Take 1 tab by mouth every Mon/Wed/Fri for blood pressure and lower extremity swelling     Cardiovascular: Diuretics - Thiazide Passed - 11/06/2022 11:14 AM      Passed - Cr in normal range and within 180 days    Creat  Date Value Ref Range Status  05/16/2016 0.89 0.50 - 0.99 mg/dL Final    Comment:      For patients > or = 71 years of age: The upper reference limit for Creatinine is approximately 13% higher for people identified as African-American.      Creatinine, Ser  Date Value Ref Range Status  08/28/2022 0.66 0.57 - 1.00 mg/dL Final   Creatinine, Urine  Date Value Ref Range Status  05/16/2016 29 20 - 320 mg/dL Final         Passed - K in normal range and within 180 days    Potassium  Date Value Ref Range Status  08/28/2022 4.0 3.5 - 5.2 mmol/L Final         Passed - Na in normal range and within 180 days    Sodium  Date Value Ref Range Status  08/28/2022 138 134 - 144 mmol/L Final         Passed - Last BP in normal range    BP Readings from Last 1 Encounters:  08/28/22 134/80         Passed - Valid encounter within last 6 months    Recent Outpatient Visits           2 months ago Type 2 diabetes mellitus with obesity (HCC)   [place] [facility] Jonah Blue B, MD   9 months ago Type 2 diabetes mellitus with obesity [facility])   [place] [facility] Blue B, MD   1 year ago Type 2 diabetes mellitus with obesity [facility])   [place] [facility] Jonah Blue B, MD   1 year ago Type 2 diabetes mellitus with obesity [facility])    [place] [facility] Jonah Blue B, MD   2 years ago Type 2 diabetes mellitus with obesity [facility])   [facility] Marcine Matar, MD       Future Appointments             In 1 month Marcine Matar, MD [place] [facility]               Requested Prescriptions  Pending Prescriptions Disp Refills   hydrochlorothiazide (HYDRODIURIL) 12.5 MG tablet 90 tablet 3    Sig: Take 1 tab by mouth every Mon/Wed/Fri for blood pressure and lower extremity swelling     Cardiovascular: Diuretics - Thiazide Passed - 11/06/2022 11:14 AM      Passed - Cr in normal range and within 180 days    Creat  Date Value Ref Range Status  05/16/2016 0.89 0.50 - 0.99 mg/dL Final    Comment:      For patients > or = 71 years of age: The upper reference limit for  Creatinine is approximately 13% higher for people identified as African-American.      Creatinine, Ser  Date Value Ref Range Status  08/28/2022 0.66 0.57 - 1.00 mg/dL Final   Creatinine, Urine  Date Value Ref Range Status  05/16/2016 29 20 - 320 mg/dL Final         Passed - K in normal range and within 180 days    Potassium  Date Value Ref Range Status  08/28/2022 4.0 3.5 - 5.2 mmol/L Final         Passed - Na in normal range and within 180 days    Sodium  Date Value Ref Range Status  08/28/2022 138 134 - 144 mmol/L Final         Passed - Last BP in normal range    BP Readings from Last 1 Encounters:  08/28/22 134/80         Passed - Valid encounter within last 6 months    Recent Outpatient Visits           2 months ago Type 2 diabetes mellitus with obesity (HCC)   [place] [facility] Jon[facility] Blue B, MD   9 months ago Type 2 diabetes mellitus with obesity [facility])   [place] [facility] Jonah Blue B, MD   1 year ago Type 2 diabetes mellitus with obesity [facility])   [name] Jonah Blue B, MD   1 year ago Type 2 diabetes mellitus with obesity [facility])   [place] [facility] Jonah Blue B, MD   2 years ago Type 2 diabetes mellitus with obesity [place])   [place] [facility] & [facility] Marcine Matar, MD       Future Appointments             In 1 month Laural Benes, Binnie Rail, MD [facility]

## 2022-11-07 ENCOUNTER — Other Ambulatory Visit: Payer: Self-pay

## 2022-12-01 ENCOUNTER — Other Ambulatory Visit: Payer: Self-pay

## 2022-12-01 DIAGNOSIS — Z419 Encounter for procedure for purposes other than remedying health state, unspecified: Secondary | ICD-10-CM | POA: Diagnosis not present

## 2022-12-03 ENCOUNTER — Other Ambulatory Visit: Payer: Self-pay

## 2022-12-30 ENCOUNTER — Ambulatory Visit: Payer: Medicaid Other | Admitting: Internal Medicine

## 2022-12-31 ENCOUNTER — Other Ambulatory Visit: Payer: Self-pay

## 2023-01-01 ENCOUNTER — Other Ambulatory Visit: Payer: Self-pay

## 2023-01-01 DIAGNOSIS — Z419 Encounter for procedure for purposes other than remedying health state, unspecified: Secondary | ICD-10-CM | POA: Diagnosis not present

## 2023-01-05 ENCOUNTER — Other Ambulatory Visit: Payer: Self-pay | Admitting: Internal Medicine

## 2023-01-05 ENCOUNTER — Other Ambulatory Visit: Payer: Self-pay

## 2023-01-05 DIAGNOSIS — I152 Hypertension secondary to endocrine disorders: Secondary | ICD-10-CM

## 2023-01-05 DIAGNOSIS — I10 Essential (primary) hypertension: Secondary | ICD-10-CM

## 2023-01-06 ENCOUNTER — Other Ambulatory Visit: Payer: Self-pay

## 2023-01-06 MED ORDER — HYDROCHLOROTHIAZIDE 12.5 MG PO TABS
ORAL_TABLET | ORAL | 0 refills | Status: DC
  Filled 2023-01-06: qty 36, 90d supply, fill #0

## 2023-01-06 MED ORDER — VALSARTAN 40 MG PO TABS
40.0000 mg | ORAL_TABLET | Freq: Every day | ORAL | 0 refills | Status: DC
  Filled 2023-01-06: qty 90, 90d supply, fill #0

## 2023-01-06 NOTE — Telephone Encounter (Signed)
Requested Prescriptions  Pending Prescriptions Disp Refills   valsartan (DIOVAN) 40 MG tablet 90 tablet 0    Sig: Take 1 tablet (40 mg total) by mouth daily.     Cardiovascular:  Angiotensin Receptor Blockers Passed - 01/05/2023  1:00 PM      Passed - Cr in normal range and within 180 days    Creat  Date Value Ref Range Status  05/16/2016 0.89 0.50 - 0.99 mg/dL Final    Comment:      For patients > or = 71 years of age: The upper reference limit for Creatinine is approximately 13% higher for people identified as African-American.      Creatinine, Ser  Date Value Ref Range Status  08/28/2022 0.66 0.57 - 1.00 mg/dL Final   Creatinine, Urine  Date Value Ref Range Status  05/16/2016 29 20 - 320 mg/dL Final         Passed - K in normal range and within 180 days    Potassium  Date Value Ref Range Status  08/28/2022 4.0 3.5 - 5.2 mmol/L Final         Passed - Patient is not pregnant      Passed - Last BP in normal range    BP Readings from Last 1 Encounters:  08/28/22 134/80         Passed - Valid encounter within last 6 months    Recent Outpatient Visits           4 months ago Type 2 diabetes mellitus with obesity (HCC)   [place] [facility] & [facility] Jonah Blue B, MD   11 months ago Type 2 diabetes mellitus with obesity [facility])   [place] [facility] Jonah Blue B, MD   1 year ago Type 2 diabetes mellitus with obesity [facility])   [place] [facility] Jonah Blue B, MD   1 year ago Type 2 diabetes mellitus with obesity [facility])   [place] [facility] Jonah Blue B, MD   2 years ago Type 2 diabetes mellitus with obesity [facility])   [place] [facility] Marcine Matar, MD       Future Appointments             In 2 months Marcine Matar, MD [place] [facility]             hydrochlorothiazide  (HYDRODIURIL) 12.5 MG tablet 36 tablet 0    Sig: Take 1 tab by mouth every Mon/Wed/Fri for blood pressure and lower extremity swelling     Cardiovascular: Diuretics - Thiazide Passed - 01/05/2023  1:00 PM      Passed - Cr in normal range and within 180 days    Creat  Date Value Ref Range Status  05/16/2016 0.89 0.50 - 0.99 mg/dL Final    Comment:      For patients > or = 71 years of age: The upper reference limit for Creatinine is approximately 13% higher for people identified as African-American.      Creatinine, Ser  Date Value Ref Range Status  08/28/2022 0.66 0.57 - 1.00 mg/dL Final   Creatinine, Urine  Date Value Ref Range Status  05/16/2016 29 20 - 320 mg/dL Final         Passed - K in normal range and within 180 days    Potassium  Date Value Ref Range Status  08/28/2022 4.0 3.5 - 5.2 mmol/L Final         Passed - Na in normal range and within 180 days    Sodium  Date Value Ref Range Status  08/28/2022 138 134 - 144 mmol/L Final         Passed - Last BP in normal range    BP Readings from Last 1 Encounters:  08/28/22 134/80         Passed - Valid encounter within last 6 months    Recent Outpatient Visits           4 months ago Type 2 diabetes mellitus with obesity (HCC)   [place] [facility] Jonah Blue B, MD   11 months ago Type 2 diabetes mellitus with obesity [facility])   [place] [facility] Jonah Blue B, MD   1 year ago Type 2 diabetes mellitus with obesity [facility])   [place] [facility] Jon[facility] Blue B, MD   1 year ago Type 2 diabetes mellitus with obesity [facility])   [place] [facility] Jonah Blue B, MD   2 years ago Type 2 diabetes mellitus with obesity [facility])   [place] [facility] Marcine Matar, MD       Future Appointments             In 2 months Laural Benes, Binnie Rail, MD [facility]

## 2023-01-08 ENCOUNTER — Other Ambulatory Visit: Payer: Self-pay

## 2023-02-01 DIAGNOSIS — Z419 Encounter for procedure for purposes other than remedying health state, unspecified: Secondary | ICD-10-CM | POA: Diagnosis not present

## 2023-02-12 ENCOUNTER — Ambulatory Visit
Admission: RE | Admit: 2023-02-12 | Discharge: 2023-02-12 | Disposition: A | Discharge status: Home / Self Care | Payer: Medicaid Other | Source: Ambulatory Visit | Attending: Internal Medicine | Admitting: Internal Medicine

## 2023-02-12 DIAGNOSIS — N958 Other specified menopausal and perimenopausal disorders: Secondary | ICD-10-CM | POA: Diagnosis not present

## 2023-02-12 DIAGNOSIS — M8588 Other specified disorders of bone density and structure, other site: Secondary | ICD-10-CM | POA: Diagnosis not present

## 2023-02-12 DIAGNOSIS — E349 Endocrine disorder, unspecified: Secondary | ICD-10-CM | POA: Diagnosis not present

## 2023-02-12 DIAGNOSIS — E210 Primary hyperparathyroidism: Secondary | ICD-10-CM

## 2023-03-03 DIAGNOSIS — Z419 Encounter for procedure for purposes other than remedying health state, unspecified: Secondary | ICD-10-CM | POA: Diagnosis not present

## 2023-03-20 ENCOUNTER — Other Ambulatory Visit: Payer: Self-pay | Admitting: Pharmacist

## 2023-03-20 ENCOUNTER — Encounter: Payer: Self-pay | Admitting: Internal Medicine

## 2023-03-20 ENCOUNTER — Other Ambulatory Visit: Payer: Self-pay

## 2023-03-20 ENCOUNTER — Ambulatory Visit: Payer: Medicaid Other | Attending: Internal Medicine | Admitting: Internal Medicine

## 2023-03-20 VITALS — BP 152/82 | HR 64 | Temp 98.1°F | Ht <= 58 in | Wt 170.0 lb

## 2023-03-20 DIAGNOSIS — Z23 Encounter for immunization: Secondary | ICD-10-CM | POA: Diagnosis not present

## 2023-03-20 DIAGNOSIS — E1169 Type 2 diabetes mellitus with other specified complication: Secondary | ICD-10-CM | POA: Diagnosis not present

## 2023-03-20 DIAGNOSIS — I152 Hypertension secondary to endocrine disorders: Secondary | ICD-10-CM | POA: Diagnosis not present

## 2023-03-20 DIAGNOSIS — Z2821 Immunization not carried out because of patient refusal: Secondary | ICD-10-CM

## 2023-03-20 DIAGNOSIS — J069 Acute upper respiratory infection, unspecified: Secondary | ICD-10-CM

## 2023-03-20 DIAGNOSIS — Z7984 Long term (current) use of oral hypoglycemic drugs: Secondary | ICD-10-CM

## 2023-03-20 DIAGNOSIS — E1159 Type 2 diabetes mellitus with other circulatory complications: Secondary | ICD-10-CM

## 2023-03-20 DIAGNOSIS — M8580 Other specified disorders of bone density and structure, unspecified site: Secondary | ICD-10-CM | POA: Diagnosis not present

## 2023-03-20 DIAGNOSIS — E119 Type 2 diabetes mellitus without complications: Secondary | ICD-10-CM | POA: Diagnosis not present

## 2023-03-20 DIAGNOSIS — Z6379 Other stressful life events affecting family and household: Secondary | ICD-10-CM

## 2023-03-20 DIAGNOSIS — Z1211 Encounter for screening for malignant neoplasm of colon: Secondary | ICD-10-CM | POA: Diagnosis not present

## 2023-03-20 DIAGNOSIS — E785 Hyperlipidemia, unspecified: Secondary | ICD-10-CM | POA: Diagnosis not present

## 2023-03-20 DIAGNOSIS — Z780 Asymptomatic menopausal state: Secondary | ICD-10-CM

## 2023-03-20 LAB — POCT GLYCOSYLATED HEMOGLOBIN (HGB A1C): HbA1c, POC (controlled diabetic range): 6.5 % (ref 0.0–7.0)

## 2023-03-20 LAB — GLUCOSE, POCT (MANUAL RESULT ENTRY): POC Glucose: 148 mg/dL — AB (ref 70–99)

## 2023-03-20 MED ORDER — ACCU-CHEK SOFTCLIX LANCETS MISC
6 refills | Status: AC
  Filled 2023-03-20: qty 100, 100d supply, fill #0
  Filled 2023-06-10: qty 100, 100d supply, fill #1
  Filled 2023-12-15: qty 100, 100d supply, fill #2

## 2023-03-20 MED ORDER — HYDROCHLOROTHIAZIDE 12.5 MG PO TABS
12.5000 mg | ORAL_TABLET | ORAL | 2 refills | Status: DC
  Filled 2023-03-20: qty 36, 84d supply, fill #0
  Filled 2023-06-10: qty 36, 84d supply, fill #1
  Filled 2023-09-03: qty 36, 84d supply, fill #2

## 2023-03-20 MED ORDER — ACCU-CHEK GUIDE VI STRP
ORAL_STRIP | 6 refills | Status: DC
  Filled 2023-03-20: qty 100, 100d supply, fill #0

## 2023-03-20 MED ORDER — ATORVASTATIN CALCIUM 40 MG PO TABS
40.0000 mg | ORAL_TABLET | Freq: Every day | ORAL | 2 refills | Status: DC
  Filled 2023-03-20: qty 90, 90d supply, fill #0
  Filled 2023-06-10: qty 90, 90d supply, fill #1
  Filled 2023-09-14: qty 90, 90d supply, fill #2

## 2023-03-20 MED ORDER — ACCU-CHEK GUIDE W/DEVICE KIT
PACK | 0 refills | Status: DC
  Filled 2023-03-20: qty 1, 30d supply, fill #0

## 2023-03-20 MED ORDER — VALSARTAN 80 MG PO TABS
80.0000 mg | ORAL_TABLET | Freq: Every day | ORAL | 1 refills | Status: DC
  Filled 2023-03-20: qty 90, 90d supply, fill #0
  Filled 2023-06-10: qty 90, 90d supply, fill #1

## 2023-03-20 NOTE — Patient Instructions (Signed)
Your blood pressure is not at goal.  We have increased the valsartan to 80 mg daily.  We will have you follow-up with our clinical pharmacist in 6 weeks for repeat blood pressure check.  Try to check your blood pressure at least twice a week with goal being 130/80 or lower.

## 2023-03-20 NOTE — Progress Notes (Signed)
Patient ID: [name], female    DOB: [birth year]  MRN: [id number]  CC: Diabetes (DM f/u./Requesting a new BS meter - current one is not working/Coughing, wheezing in chest X2 weeks/Yes to flu vax.)   Subjective: [name] is a 72 y.o. female who presents for chronic ds management. Her concerns today include:  Patient with history of DM, HTN, HL, OA, hyperPTH, osteopenia.    Reports coughing and wheezing in past 2 wks. Clear mucus.  Took some OTC cough syrup and cough is better.  No SOB.  Had fever initially but has resolved.  DM: Results for orders placed or performed in visit on 03/20/23  POCT glycosylated hemoglobin (Hb A1C)  Result Value Ref Range   Hemoglobin A1C     HbA1c POC (<> result, manual entry)     HbA1c, POC (prediabetic range)     HbA1c, POC (controlled diabetic range) 6.5 0.0 - 7.0 %  POCT glucose (manual entry)  Result Value Ref Range   POC Glucose 148 (A) 70 - 99 mg/dl  Pt is diet control but started eating more rice recently due to stress.  Recently loss her job and is worried about this.  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  He fired her after she warned another female employee to avoid his sexual advances Eating a lot of green veggie and some fruits Walks 2-3x/wk for 1 hr  HYPERTENSION Currently taking: see medication list on Diovan 40 mg daily and hydrochlorothiazide 12.5 mg 3/wk Med Adherence: [x]  Yes    []  No Medication side effects: []  Yes    [x]  No Adherence with salt restriction: [x]  Yes    []  No Home Monitoring?: []  Yes    [x]  No but does have device. Monitoring Frequency: feels BP elev due to stress Home BP results range:  SOB? []  Yes    [x]  No Chest Pain?: []  Yes    [x]  No Leg swelling?: []  Yes    [x]  No Headaches?: []  Yes    [x]  No Dizziness? []  Yes    [x]  No Comments: Reports compliance with taking atorvastatin.  Last LDL was 68.  Hyperparathyroid: Last vitamin D level was good.  Still taking vitamin D 2000 IU  daily.  Had bone density study done last month. The bone density study showed that she still has thinning of the bones at your hip and spine. The thinning has improved slightly at the hip and stable in the spine compared to your last study done in 2021.     HM:  yes for flu, no for shingrix.  Due for repeat colonoscopy 05/2023 Patient Active Problem List   Diagnosis Date Noted   Osteopenia of neck of right femur 12/09/2019   History of cataract 12/09/2019   Screening breast examination 07/07/2019   Primary hyperparathyroidism (HCC) 04/24/2019   Adenomatous polyp of colon 07/29/2018   Scarring of skin 07/16/2017   Breast calcification, left 07/16/2017   Immunization due 04/03/2017   Hyperlipidemia associated with type 2 diabetes mellitus (HCC) 05/18/2016   Primary osteoarthritis involving multiple joints 05/16/2016   Diabetes type 2, controlled (HCC) 03/13/2015   Ptosis, both eyelids 03/13/2015   Bilateral cataracts 03/13/2015   Hypertension 10/06/2014     Current Outpatient Medications on File Prior to Visit  Medication Sig Dispense Refill   atorvastatin (LIPITOR) 40 MG tablet Take 1 tablet (40 mg total) by mouth daily. 90 tablet 2   Blood Glucose Monitoring Suppl (TRUE METRIX METER) W/DEVICE  KIT 1 each by Does not apply route as needed. 1 kit 0   Cholecalciferol (VITAMIN D3) 10 MCG (400 UNIT) tablet Take 1 tablet (400 Units total) by mouth daily. 100 tablet 1   Glucosamine-Chondroitin 750-600 MG TABS Take 2 tablets by mouth daily.  0   glucose blood (TRUE METRIX BLOOD GLUCOSE TEST) test strip 1 each by Other route daily. 100 each 3   hydrochlorothiazide (HYDRODIURIL) 12.5 MG tablet Take 1 tab by mouth every Mon/Wed/Fri for blood pressure and lower extremity swelling 36 tablet 0   ibuprofen (ADVIL,MOTRIN) 400 MG tablet Take 400 mg by mouth every 6 (six) hours as needed.     ofloxacin (OCUFLOX) 0.3 % ophthalmic solution Instill 1 drop into the right eye four times daily for 7 days 5 mL  0   prednisoLONE acetate (PRED FORTE) 1 % ophthalmic suspension instill 1 drop by ophthalmic route 3 times daily into the RIGHT eye 10 mL 5   prednisoLONE acetate (PRED FORTE) 1 % ophthalmic suspension Instil 1 drop into the right four times daily for 7 days , then 1 drop twice daily for 7 days, then 1 drop daily for 14 days, then stop. 5 mL 2   TRUEPLUS LANCETS 28G MISC 1 each by Does not apply route daily. 100 each 3   Turmeric 500 MG CAPS Take 500 mg by mouth daily.     valsartan (DIOVAN) 40 MG tablet Take 1 tablet (40 mg total) by mouth daily. 90 tablet 0   benzonatate (TESSALON) 100 MG capsule Take 1 capsule (100 mg total) by mouth 3 (three) times daily as needed for cough. (Patient not taking: Reported on 03/20/2023) 30 capsule 0   Current Facility-Administered Medications on File Prior to Visit  Medication Dose Route Frequency Provider Last Rate Last Admin   0.9 %  sodium chloride infusion  500 mL Intravenous Once Tressia Danas, MD        No Known Allergies  Social History   Socioeconomic History   Marital status: Married    Spouse name: Not on file   Number of children: 3   Years of education: 12    Highest education level: High school graduate  Occupational History   Not on file  Tobacco Use   Smoking status: Never   Smokeless tobacco: Never  Vaping Use   Vaping status: Never Used  Substance and Sexual Activity   Alcohol use: Not Currently    Comment: rarely   Drug use: No   Sexual activity: Not Currently    Birth control/protection: Post-menopausal  Other Topics Concern   Not on file  Social History Narrative   From [place]   Moved to [place] on 09/11/2014      Has 3 children- 1 daughter in [place],    1 child in [place]   Social Determinants of Health   Financial Resource Strain: Low Risk  (03/20/2023)   Overall Financial Resource Strain (CARDIA)    Difficulty of Paying Living Expenses: Not very hard  Food Insecurity: No Food Insecurity (03/20/2023)   Hunger Vital  Sign    Worried About Running Out of Food in the Last Year: Never true    Ran Out of Food in the Last Year: Never true  Transportation Needs: No Transportation Needs (03/20/2023)   PRAPARE - Administrator, Civil Service (Medical): No    Lack of Transportation (Non-Medical): No  Physical Activity: Insufficiently Active (03/20/2023)   Exercise Vital Sign    Days of  Exercise per Week: 3 days    Minutes of Exercise per Session: 30 min  Stress: Stress Concern Present (03/20/2023)   [employer] of Occupational Health - Occupational Stress Questionnaire    Feeling of Stress : Rather much  Social Connections: Moderately Integrated (03/20/2023)   Social Connection and Isolation Panel [NHANES]    Frequency of Communication with Friends and Family: Twice a week    Frequency of Social Gatherings with Friends and Family: Once a week    Attends Religious Services: More than 4 times per year    Active Member of [employer] or Organizations: No    Attends Banker Meetings: Never    Marital Status: Married  Catering manager Violence: Not At Risk (03/20/2023)   Humiliation, Afraid, Rape, and Kick questionnaire    Fear of Current or Ex-Partner: No    Emotionally Abused: No    Physically Abused: No    Sexually Abused: No    Family History  Problem Relation Age of Onset   Hypertension Mother    Diabetes Mother    Cancer Father        prostate   Diabetes Sister    Colon cancer Neg Hx    Esophageal cancer Neg Hx    Rectal cancer Neg Hx    Stomach cancer Neg Hx    Breast cancer Neg Hx     Past Surgical History:  Procedure Laterality Date   CATARACT EXTRACTION, BILATERAL     LT 01/31/2022, RT 12/2021   CESAREAN SECTION  1990   PTERYGIUM EXCISION Left 09/01/2020    ROS: Review of Systems Negative except as stated above  PHYSICAL EXAM: BP (!) 152/82 (BP Location: Left Arm, Patient Position: Sitting, Cuff Size: Normal)   Pulse 64   Temp 98.1 F (36.7 C) (Oral)    Ht 4\' 8"  (1.422 m)   Wt 170 lb (77.1 kg)   SpO2 100%   BMI 38.11 kg/m   Physical Exam BP 169/93 General appearance - alert, well appearing, and in no distress Mental status - normal mood, behavior, speech, dress, motor activity, and thought processes Neck - supple, no significant adenopathy Chest - clear to auscultation, no wheezes, rales or rhonchi, symmetric air entry Heart - normal rate, regular rhythm, normal S1, S2, no murmurs, rubs, clicks or gallops Extremities - peripheral pulses normal, no pedal edema, no clubbing or cyanosis      Latest Ref Rng & Units 08/28/2022    4:38 PM 02/21/2022    9:12 AM 10/10/2021   10:39 AM  CMP  Glucose 70 - 99 mg/dL 87  284  97   BUN 8 - 27 mg/dL 11  14  17    Creatinine 0.57 - 1.00 mg/dL 1.32  4.40  1.02   Sodium 134 - 144 mmol/L 138  143  141   Potassium 3.5 - 5.2 mmol/L 4.0  4.7  4.4   Chloride 96 - 106 mmol/L 101  106  108   CO2 20 - 29 mmol/L 24  24  21    Calcium 8.7 - 10.3 mg/dL 72.5  36.6  44.0   Total Protein 6.0 - 8.5 g/dL 7.1   7.7   Total Bilirubin 0.0 - 1.2 mg/dL 0.5   0.7   Alkaline Phos 44 - 121 IU/L 60   88   AST 0 - 40 IU/L 30   25   ALT 0 - 32 IU/L 20   18    Lipid Panel  Component Value Date/Time   CHOL 150 10/10/2021 1039   TRIG 48 10/10/2021 1039   HDL 71 10/10/2021 1039   CHOLHDL 2.1 10/10/2021 1039   CHOLHDL 3.3 05/16/2016 0928   VLDL 21 05/16/2016 0928   LDLCALC 68 10/10/2021 1039    CBC    Component Value Date/Time   WBC 5.2 10/10/2021 1039   WBC 5.2 05/16/2016 0944   RBC 4.34 10/10/2021 1039   RBC 4.72 05/16/2016 0944   HGB 13.1 10/10/2021 1039   HCT 38.3 10/10/2021 1039   PLT 217 10/10/2021 1039   MCV 88 10/10/2021 1039   MCH 30.2 10/10/2021 1039   MCH 29.7 05/16/2016 0944   MCHC 34.2 10/10/2021 1039   MCHC 34.2 05/16/2016 0944   RDW 12.9 10/10/2021 1039   LYMPHSABS 2.0 11/26/2017 0913   MONOABS 0.8 10/06/2014 1049   EOSABS 0.1 11/26/2017 0913   BASOSABS 0.1 11/26/2017 0913     ASSESSMENT AND PLAN: 1. Diabetes mellitus type 2, diet-controlled (HCC) Remains diet controlled.  Encouraged her to continue healthy eating habits and regular exercise. - POCT glycosylated hemoglobin (Hb A1C) - POCT glucose (manual entry) - CBC - Microalbumin / creatinine urine ratio  2. Hyperlipidemia associated with type 2 diabetes mellitus (HCC) At goal.  Continue atorvastatin. - atorvastatin (LIPITOR) 40 MG tablet; Take 1 tablet (40 mg total) by mouth daily.  Dispense: 90 tablet; Refill: 2 - Lipid panel  3. Hypertension associated with diabetes (HCC) Not at goal.  Continue HCTZ.  Increase Diovan to 80 mg daily.  Follow-up with clinical pharmacist in 6 weeks for recheck. - hydrochlorothiazide (HYDRODIURIL) 12.5 MG tablet; Take 1 tab by mouth every Mon/Wed/Fri for blood pressure and lower extremity swelling  Dispense: 36 tablet; Refill: 2 - valsartan (DIOVAN) 80 MG tablet; Take 1 tablet (80 mg total) by mouth daily.  Dispense: 90 tablet; Refill: 1  4. Class 2 severe obesity due to excess calories with serious comorbidity and body mass index (BMI) of 38.0 to 38.9 in adult [facility]) See #1 above  5. Osteopenia after menopause Continue vitamin D 2000 IU daily  6. Upper respiratory tract infection, unspecified type Sounds like she is on the tail end of a viral respiratory infection.  7. Stressful life event affecting family   8. Need for immunization against influenza Flu shot given today.  9. Vaccination declined Declined tetanus vaccine.  10. Screening for colon cancer - Ambulatory referral to Gastroenterology Patient was given the opportunity to ask questions.  Patient verbalized understanding of the plan and was able to repeat key elements of the plan.   This documentation was completed using Paediatric nurse.  Any transcriptional errors are unintentional.  Orders Placed This Encounter  Procedures   POCT glycosylated hemoglobin (Hb A1C)   POCT  glucose (manual entry)     Requested Prescriptions   Pending Prescriptions Disp Refills   atorvastatin (LIPITOR) 40 MG tablet 90 tablet 2    Sig: Take 1 tablet (40 mg total) by mouth daily.   hydrochlorothiazide (HYDRODIURIL) 12.5 MG tablet 36 tablet 0    Sig: Take 1 tab by mouth every Mon/Wed/Fri for blood pressure and lower extremity swelling   valsartan (DIOVAN) 40 MG tablet 90 tablet 0    Sig: Take 1 tablet (40 mg total) by mouth daily.    No follow-ups on file.  Jonah Blue, MD, FACP

## 2023-03-23 ENCOUNTER — Other Ambulatory Visit: Payer: Self-pay

## 2023-03-23 LAB — MICROALBUMIN / CREATININE URINE RATIO: Creatinine, Urine: 25.1 mg/dL

## 2023-03-23 LAB — CBC
Hematocrit: 40 % (ref 34.0–46.6)
Hemoglobin: 12.9 g/dL (ref 11.1–15.9)
MCH: 29 pg (ref 26.6–33.0)
MCHC: 32.3 g/dL (ref 31.5–35.7)
MCV: 90 fL (ref 79–97)
Platelets: 265 10*3/uL (ref 150–450)
RBC: 4.45 x10E6/uL (ref 3.77–5.28)
RDW: 13.2 % (ref 11.7–15.4)
WBC: 6.3 10*3/uL (ref 3.4–10.8)

## 2023-03-23 LAB — LIPID PANEL
Chol/HDL Ratio: 2.4 {ratio} (ref 0.0–4.4)
Cholesterol, Total: 143 mg/dL (ref 100–199)
HDL: 59 mg/dL (ref >39)
LDL Chol Calc (NIH): 67 mg/dL (ref 0–99)
Triglycerides: 90 mg/dL (ref 0–149)
VLDL Cholesterol Cal: 17 mg/dL (ref 5–40)

## 2023-03-25 ENCOUNTER — Other Ambulatory Visit: Payer: Self-pay

## 2023-04-01 ENCOUNTER — Telehealth: Payer: Self-pay | Admitting: *Deleted

## 2023-04-01 NOTE — Telephone Encounter (Signed)
-----   Message from Nurse Mackey Birchwood F sent at 03/30/2023 11:52 AM EDT ----- Patient called for lab results. Shared provider's notes.  No abnormal amounts of protein in the urine. Written by Marcine Matar, MD on 03/23/2023  4:49 PM EDT   Cholesterol levels are normal. Written by Marcine Matar, MD on 03/21/2023  9:11 AM EDT  Blood cell counts are normal. Written by Marcine Matar, MD on 03/21/2023  9:11 AM EDT  No questions regarding labs.  Pt has not heard from anyone regarding colonoscopy scheduling.

## 2023-04-01 NOTE — Telephone Encounter (Signed)
Left message on voicemail to return call.   Please inform patient.  Colonoscopy referral placed at the last OV.  Will need to listen out for a call from them.

## 2023-04-03 DIAGNOSIS — Z419 Encounter for procedure for purposes other than remedying health state, unspecified: Secondary | ICD-10-CM | POA: Diagnosis not present

## 2023-05-03 DIAGNOSIS — Z419 Encounter for procedure for purposes other than remedying health state, unspecified: Secondary | ICD-10-CM | POA: Diagnosis not present

## 2023-05-08 ENCOUNTER — Encounter: Payer: Self-pay | Admitting: Gastroenterology

## 2023-06-10 ENCOUNTER — Other Ambulatory Visit: Payer: Self-pay | Admitting: Internal Medicine

## 2023-06-10 ENCOUNTER — Other Ambulatory Visit: Payer: Self-pay

## 2023-06-10 MED ORDER — GLUCOSE BLOOD VI STRP
ORAL_STRIP | 6 refills | Status: AC
  Filled 2023-06-10: qty 100, 100d supply, fill #0
  Filled 2023-12-15: qty 100, 100d supply, fill #1

## 2023-06-11 ENCOUNTER — Other Ambulatory Visit: Payer: Self-pay

## 2023-06-17 ENCOUNTER — Ambulatory Visit (AMBULATORY_SURGERY_CENTER): Payer: Medicaid Other

## 2023-06-17 VITALS — Ht <= 58 in | Wt 170.0 lb

## 2023-06-17 DIAGNOSIS — Z860100 Personal history of colon polyps, unspecified: Secondary | ICD-10-CM

## 2023-06-17 NOTE — Progress Notes (Signed)

## 2023-07-08 ENCOUNTER — Ambulatory Visit: Payer: Medicaid Other | Admitting: Gastroenterology

## 2023-07-08 ENCOUNTER — Encounter: Payer: Self-pay | Admitting: Gastroenterology

## 2023-07-08 VITALS — BP 166/92 | HR 49 | Temp 97.5°F | Resp 14 | Ht <= 58 in | Wt 170.0 lb

## 2023-07-08 DIAGNOSIS — K648 Other hemorrhoids: Secondary | ICD-10-CM

## 2023-07-08 DIAGNOSIS — I10 Essential (primary) hypertension: Secondary | ICD-10-CM | POA: Diagnosis not present

## 2023-07-08 DIAGNOSIS — E119 Type 2 diabetes mellitus without complications: Secondary | ICD-10-CM | POA: Diagnosis not present

## 2023-07-08 DIAGNOSIS — E785 Hyperlipidemia, unspecified: Secondary | ICD-10-CM | POA: Diagnosis not present

## 2023-07-08 DIAGNOSIS — Z1211 Encounter for screening for malignant neoplasm of colon: Secondary | ICD-10-CM

## 2023-07-08 DIAGNOSIS — K5730 Diverticulosis of large intestine without perforation or abscess without bleeding: Secondary | ICD-10-CM

## 2023-07-08 DIAGNOSIS — K644 Residual hemorrhoidal skin tags: Secondary | ICD-10-CM | POA: Diagnosis not present

## 2023-07-08 DIAGNOSIS — D122 Benign neoplasm of ascending colon: Secondary | ICD-10-CM

## 2023-07-08 DIAGNOSIS — Z860101 Personal history of adenomatous and serrated colon polyps: Secondary | ICD-10-CM | POA: Diagnosis not present

## 2023-07-08 DIAGNOSIS — Z860100 Personal history of colon polyps, unspecified: Secondary | ICD-10-CM

## 2023-07-08 MED ORDER — SODIUM CHLORIDE 0.9 % IV SOLN: 500.0000 mL | Freq: Once | INTRAVENOUS | Status: DC

## 2023-07-08 NOTE — Progress Notes (Signed)
 [facility] History and Physical   Primary Care Physician:  Vicci Barnie NOVAK, MD   Reason for Procedure:  History of adenomatous colon polyps  Plan:    Surveillance colonoscopy with possible interventions as needed     HPI: [name] is a very pleasant 73 y.o. female here for surveillance colonoscopy. Denies any nausea, vomiting, abdominal pain, melena or bright red blood per rectum  The risks and benefits as well as alternatives of endoscopic procedure(s) have been discussed and reviewed. All questions answered. The patient agrees to proceed.    Past Medical History:  Diagnosis Date   Arthritis    Cataract    Diabetes mellitus without complication (HCC)    Hyperlipidemia    Hypertension 2006   Malaria 09/20/2014     Past Surgical History:  Procedure Laterality Date   CATARACT EXTRACTION, BILATERAL     LT 01/31/2022, RT 12/2021   CESAREAN SECTION  1990   PTERYGIUM EXCISION Left 09/01/2020    Prior to Admission medications   Medication Sig Start Date End Date Taking? Authorizing Provider  atorvastatin  (LIPITOR) 40 MG tablet Take 1 tablet (40 mg total) by mouth daily. 03/20/23  Yes Vicci Barnie NOVAK, MD  Cholecalciferol (VITAMIN D3) 10 MCG (400 UNIT) tablet Take 1 tablet (400 Units total) by mouth daily. 04/24/19  Yes Vicci Barnie NOVAK, MD  Glucosamine-Chondroitin 750-600 MG TABS Take 2 tablets by mouth daily. 05/16/16  Yes Funches, Josalyn, MD  hydrochlorothiazide  (HYDRODIURIL ) 12.5 MG tablet Take 1 tablet (12.5 mg total) by mouth every Monday, Wednesday, and Friday for blood pressure and lower extremity swelling. 03/20/23  Yes Vicci Barnie NOVAK, MD  NIFEdipine  (PROCARDIA -XL/NIFEDICAL-XL) 30 MG 24 hr tablet Take 30 mg by mouth daily. 10/08/20  Yes [provider]  Turmeric 500 MG CAPS Take 500 mg by mouth daily. 05/16/16  Yes Funches, Josalyn, MD  Accu-Chek Softclix Lancets lancets Use to check blood sugar 1 times daily. Patient not  taking: Reported on 07/08/2023 03/20/23   Vicci Barnie NOVAK, MD  benzonatate  (TESSALON ) 100 MG capsule Take 1 capsule (100 mg total) by mouth 3 (three) times daily as needed for cough. Patient not taking: Reported on 03/20/2023 08/28/22   Vicci Barnie NOVAK, MD  Blood Glucose Monitoring Suppl (ACCU-CHEK GUIDE) w/Device KIT Use to check blood sugar 1 times daily. Patient not taking: Reported on 07/08/2023 03/20/23   Vicci Barnie NOVAK, MD  glimepiride  (AMARYL ) 2 MG tablet Take 2 mg by mouth daily with breakfast. Patient not taking: Reported on 07/08/2023 01/08/21   [provider]  glucose blood test strip Use to check blood sugar 1 times daily. Patient not taking: Reported on 07/08/2023 06/10/23   Vicci Barnie NOVAK, MD  ibuprofen (ADVIL,MOTRIN) 400 MG tablet Take 400 mg by mouth every 6 (six) hours as needed.    [provider]  valsartan  (DIOVAN ) 80 MG tablet Take 1 tablet (80 mg total) by mouth daily. Patient not taking: Reported on 07/08/2023 03/20/23   Vicci Barnie NOVAK, MD    Current Outpatient Medications  Medication Sig Dispense Refill   atorvastatin  (LIPITOR) 40 MG tablet Take 1 tablet (40 mg total) by mouth daily. 90 tablet 2   Cholecalciferol (VITAMIN D3) 10 MCG (400 UNIT) tablet Take 1 tablet (400 Units total) by mouth daily. 100 tablet 1   Glucosamine-Chondroitin 750-600 MG TABS Take 2 tablets by mouth daily.  0   hydrochlorothiazide  (HYDRODIURIL ) 12.5 MG tablet Take 1 tablet (12.5 mg total) by mouth every Monday, Wednesday, and  Friday for blood pressure and lower extremity swelling. 36 tablet 2   NIFEdipine  (PROCARDIA -XL/NIFEDICAL-XL) 30 MG 24 hr tablet Take 30 mg by mouth daily.     Turmeric 500 MG CAPS Take 500 mg by mouth daily.     Accu-Chek Softclix Lancets lancets Use to check blood sugar 1 times daily. (Patient not taking: Reported on 07/08/2023) 100 each 6   benzonatate  (TESSALON ) 100 MG capsule Take 1 capsule (100 mg total) by mouth 3 (three) times daily as needed for  cough. (Patient not taking: Reported on 03/20/2023) 30 capsule 0   Blood Glucose Monitoring Suppl (ACCU-CHEK GUIDE) w/Device KIT Use to check blood sugar 1 times daily. (Patient not taking: Reported on 07/08/2023) 1 kit 0   glimepiride  (AMARYL ) 2 MG tablet Take 2 mg by mouth daily with breakfast. (Patient not taking: Reported on 07/08/2023)     glucose blood test strip Use to check blood sugar 1 times daily. (Patient not taking: Reported on 07/08/2023) 100 each 6   ibuprofen (ADVIL,MOTRIN) 400 MG tablet Take 400 mg by mouth every 6 (six) hours as needed.     valsartan  (DIOVAN ) 80 MG tablet Take 1 tablet (80 mg total) by mouth daily. (Patient not taking: Reported on 07/08/2023) 90 tablet 1   Current Facility-Administered Medications  Medication Dose Route Frequency Provider Last Rate Last Admin   0.9 %  sodium chloride  infusion  500 mL Intravenous Once Eda Iha, MD       0.9 %  sodium chloride  infusion  500 mL Intravenous Once Leonetta Mcgivern V, MD        Allergies as of 07/08/2023   (No Known Allergies)    Family History  Problem Relation Age of Onset   Hypertension Mother    Diabetes Mother    Cancer Father        prostate   Diabetes Sister    Colon cancer Neg Hx    Esophageal cancer Neg Hx    Rectal cancer Neg Hx    Stomach cancer Neg Hx    Breast cancer Neg Hx     Social History   Socioeconomic History   Marital status: Married    Spouse name: Not on file   Number of children: 3   Years of education: 12    Highest education level: High school graduate  Occupational History   Not on file  Tobacco Use   Smoking status: Never   Smokeless tobacco: Never  Vaping Use   Vaping status: Never Used  Substance and Sexual Activity   Alcohol use: Not Currently   Drug use: No   Sexual activity: Not Currently    Birth control/protection: Post-menopausal  Other Topics Concern   Not on file  Social History Narrative   From [place]   Moved to [place]  on 09/11/2014      Has 3  children- 1 daughter in [place],    1 child in [place]   Social Drivers of Health   Financial Resource Strain: Low Risk  (03/20/2023)   Overall Financial Resource Strain (CARDIA)    Difficulty of Paying Living Expenses: Not very hard  Food Insecurity: No Food Insecurity (03/20/2023)   Hunger Vital Sign    Worried About Running Out of Food in the Last Year: Never true    Ran Out of Food in the Last Year: Never true  Transportation Needs: No Transportation Needs (03/20/2023)   PRAPARE - Transportation    Lack of Transportation (Medical): No    Lack of  Transportation (Non-Medical): No  Physical Activity: Insufficiently Active (03/20/2023)   Exercise Vital Sign    Days of Exercise per Week: 3 days    Minutes of Exercise per Session: 30 min  Stress: Stress Concern Present (03/20/2023)   [employer] of Occupational Health - Occupational Stress Questionnaire    Feeling of Stress : Rather much  Social Connections: Moderately Integrated (03/20/2023)   Social Connection and Isolation Panel [NHANES]    Frequency of Communication with Friends and Family: Twice a week    Frequency of Social Gatherings with Friends and Family: Once a week    Attends Religious Services: More than 4 times per year    Active Member of [employer] or Organizations: No    Attends Banker Meetings: Never    Marital Status: Married  Catering Manager Violence: Not At Risk (03/20/2023)   Humiliation, Afraid, Rape, and Kick questionnaire    Fear of Current or Ex-Partner: No    Emotionally Abused: No    Physically Abused: No    Sexually Abused: No    Review of Systems:  All other review of systems negative except as mentioned in the HPI.  Physical Exam: Vital signs in last 24 hours: BP 100/76   Pulse (!) 54   Temp (!) 97.5 F (36.4 C)   Ht 4' 8 (1.422 m)   Wt 170 lb (77.1 kg)   SpO2 100%   BMI 38.11 kg/m  General:   Alert, NAD Lungs:  Clear .   Heart:  Regular rate and rhythm Abdomen:  Soft,  nontender and nondistended. Neuro/Psych:  Alert and cooperative. Normal mood and affect. A and O x 3  Reviewed labs, radiology imaging, old records and pertinent past GI work up  Patient is appropriate for planned procedure(s) and anesthesia in an ambulatory setting   K. Veena Mindel Friscia , MD [phone]

## 2023-07-08 NOTE — Patient Instructions (Addendum)
 Handouts Provided:  Polyps and Diverticulosis  YOU HAD AN ENDOSCOPIC PROCEDURE TODAY AT THE [facility]:   Refer to the procedure report that was given to you for any specific questions about what was found during the examination.  If the procedure report does not answer your questions, please call your gastroenterologist to clarify.  If you requested that your care partner not be given the details of your procedure findings, then the procedure report has been included in a sealed envelope for you to review at your convenience later.  YOU SHOULD EXPECT: Some feelings of bloating in the abdomen. Passage of more gas than usual.  Walking can help get rid of the air that was put into your GI tract during the procedure and reduce the bloating. If you had a lower endoscopy (such as a colonoscopy or flexible sigmoidoscopy) you may notice spotting of blood in your stool or on the toilet paper. If you underwent a bowel prep for your procedure, you may not have a normal bowel movement for a few days.  Please Note:  You might notice some irritation and congestion in your nose or some drainage.  This is from the oxygen used during your procedure.  There is no need for concern and it should clear up in a day or so.  SYMPTOMS TO REPORT IMMEDIATELY:  Following lower endoscopy (colonoscopy or flexible sigmoidoscopy):  Excessive amounts of blood in the stool  Significant tenderness or worsening of abdominal pains  Swelling of the abdomen that is new, acute  Fever of 100F or higher  For urgent or emergent issues, a gastroenterologist can be reached at any hour by calling [phone]. Do not use MyChart messaging for urgent concerns.    DIET:  We do recommend a small meal at first, but then you may proceed to your regular diet.  Drink plenty of fluids but you should avoid alcoholic beverages for 24 hours.  ACTIVITY:  You should plan to take it easy for the rest of today and you should NOT DRIVE  or use heavy machinery until tomorrow (because of the sedation medicines used during the test).    FOLLOW UP: Our staff will call the number listed on your records the next business day following your procedure.  We will call around 7:15- 8:00 am to check on you and address any questions or concerns that you may have regarding the information given to you following your procedure. If we do not reach you, we will leave a message.     If any biopsies were taken you will be contacted by phone or by letter within the next 1-3 weeks.  Please call us at [phone] if you have not heard about the biopsies in 3 weeks.    SIGNATURES/CONFIDENTIALITY: You and/or your care partner have signed paperwork which will be entered into your electronic medical record.  These signatures attest to the fact that that the information above on your After Visit Summary has been reviewed and is understood.  Full responsibility of the confidentiality of this discharge information lies with you and/or your care-partner.

## 2023-07-08 NOTE — Progress Notes (Signed)
 Called to room to assist during endoscopic procedure.  Patient ID and intended procedure confirmed with present staff. Received instructions for my participation in the procedure from the performing physician.

## 2023-07-08 NOTE — Progress Notes (Signed)
 Pt's states no medical or surgical changes since previsit or office visit.

## 2023-07-08 NOTE — Op Note (Signed)
 [facility] Patient Name: [name] Procedure Date: 07/08/2023 10:56 AM MRN: [id number] Endoscopist: [name] , MD, [id number] Age: 73 Referring MD:  Date of Birth: 1951/01/23 Gender: Female Account #: [id number] Procedure:                Colonoscopy Indications:              High risk colon cancer surveillance: Personal                            history of colonic polyps, High risk colon cancer                            surveillance: Personal history of adenoma less than                            10 mm in size Medicines:                Monitored Anesthesia Care Procedure:                Pre-Anesthesia Assessment:                           - Prior to the procedure, a History and Physical                            was performed, and patient medications and                            allergies were reviewed. The patient's tolerance of                            previous anesthesia was also reviewed. The risks                            and benefits of the procedure and the sedation                            options and risks were discussed with the patient.                            All questions were answered, and informed consent                            was obtained. Prior Anticoagulants: The patient has                            taken no anticoagulant or antiplatelet agents. ASA                            Grade Assessment: II - A patient with mild systemic                            disease. After reviewing the risks and benefits,  the patient was deemed in satisfactory condition to                            undergo the procedure.                           After obtaining informed consent, the colonoscope                            was passed under direct vision. Throughout the                            procedure, the patient's blood pressure, pulse, and                            oxygen saturations were monitored  continuously. The                            Olympus Scope SN: [id number] was introduced through                            the anus and advanced to the the cecum, identified                            by appendiceal orifice and ileocecal valve. The                            colonoscopy was performed without difficulty. The                            patient tolerated the procedure well. The quality                            of the bowel preparation was good. The ileocecal                            valve, appendiceal orifice, and rectum were                            photographed. Scope In: 11:02:17 AM Scope Out: 11:19:27 AM Scope Withdrawal Time: 0 hours 13 minutes 35 seconds  Total Procedure Duration: 0 hours 17 minutes 10 seconds  Findings:                 The perianal and digital rectal examinations were                            normal.                           A 1 mm polyp was found in the ascending colon. The                            polyp was sessile. The polyp was removed with a  cold biopsy forceps. Resection and retrieval were                            complete.                           A few small-mouthed diverticula were found in the                            sigmoid colon.                           Non-bleeding external and internal hemorrhoids were                            found during retroflexion. The hemorrhoids were                            small. Complications:            No immediate complications. Estimated Blood Loss:     Estimated blood loss was minimal. Impression:               - One 1 mm polyp in the ascending colon, removed                            with a cold biopsy forceps. Resected and retrieved.                           - Diverticulosis in the sigmoid colon.                           - Non-bleeding external and internal hemorrhoids. Recommendation:           - Patient has a contact number available for                             emergencies. The signs and symptoms of potential                            delayed complications were discussed with the                            patient. Return to normal activities tomorrow.                            Written discharge instructions were provided to the                            patient.                           - Resume previous diet.                           - Continue present medications.                           -  Await pathology results.                           - Repeat colonoscopy in 5-10 years for surveillance                            based on pathology results. Cassandra Fader V. Reiley Bertagnolli, MD 07/08/2023 11:25:43 AM This report has been signed electronically.

## 2023-07-08 NOTE — Progress Notes (Signed)
 Vss nad trans to pacu

## 2023-07-09 ENCOUNTER — Telehealth: Payer: Self-pay

## 2023-07-09 NOTE — Telephone Encounter (Signed)
 Follow up call to pt, lm for pt to call if having any difficulty with normal activities or eating and drinking.  Also to call if any other questions or concerns.

## 2023-07-10 LAB — SURGICAL PATHOLOGY

## 2023-08-11 ENCOUNTER — Encounter: Payer: Self-pay | Admitting: Gastroenterology

## 2023-09-03 ENCOUNTER — Other Ambulatory Visit: Payer: Self-pay

## 2023-09-10 ENCOUNTER — Other Ambulatory Visit: Payer: Self-pay | Admitting: Internal Medicine

## 2023-09-10 DIAGNOSIS — Z1231 Encounter for screening mammogram for malignant neoplasm of breast: Secondary | ICD-10-CM

## 2023-09-12 DIAGNOSIS — Z419 Encounter for procedure for purposes other than remedying health state, unspecified: Secondary | ICD-10-CM | POA: Diagnosis not present

## 2023-09-14 ENCOUNTER — Other Ambulatory Visit: Payer: Self-pay

## 2023-09-14 ENCOUNTER — Other Ambulatory Visit: Payer: Self-pay | Admitting: Internal Medicine

## 2023-09-14 DIAGNOSIS — E1159 Type 2 diabetes mellitus with other circulatory complications: Secondary | ICD-10-CM

## 2023-09-14 MED ORDER — VALSARTAN 80 MG PO TABS
80.0000 mg | ORAL_TABLET | Freq: Every day | ORAL | 0 refills | Status: DC
  Filled 2023-09-14: qty 30, 30d supply, fill #0

## 2023-09-15 ENCOUNTER — Other Ambulatory Visit: Payer: Self-pay

## 2023-10-12 ENCOUNTER — Other Ambulatory Visit: Payer: Self-pay

## 2023-10-12 ENCOUNTER — Ambulatory Visit

## 2023-10-12 ENCOUNTER — Other Ambulatory Visit: Payer: Self-pay | Admitting: Internal Medicine

## 2023-10-12 ENCOUNTER — Ambulatory Visit
Admission: RE | Admit: 2023-10-12 | Discharge: 2023-10-12 | Disposition: A | Discharge status: Home / Self Care | Source: Ambulatory Visit | Attending: Internal Medicine | Admitting: Internal Medicine

## 2023-10-12 DIAGNOSIS — Z1231 Encounter for screening mammogram for malignant neoplasm of breast: Secondary | ICD-10-CM

## 2023-10-12 DIAGNOSIS — I152 Hypertension secondary to endocrine disorders: Secondary | ICD-10-CM

## 2023-10-12 DIAGNOSIS — Z419 Encounter for procedure for purposes other than remedying health state, unspecified: Secondary | ICD-10-CM | POA: Diagnosis not present

## 2023-10-12 MED ORDER — VALSARTAN 80 MG PO TABS
80.0000 mg | ORAL_TABLET | Freq: Every day | ORAL | 0 refills | Status: DC
  Filled 2023-10-12: qty 30, 30d supply, fill #0

## 2023-10-13 ENCOUNTER — Other Ambulatory Visit: Payer: Self-pay

## 2023-10-14 ENCOUNTER — Ambulatory Visit: Payer: Self-pay | Admitting: Internal Medicine

## 2023-10-19 ENCOUNTER — Other Ambulatory Visit: Payer: Self-pay

## 2023-11-12 DIAGNOSIS — Z419 Encounter for procedure for purposes other than remedying health state, unspecified: Secondary | ICD-10-CM | POA: Diagnosis not present

## 2023-11-20 ENCOUNTER — Other Ambulatory Visit: Payer: Self-pay | Admitting: Internal Medicine

## 2023-11-20 ENCOUNTER — Other Ambulatory Visit: Payer: Self-pay

## 2023-11-20 DIAGNOSIS — E1159 Type 2 diabetes mellitus with other circulatory complications: Secondary | ICD-10-CM

## 2023-11-20 DIAGNOSIS — E1169 Type 2 diabetes mellitus with other specified complication: Secondary | ICD-10-CM

## 2023-11-23 ENCOUNTER — Other Ambulatory Visit: Payer: Self-pay

## 2023-12-01 ENCOUNTER — Other Ambulatory Visit: Payer: Self-pay

## 2023-12-01 ENCOUNTER — Telehealth: Payer: Self-pay | Admitting: Internal Medicine

## 2023-12-01 DIAGNOSIS — I152 Hypertension secondary to endocrine disorders: Secondary | ICD-10-CM

## 2023-12-01 NOTE — Telephone Encounter (Unsigned)
 Copied from CRM [id number]. Topic: Clinical - Medication Refill >> Dec 01, 2023 12:07 PM Hamdi H wrote: Medication: valsartan  (DIOVAN ) 80 MG tablet  Has the patient contacted their pharmacy? No (Agent: If no, request that the patient contact the pharmacy for the refill. If patient does not wish to contact the pharmacy document the reason why and proceed with request.) (Agent: If yes, when and what did the pharmacy advise?) No refills remaining   This is the patient's preferred pharmacy:  [facility] [address] [place] [address] Phone: [phone] Fax: [phone]  Is this the correct pharmacy for this prescription? Yes If no, delete pharmacy and type the correct one.   Has the prescription been filled recently? No  Is the patient out of the medication? No  Has the patient been seen for an appointment in the last year OR does the patient have an upcoming appointment? Yes  Can we respond through MyChart? Yes  Agent: Please be advised that Rx refills may take up to 3 business days. We ask that you follow-up with your pharmacy.

## 2023-12-02 ENCOUNTER — Other Ambulatory Visit: Payer: Self-pay | Admitting: Internal Medicine

## 2023-12-02 ENCOUNTER — Other Ambulatory Visit: Payer: Self-pay

## 2023-12-02 DIAGNOSIS — E1159 Type 2 diabetes mellitus with other circulatory complications: Secondary | ICD-10-CM

## 2023-12-02 DIAGNOSIS — E1169 Type 2 diabetes mellitus with other specified complication: Secondary | ICD-10-CM

## 2023-12-03 ENCOUNTER — Other Ambulatory Visit: Payer: Self-pay

## 2023-12-03 ENCOUNTER — Other Ambulatory Visit: Payer: Self-pay | Admitting: Internal Medicine

## 2023-12-03 DIAGNOSIS — E1169 Type 2 diabetes mellitus with other specified complication: Secondary | ICD-10-CM

## 2023-12-03 DIAGNOSIS — I152 Hypertension secondary to endocrine disorders: Secondary | ICD-10-CM

## 2023-12-03 NOTE — Telephone Encounter (Signed)
 Requested medication (s) are due for refill today: yes  Requested medication (s) are on the active medication list: yes  Last refill:  10/12/23 #30/0  Future visit scheduled: yes has scheduled appt   Notes to clinic:  Unable to refill per protocol due to failed labs, no updated results.      Requested Prescriptions  Pending Prescriptions Disp Refills   valsartan  (DIOVAN ) 80 MG tablet 30 tablet 0    Sig: Take 1 tablet (80 mg total) by mouth daily. Please schedule appointment with Dr. Vicci for more refills.     Cardiovascular:  Angiotensin Receptor Blockers Failed - 12/03/2023 10:16 AM      Failed - Cr in normal range and within 180 days    Creat  Date Value Ref Range Status  05/16/2016 0.89 0.50 - 0.99 mg/dL Final    Comment:      For patients > or = 72 years of age: The upper reference limit for Creatinine is approximately 13% higher for people identified as African-American.      Creatinine, Ser  Date Value Ref Range Status  08/28/2022 0.66 0.57 - 1.00 mg/dL Final   Creatinine, Urine  Date Value Ref Range Status  05/16/2016 29 20 - 320 mg/dL Final         Failed - K in normal range and within 180 days    Potassium  Date Value Ref Range Status  08/28/2022 4.0 3.5 - 5.2 mmol/L Final         Failed - Last BP in normal range    BP Readings from Last 1 Encounters:  07/08/23 (!) 166/92         Failed - Valid encounter within last 6 months    Recent Outpatient Visits           8 months ago Diabetes mellitus type 2, diet-controlled (HCC)   [facility] [place]. [facility] Vicci Barnie NOVAK, MD   1 year ago Type 2 diabetes mellitus with obesity [facility])   [facility] Of [place]. [facility] Vicci Barnie NOVAK, MD   1 year ago Type 2 diabetes mellitus with obesity [facility])   [facility] Dept Of [place]. [facility] Vicci Barnie B, MD   2 years ago  Type 2 diabetes mellitus with obesity [facility])   [facility] Of [place]. [facility] Vicci Barnie B, MD   2 years ago Type 2 diabetes mellitus with obesity [facility])    [facility] [place]. [facility] Vicci Barnie NOVAK, MD              Passed - Patient is not pregnant

## 2023-12-08 ENCOUNTER — Other Ambulatory Visit: Payer: Self-pay | Admitting: Internal Medicine

## 2023-12-08 DIAGNOSIS — E1169 Type 2 diabetes mellitus with other specified complication: Secondary | ICD-10-CM

## 2023-12-08 DIAGNOSIS — E1159 Type 2 diabetes mellitus with other circulatory complications: Secondary | ICD-10-CM

## 2023-12-09 ENCOUNTER — Other Ambulatory Visit: Payer: Self-pay

## 2023-12-09 MED ORDER — ACCU-CHEK GUIDE W/DEVICE KIT
PACK | 0 refills | Status: AC
  Filled 2023-12-09: qty 1, fill #0

## 2023-12-09 MED ORDER — VALSARTAN 80 MG PO TABS
80.0000 mg | ORAL_TABLET | Freq: Every day | ORAL | 0 refills | Status: DC
  Filled 2023-12-09: qty 30, 30d supply, fill #0

## 2023-12-09 MED ORDER — HYDROCHLOROTHIAZIDE 12.5 MG PO TABS
12.5000 mg | ORAL_TABLET | ORAL | 1 refills | Status: DC
  Filled 2023-12-09: qty 12, 28d supply, fill #0

## 2023-12-09 MED ORDER — ATORVASTATIN CALCIUM 40 MG PO TABS
40.0000 mg | ORAL_TABLET | Freq: Every day | ORAL | 1 refills | Status: DC
  Filled 2023-12-09: qty 30, 30d supply, fill #0

## 2023-12-12 DIAGNOSIS — Z419 Encounter for procedure for purposes other than remedying health state, unspecified: Secondary | ICD-10-CM | POA: Diagnosis not present

## 2023-12-15 ENCOUNTER — Other Ambulatory Visit: Payer: Self-pay

## 2023-12-23 ENCOUNTER — Other Ambulatory Visit: Payer: Self-pay

## 2024-01-12 DIAGNOSIS — Z419 Encounter for procedure for purposes other than remedying health state, unspecified: Secondary | ICD-10-CM | POA: Diagnosis not present

## 2024-01-14 ENCOUNTER — Encounter (HOSPITAL_BASED_OUTPATIENT_CLINIC_OR_DEPARTMENT_OTHER): Payer: Self-pay | Admitting: Emergency Medicine

## 2024-01-14 ENCOUNTER — Other Ambulatory Visit: Payer: Self-pay

## 2024-01-14 ENCOUNTER — Emergency Department (HOSPITAL_BASED_OUTPATIENT_CLINIC_OR_DEPARTMENT_OTHER)

## 2024-01-14 ENCOUNTER — Emergency Department (HOSPITAL_BASED_OUTPATIENT_CLINIC_OR_DEPARTMENT_OTHER)
Admission: EM | Admit: 2024-01-14 | Discharge: 2024-01-15 | Disposition: A | Discharge status: Home / Self Care | Attending: Emergency Medicine | Admitting: Emergency Medicine

## 2024-01-14 DIAGNOSIS — Z23 Encounter for immunization: Secondary | ICD-10-CM | POA: Insufficient documentation

## 2024-01-14 DIAGNOSIS — S62633A Displaced fracture of distal phalanx of left middle finger, initial encounter for closed fracture: Secondary | ICD-10-CM | POA: Diagnosis not present

## 2024-01-14 DIAGNOSIS — S62633B Displaced fracture of distal phalanx of left middle finger, initial encounter for open fracture: Secondary | ICD-10-CM | POA: Insufficient documentation

## 2024-01-14 DIAGNOSIS — S62663B Nondisplaced fracture of distal phalanx of left middle finger, initial encounter for open fracture: Secondary | ICD-10-CM | POA: Diagnosis not present

## 2024-01-14 DIAGNOSIS — S6992XA Unspecified injury of left wrist, hand and finger(s), initial encounter: Secondary | ICD-10-CM | POA: Diagnosis present

## 2024-01-14 DIAGNOSIS — W231XXA Caught, crushed, jammed, or pinched between stationary objects, initial encounter: Secondary | ICD-10-CM | POA: Diagnosis not present

## 2024-01-14 DIAGNOSIS — S61213A Laceration without foreign body of left middle finger without damage to nail, initial encounter: Secondary | ICD-10-CM | POA: Insufficient documentation

## 2024-01-14 MED ORDER — TETANUS-DIPHTH-ACELL PERTUSSIS 5-2.5-18.5 LF-MCG/0.5 IM SUSY
0.5000 mL | PREFILLED_SYRINGE | Freq: Once | INTRAMUSCULAR | Status: AC
  Administered 2024-01-14: 0.5 mL via INTRAMUSCULAR
  Filled 2024-01-14: qty 0.5

## 2024-01-14 NOTE — ED Triage Notes (Signed)
 Pt via POV c/o laceration to left middle finger after she caught her hand in a metal garbage chute. The pad of her finger appears to be nearly severed. Pt denies thinners; last tetanus shot was over 10 years ago. Bleeding controlled in triage with gauze in place.

## 2024-01-15 ENCOUNTER — Other Ambulatory Visit: Payer: Self-pay

## 2024-01-15 MED ORDER — NAPROXEN 500 MG PO TABS
500.0000 mg | ORAL_TABLET | Freq: Two times a day (BID) | ORAL | 0 refills | Status: DC
  Filled 2024-01-15: qty 10, 5d supply, fill #0

## 2024-01-15 MED ORDER — DOXYCYCLINE HYCLATE 100 MG PO TABS
100.0000 mg | ORAL_TABLET | Freq: Once | ORAL | Status: AC
  Administered 2024-01-15: 100 mg via ORAL
  Filled 2024-01-15: qty 1

## 2024-01-15 MED ORDER — DOXYCYCLINE HYCLATE 100 MG PO CAPS
100.0000 mg | ORAL_CAPSULE | Freq: Two times a day (BID) | ORAL | 0 refills | Status: AC
  Filled 2024-01-15: qty 14, 7d supply, fill #0

## 2024-01-15 MED ORDER — LIDOCAINE HCL (PF) 1 % IJ SOLN
30.0000 mL | Freq: Once | INTRAMUSCULAR | Status: AC
  Administered 2024-01-15: 30 mL via INTRADERMAL
  Filled 2024-01-15: qty 30

## 2024-01-15 NOTE — ED Provider Notes (Signed)
 [place] [facility] Provider Note   CSN: [id number] Arrival date & time: 01/14/24  2122     Patient presents with: Laceration   [name] is a 73 y.o. female.   The history is provided by the patient and a relative.  Laceration Location:  Finger Finger laceration location:  L middle finger Length:  3.5 Depth:  Through underlying tissue Quality: jagged   Bleeding: controlled   Time since incident:  4 hours Injury mechanism: closed in a trash chute. Pain details:    Quality:  Aching   Severity:  Moderate   Timing:  Constant Foreign body present:  No foreign bodies Relieved by:  Nothing Tetanus status:  Out of date Associated symptoms: swelling   Associated symptoms: no rash        Prior to Admission medications   Medication Sig Start Date End Date Taking? Authorizing Provider  doxycycline  (VIBRAMYCIN ) 100 MG capsule Take 1 capsule (100 mg total) by mouth 2 (two) times daily. One po bid x 7 days 01/15/24  Yes Delisa Finck, MD  Accu-Chek Softclix Lancets lancets Use to check blood sugar 1 times daily. Patient not taking: Reported on 07/08/2023 03/20/23   Vicci Barnie NOVAK, MD  atorvastatin  (LIPITOR) 40 MG tablet Take 1 tablet (40 mg total) by mouth daily. 12/09/23   Vicci Barnie NOVAK, MD  benzonatate  (TESSALON ) 100 MG capsule Take 1 capsule (100 mg total) by mouth 3 (three) times daily as needed for cough. Patient not taking: Reported on 03/20/2023 08/28/22   Vicci Barnie NOVAK, MD  Blood Glucose Monitoring Suppl (ACCU-CHEK GUIDE) w/Device KIT Use to check blood sugar 1 times daily. 12/09/23   Vicci Barnie NOVAK, MD  Cholecalciferol (VITAMIN D3) 10 MCG (400 UNIT) tablet Take 1 tablet (400 Units total) by mouth daily. 04/24/19   Vicci Barnie NOVAK, MD  glimepiride  (AMARYL ) 2 MG tablet Take 2 mg by mouth daily with breakfast. Patient not taking: Reported on 07/08/2023 01/08/21   [provider]  Glucosamine-Chondroitin 750-600 MG  TABS Take 2 tablets by mouth daily. 05/16/16   Funches, Josalyn, MD  glucose blood test strip Use to check blood sugar 1 times daily. Patient not taking: Reported on 07/08/2023 06/10/23   Vicci Barnie NOVAK, MD  hydrochlorothiazide  (HYDRODIURIL ) 12.5 MG tablet Take 1 tablet (12.5 mg total) by mouth every Monday, Wednesday, and Friday for blood pressure and lower extremity swelling. 12/09/23   Vicci Barnie NOVAK, MD  ibuprofen (ADVIL,MOTRIN) 400 MG tablet Take 400 mg by mouth every 6 (six) hours as needed.    [provider]  NIFEdipine  (PROCARDIA -XL/NIFEDICAL-XL) 30 MG 24 hr tablet Take 30 mg by mouth daily. 10/08/20   [provider]  Turmeric 500 MG CAPS Take 500 mg by mouth daily. 05/16/16   Funches, Josalyn, MD  valsartan  (DIOVAN ) 80 MG tablet Take 1 tablet (80 mg total) by mouth daily. 12/09/23   Vicci Barnie NOVAK, MD    Allergies: Patient has no known allergies.    Review of Systems  Skin:  Negative for rash.  All other systems reviewed and are negative.   Updated Vital Signs BP (!) 194/89 (BP Location: Right Arm)   Pulse 65   Temp 98.4 F (36.9 C) (Oral)   Resp 18   Ht 5' (1.524 m)   Wt 77.1 kg   SpO2 99%   BMI 33.20 kg/m   Physical Exam Vitals and nursing note reviewed.  Constitutional:      General: She is  not in acute distress.    Appearance: Normal appearance. She is well-developed.  HENT:     Head: Normocephalic and atraumatic.     Nose: Nose normal.  Eyes:     Pupils: Pupils are equal, round, and reactive to light.  Cardiovascular:     Rate and Rhythm: Normal rate and regular rhythm.     Pulses: Normal pulses.     Heart sounds: Normal heart sounds.  Pulmonary:     Effort: Pulmonary effort is normal. No respiratory distress.     Breath sounds: Normal breath sounds.  Abdominal:     General: Bowel sounds are normal. There is no distension.     Palpations: Abdomen is soft.     Tenderness: There is no abdominal tenderness. There is no guarding or  rebound.  Musculoskeletal:        General: Normal range of motion.       Hands:     Cervical back: Normal range of motion and neck supple.  Skin:    General: Skin is warm and dry.     Capillary Refill: Capillary refill takes less than 2 seconds.     Findings: No erythema or rash.  Neurological:     General: No focal deficit present.     Mental Status: She is alert and oriented to person, place, and time.     Deep Tendon Reflexes: Reflexes normal.  Psychiatric:        Mood and Affect: Mood normal.        Behavior: Behavior normal.     (all labs ordered are listed, but only abnormal results are displayed) Labs Reviewed - No data to display  EKG: None  Radiology: DG Finger Middle Left Result Date: 01/14/2024 CLINICAL DATA:  Crush injury to the third digit, initial encounter EXAM: LEFT MIDDLE FINGER 2+V COMPARISON:  None Available. FINDINGS: Comminuted fracture of the third distal phalangeal tuft is noted. Mild ventral displacement of the distal fracture fragment is noted. Associated soft tissue injury is noted. IMPRESSION: Third distal phalangeal tuft fracture with associated soft tissue irregularity. Electronically Signed   By: Oneil Devonshire M.D.   On: 01/14/2024 22:30     .Laceration Repair  Date/Time: 01/15/2024 2:10 AM  Performed by: Nettie Earing, MD Authorized by: Nettie Earing, MD   Consent:    Consent obtained:  Verbal   Consent given by:  Patient   Risks discussed:  Need for additional repair, nerve damage, infection, poor cosmetic result, poor wound healing, vascular damage and tendon damage   Alternatives discussed:  Delayed treatment Universal protocol:    Patient identity confirmed:  Arm band Anesthesia:    Anesthesia method:  Local infiltration   Local anesthetic:  Bupivacaine 0.5% w/o epi Laceration details:    Location:  Finger   Finger location:  L long finger   Length (cm):  3.5 (2 lacerations 1 is 1 cm and the other 2.5)   Depth (mm):   1 Exploration:    Limited defect created (wound extended): no     Hemostasis achieved with:  Direct pressure   Wound extent: fascia not violated and no foreign body   Treatment:    Area cleansed with:  Povidone-iodine, chlorhexidine, saline and Shur-Clens   Amount of cleaning:  Extensive   Irrigation solution:  Sterile saline   Irrigation method:  Syringe   Undermining:  Minimal   Scar revision: no   Skin repair:    Repair method:  Sutures   Suture size:  5-0 (4 vicryl)   Suture material:  Nylon   Suture technique:  Simple interrupted   Number of sutures:  11 Approximation:    Approximation:  Close (under nail loose) Repair type:    Repair type:  Complex Post-procedure details:    Dressing:  Sterile dressing (and splint)   Procedure completion:  Tolerated well, no immediate complications Comments:     Tissue under nail is macerated and loosely approximated and tore through multiple times     Medications Ordered in the ED  Tdap (BOOSTRIX ) injection 0.5 mL (0.5 mLs Intramuscular Given 01/14/24 2330)  doxycycline  (VIBRA -TABS) tablet 100 mg (100 mg Oral Given 01/15/24 0042)  lidocaine  (PF) (XYLOCAINE ) 1 % injection 30 mL (30 mLs Intradermal Given by Other 01/15/24 0043)                                    Medical Decision Making Patient with finger closed in trash chute   Amount and/or Complexity of Data Reviewed Independent Historian:     Details: Daughter see above  External Data Reviewed: notes.    Details: Previous notes reviewed  Radiology: ordered and independent interpretation performed.    Details: Tuft fracture  Discussion of management or test interpretation with external provider(s): 1218 case d/w Dr. Murrell, attempt approximation to bring together as best as possible and doxycycline  and close follow up in office   Risk Prescription drug management. Risk Details: Patient instructed to keep clean and dry at all times.  No submersion in any body of water for 3  weeks.  Take all antibiotics and call Dr. Kuzma for close follow up.  Patient verbalizes understanding and agrees to follow up.      Final diagnoses:  None   No signs of systemic illness or infection. The patient is nontoxic-appearing on exam and vital signs are within normal limits.  I have reviewed the triage vital signs and the nursing notes. Pertinent labs & imaging results that were available during my care of the patient were reviewed by me and considered in my medical decision making (see chart for details). After history, exam, and medical workup I feel the patient has been appropriately medically screened and is safe for discharge home. Pertinent diagnoses were discussed with the patient. Patient was given return precautions.  ED Discharge Orders          Ordered    doxycycline  (VIBRAMYCIN ) 100 MG capsule  2 times daily        01/15/24 0030               Romina Divirgilio, MD 01/15/24 9759

## 2024-01-15 NOTE — Discharge Instructions (Signed)
 Take all antibiotics.  Keep clean and dry no submersion in any body of water for 3 weeks.

## 2024-01-15 NOTE — ED Notes (Signed)
 Nonstick pad pressure dressing with finger splint. Kling with coban to hold splint in place.

## 2024-01-20 ENCOUNTER — Telehealth: Payer: Self-pay | Admitting: Internal Medicine

## 2024-01-20 DIAGNOSIS — M25642 Stiffness of left hand, not elsewhere classified: Secondary | ICD-10-CM | POA: Diagnosis not present

## 2024-01-20 DIAGNOSIS — M79642 Pain in left hand: Secondary | ICD-10-CM | POA: Diagnosis not present

## 2024-01-20 DIAGNOSIS — S67193A Crushing injury of left middle finger, initial encounter: Secondary | ICD-10-CM | POA: Diagnosis not present

## 2024-01-20 DIAGNOSIS — S61313A Laceration without foreign body of left middle finger with damage to nail, initial encounter: Secondary | ICD-10-CM | POA: Diagnosis not present

## 2024-01-20 NOTE — Telephone Encounter (Signed)
 Confirmed appt for 8/21

## 2024-01-21 ENCOUNTER — Other Ambulatory Visit: Payer: Self-pay

## 2024-01-21 ENCOUNTER — Encounter: Payer: Self-pay | Admitting: Internal Medicine

## 2024-01-21 ENCOUNTER — Ambulatory Visit: Attending: Internal Medicine | Admitting: Internal Medicine

## 2024-01-21 VITALS — BP 179/78 | HR 63 | Temp 98.4°F | Ht 60.0 in | Wt 174.0 lb

## 2024-01-21 DIAGNOSIS — E785 Hyperlipidemia, unspecified: Secondary | ICD-10-CM

## 2024-01-21 DIAGNOSIS — I152 Hypertension secondary to endocrine disorders: Secondary | ICD-10-CM | POA: Diagnosis not present

## 2024-01-21 DIAGNOSIS — E1169 Type 2 diabetes mellitus with other specified complication: Secondary | ICD-10-CM

## 2024-01-21 DIAGNOSIS — S6710XD Crushing injury of unspecified finger(s), subsequent encounter: Secondary | ICD-10-CM | POA: Diagnosis not present

## 2024-01-21 DIAGNOSIS — S6710XA Crushing injury of unspecified finger(s), initial encounter: Secondary | ICD-10-CM

## 2024-01-21 DIAGNOSIS — E1159 Type 2 diabetes mellitus with other circulatory complications: Secondary | ICD-10-CM | POA: Diagnosis not present

## 2024-01-21 DIAGNOSIS — E210 Primary hyperparathyroidism: Secondary | ICD-10-CM | POA: Diagnosis not present

## 2024-01-21 DIAGNOSIS — E559 Vitamin D deficiency, unspecified: Secondary | ICD-10-CM

## 2024-01-21 DIAGNOSIS — E119 Type 2 diabetes mellitus without complications: Secondary | ICD-10-CM

## 2024-01-21 LAB — GLUCOSE, POCT (MANUAL RESULT ENTRY): POC Glucose: 111 mg/dL — AB (ref 70–99)

## 2024-01-21 LAB — POCT GLYCOSYLATED HEMOGLOBIN (HGB A1C): HbA1c, POC (controlled diabetic range): 6.4 % (ref 0.0–7.0)

## 2024-01-21 MED ORDER — HYDROCHLOROTHIAZIDE 12.5 MG PO TABS
12.5000 mg | ORAL_TABLET | ORAL | 1 refills | Status: AC
  Filled 2024-01-21: qty 36, 84d supply, fill #0
  Filled 2024-04-06: qty 36, 84d supply, fill #1

## 2024-01-21 MED ORDER — VALSARTAN 80 MG PO TABS
80.0000 mg | ORAL_TABLET | Freq: Every day | ORAL | 1 refills | Status: AC
  Filled 2024-01-21: qty 90, 90d supply, fill #0
  Filled 2024-04-06: qty 90, 90d supply, fill #1

## 2024-01-21 MED ORDER — ATORVASTATIN CALCIUM 40 MG PO TABS
40.0000 mg | ORAL_TABLET | Freq: Every day | ORAL | 1 refills | Status: AC
  Filled 2024-01-21: qty 90, 90d supply, fill #0
  Filled 2024-04-06: qty 90, 90d supply, fill #1

## 2024-01-21 MED ORDER — AMLODIPINE BESYLATE 5 MG PO TABS
5.0000 mg | ORAL_TABLET | Freq: Every day | ORAL | 1 refills | Status: DC
  Filled 2024-01-21: qty 90, 90d supply, fill #0

## 2024-01-21 NOTE — Patient Instructions (Signed)
 VISIT SUMMARY:  You had a follow-up appointment to review your diabetes, hypertension, hyperlipidemia, hyperparathyroidism, and recent finger injury. Your diabetes is well-controlled with diet, and your blood sugar levels are within the normal range. Your blood pressure has been elevated recently, possibly due to your finger injury and the use of naproxen  for pain. Your hyperlipidemia and hyperparathyroidism are being managed with medication and supplements. We also discussed your recent finger injury and the need for further blood tests and a diabetic eye exam.  YOUR PLAN:  -TYPE 2 DIABETES MELLITUS, DIET CONTROLLED: Type 2 diabetes is a condition where your body does not use insulin  properly, leading to high blood sugar levels. Your diabetes is well-controlled with diet, and your current A1c is 6.4%, which is below the target of 7%. We will submit a referral for a diabetic eye exam.  -HYPERTENSION WITH RECENT ELEVATION: Hypertension is high blood pressure. Your blood pressure has been elevated recently, possibly due to naproxen  use. We are adding amlodipine  5 mg daily to your current regimen and advising you to use Tylenol  instead of Naproxen  for pain. Please check your blood pressure twice a week for the next month and limit your salt intake. We will schedule a follow-up with a clinical pharmacist in one month to recheck your blood pressure.  -HYPERLIPIDEMIA: Hyperlipidemia is high levels of fats (lipids) in the blood. It is being managed with atorvastatin  40 mg daily.  -HYPERPARATHYROIDISM WITH OSTEOPENIA: Hyperparathyroidism is a condition where your parathyroid  glands produce too much hormone, leading to weak bones (osteopenia). You are taking 3000 IU of vitamin D  daily to manage this. We will order blood tests for kidney and liver function, blood count, cholesterol, and vitamin D  level.  -LEFT MIDDLE FINGER CRUSH INJURY: You sustained a crush injury to your left middle finger on January 14, 2024.  Naproxen  was prescribed for pain but may have contributed to elevated blood pressure. Use Tylenol  instead. This can be purchased over the counter.  -VITAMIN D  DEFICIENCY: Vitamin D  deficiency means you have low levels of vitamin D , which is important for bone health. You are taking 3000 IU of vitamin D  daily to manage this.  -GENERAL HEALTH MAINTENANCE: You are due for a diabetic eye exam and a flu shot. We will submit a referral for the eye exam. Please schedule a flu shot appointment next month or get it at an outside pharmacy and inform our office.  INSTRUCTIONS:  Please check your blood pressure twice a week for the next month and limit your salt intake. We will schedule a follow-up with a clinical pharmacist in one month to recheck your blood pressure. Additionally, please schedule a diabetic eye exam and a flu shot appointment next month or get the flu shot at an outside pharmacy and inform our office.

## 2024-01-21 NOTE — Progress Notes (Signed)
 Patient ID: [name], female    DOB: 01/20/1951  MRN: [id number]  CC: Diabetes (DM f/u. Med refills. /No questions / concerns)   Subjective: [name] is a 73 y.o. female who presents for chronic ds management. Her concerns today include:  Patient with history of DM, HTN, HL, OA, hyperPTH, osteopenia.   Discussed the use of AI scribe software for clinical note transcription with the patient, who gave verbal consent to proceed.  History of Present Illness [name] is a 73 year old female with diabetes, hypertension, hyperlipidemia, and hyperparathyroidism who presents for follow-up.  DM: Results for orders placed or performed in visit on 01/21/24  POCT glucose (manual entry)   Collection Time: 01/21/24  9:00 AM  Result Value Ref Range   POC Glucose 111 (A) 70 - 99 mg/dl  POCT glycosylated hemoglobin (Hb A1C)   Collection Time: 01/21/24  9:18 AM  Result Value Ref Range   Hemoglobin A1C     HbA1c POC (<> result, manual entry)     HbA1c, POC (prediabetic range)     HbA1c, POC (controlled diabetic range) 6.4 0.0 - 7.0 %  Her diabetes is managed through diet control, with a recent A1c of 6.4, down from 6.5 last October. Her blood sugar was 111 today. She maintains her diet by avoiding sugary drinks and snacks, though she occasionally indulges, such as having cake on her birthday. She has not been exercising regularly for the past four weeks due to relocation and a busy road, but plans to resume walking two miles daily once her finger injury heals.  She sustained a crush injury to her left middle finger on August 14th when a dumpster door landed on it. She was seen in urgent care the day after and by orthopedics yesterday, who splinted the finger and referred her for occupational therapy. She reports dizziness and last took naproxen  for pain two days ago.  HTN: Her blood pressure has been elevated since the finger injury, with readings as high as 200. She  is currently on valsartan  80 mg daily and hydrochlorothiazide  12.5 mg three times a week and took her medicines already for this morning. She reports a blood pressure of 132/83 two weeks ago when she last had a headache. She does not check her blood pressure often. Denies CP/SOB/LE edema  HL: She is on atorvastatin  40 mg for hyperlipidemia.   She also has hyperparathyroidism with osteopenia and takes vitamin D  3000 IU daily. Her last chemistry panel was in March of last year. Last bone density was 02/2023.  HM: Due for diabetic eye exam and flu shot.  We will have the flu shot probably by the middle of next month.  Patient told she can either call at that time to schedule a visit with the RN to get flu vaccine or she can get it from any outside pharmacy    Patient Active Problem List   Diagnosis Date Noted   Osteopenia of neck of right femur 12/09/2019   History of cataract 12/09/2019   Screening breast examination 07/07/2019   Primary hyperparathyroidism (HCC) 04/24/2019   Adenomatous polyp of colon 07/29/2018   Scarring of skin 07/16/2017   Breast calcification, left 07/16/2017   Immunization due 04/03/2017   Hyperlipidemia associated with type 2 diabetes mellitus (HCC) 05/18/2016   Primary osteoarthritis involving multiple joints 05/16/2016   Diabetes type 2, controlled (HCC) 03/13/2015   Ptosis, both eyelids 03/13/2015   Bilateral cataracts 03/13/2015   Hypertension 10/06/2014  Current Outpatient Medications on File Prior to Visit  Medication Sig Dispense Refill   Blood Glucose Monitoring Suppl (ACCU-CHEK GUIDE) w/Device KIT Use to check blood sugar 1 times daily. 1 kit 0   Cholecalciferol (VITAMIN D3) 10 MCG (400 UNIT) tablet Take 1 tablet (400 Units total) by mouth daily. 100 tablet 1   doxycycline  (VIBRAMYCIN ) 100 MG capsule Take 1 capsule (100 mg total) by mouth 2 (two) times daily for 7 days. 14 capsule 0   Glucosamine-Chondroitin 750-600 MG TABS Take 2 tablets by mouth  daily.  0   glucose blood test strip Use to check blood sugar 1 times daily. 100 each 6   Turmeric 500 MG CAPS Take 500 mg by mouth daily.     Accu-Chek Softclix Lancets lancets Use to check blood sugar 1 times daily. (Patient not taking: Reported on 01/21/2024) 100 each 6   Current Facility-Administered Medications on File Prior to Visit  Medication Dose Route Frequency Provider Last Rate Last Admin   0.9 %  sodium chloride  infusion  500 mL Intravenous Once Eda Iha, MD        No Known Allergies  Social History   Socioeconomic History   Marital status: Married    Spouse name: Not on file   Number of children: 3   Years of education: 12    Highest education level: High school graduate  Occupational History   Not on file  Tobacco Use   Smoking status: Never   Smokeless tobacco: Never  Vaping Use   Vaping status: Never Used  Substance and Sexual Activity   Alcohol use: Not Currently   Drug use: No   Sexual activity: Not Currently    Birth control/protection: Post-menopausal  Other Topics Concern   Not on file  Social History Narrative   From [place]   Moved to [place]  on 09/11/2014      Has 3 children- 1 daughter in [place],    1 child in [place]   Social Drivers of Health   Financial Resource Strain: Low Risk  (03/20/2023)   Overall Financial Resource Strain (CARDIA)    Difficulty of Paying Living Expenses: Not very hard  Food Insecurity: No Food Insecurity (03/20/2023)   Hunger Vital Sign    Worried About Running Out of Food in the Last Year: Never true    Ran Out of Food in the Last Year: Never true  Transportation Needs: No Transportation Needs (03/20/2023)   PRAPARE - Administrator, Civil Service (Medical): No    Lack of Transportation (Non-Medical): No  Physical Activity: Insufficiently Active (03/20/2023)   Exercise Vital Sign    Days of Exercise per Week: 3 days    Minutes of Exercise per Session: 30 min  Stress: Stress Concern Present (03/20/2023)    [employer] of Occupational Health - Occupational Stress Questionnaire    Feeling of Stress : Rather much  Social Connections: Moderately Integrated (03/20/2023)   Social Connection and Isolation Panel    Frequency of Communication with Friends and Family: Twice a week    Frequency of Social Gatherings with Friends and Family: Once a week    Attends Religious Services: More than 4 times per year    Active Member of [employer] or Organizations: No    Attends Banker Meetings: Never    Marital Status: Married  Catering manager Violence: Not At Risk (03/20/2023)   Humiliation, Afraid, Rape, and Kick questionnaire    Fear of Current or Ex-Partner: No  Emotionally Abused: No    Physically Abused: No    Sexually Abused: No    Family History  Problem Relation Age of Onset   Hypertension Mother    Diabetes Mother    Cancer Father        prostate   Diabetes Sister    Colon cancer Neg Hx    Esophageal cancer Neg Hx    Rectal cancer Neg Hx    Stomach cancer Neg Hx    Breast cancer Neg Hx     Past Surgical History:  Procedure Laterality Date   CATARACT EXTRACTION, BILATERAL     LT 01/31/2022, RT 12/2021   CESAREAN SECTION  1990   PTERYGIUM EXCISION Left 09/01/2020    ROS: Review of Systems Negative except as stated above  PHYSICAL EXAM: BP (!) 179/78   Pulse 63   Temp 98.4 F (36.9 C)   Ht 5' (1.524 m)   Wt 174 lb (78.9 kg)   SpO2 100%   BMI 33.98 kg/m   Wt Readings from Last 3 Encounters:  01/21/24 174 lb (78.9 kg)  01/14/24 170 lb (77.1 kg)  07/08/23 170 lb (77.1 kg)    Physical Exam  General appearance - alert, well appearing, elderly female and in no distress Mental status - normal mood, behavior, speech, dress, motor activity, and thought processes Neck - supple, no significant adenopathy Chest - clear to auscultation, no wheezes, rales or rhonchi, symmetric air entry Heart - RRR 1/6 SEM LT and RT upper sternal border Musculoskeletal -left  hand: Patient wearing a splint on the left middle finger.  I did not remove it.   Extremities - peripheral pulses normal, no pedal edema, no clubbing or cyanosis      Latest Ref Rng & Units 08/28/2022    4:38 PM 02/21/2022    9:12 AM 10/10/2021   10:39 AM  CMP  Glucose 70 - 99 mg/dL 87  885  97   BUN 8 - 27 mg/dL 11  14  17    Creatinine 0.57 - 1.00 mg/dL 9.33  9.24  9.16   Sodium 134 - 144 mmol/L 138  143  141   Potassium 3.5 - 5.2 mmol/L 4.0  4.7  4.4   Chloride 96 - 106 mmol/L 101  106  108   CO2 20 - 29 mmol/L 24  24  21    Calcium  8.7 - 10.3 mg/dL 89.9  88.9  88.8   Total Protein 6.0 - 8.5 g/dL 7.1   7.7   Total Bilirubin 0.0 - 1.2 mg/dL 0.5   0.7   Alkaline Phos 44 - 121 IU/L 60   88   AST 0 - 40 IU/L 30   25   ALT 0 - 32 IU/L 20   18    Lipid Panel     Component Value Date/Time   CHOL 143 03/20/2023 1144   TRIG 90 03/20/2023 1144   HDL 59 03/20/2023 1144   CHOLHDL 2.4 03/20/2023 1144   CHOLHDL 3.3 05/16/2016 0928   VLDL 21 05/16/2016 0928   LDLCALC 67 03/20/2023 1144    CBC    Component Value Date/Time   WBC 6.3 03/20/2023 1144   WBC 5.2 05/16/2016 0944   RBC 4.45 03/20/2023 1144   RBC 4.72 05/16/2016 0944   HGB 12.9 03/20/2023 1144   HCT 40.0 03/20/2023 1144   PLT 265 03/20/2023 1144   MCV 90 03/20/2023 1144   MCH 29.0 03/20/2023 1144   MCH 29.7 05/16/2016 0944  MCHC 32.3 03/20/2023 1144   MCHC 34.2 05/16/2016 0944   RDW 13.2 03/20/2023 1144   LYMPHSABS 2.0 11/26/2017 0913   MONOABS 0.8 10/06/2014 1049   EOSABS 0.1 11/26/2017 0913   BASOSABS 0.1 11/26/2017 0913    ASSESSMENT AND PLAN: 1. Diabetes mellitus type 2, diet-controlled (HCC) (Primary) Commended her on this.  Encouraged her to continue healthy eating habits.  She plans to resume walking once the finger on her left hand heals up. - POCT glycosylated hemoglobin (Hb A1C) - POCT glucose (manual entry) - Comprehensive metabolic panel with GFR - Ambulatory referral to Ophthalmology - CBC  2.  Hypertension associated with diabetes (HCC) Not at goal.  She reports that she has not been checking blood pressure consistently. -Advised to stop Naprosyn  and use Tylenol  instead for pain as nonsteroidals can sometimes elevate blood pressure. -Continue valsartan  80 mg daily and hydrochlorothiazide  12.5 mg 3 times a week.  Add Norvasc  5 mg daily. -Check blood sugars 2 times a week with goal being 130/80 or lower.  Record readings and bring them with her in 1 month to see the clinical pharmacist. - valsartan  (DIOVAN ) 80 MG tablet; Take 1 tablet (80 mg total) by mouth daily.  Dispense: 90 tablet; Refill: 1 - hydrochlorothiazide  (HYDRODIURIL ) 12.5 MG tablet; Take 1 tablet (12.5 mg total) by mouth every Monday, Wednesday, and Friday for blood pressure and lower extremity swelling.  Dispense: 36 tablet; Refill: 1 - amLODipine  (NORVASC ) 5 MG tablet; Take 1 tablet (5 mg total) by mouth daily.  Dispense: 90 tablet; Refill: 1  3. Hyperlipidemia associated with type 2 diabetes mellitus (HCC) - atorvastatin  (LIPITOR) 40 MG tablet; Take 1 tablet (40 mg total) by mouth daily.  Dispense: 90 tablet; Refill: 1 - Lipid panel  4. Crushing injury of finger, initial encounter Patient has already been seen and evaluated by orthopedics.  She is wearing a splint on the finger.  Orthopedics has referred her to OT.  5. Vitamin D  deficiency Continue vitamin D  supplement 3000 international units daily. - VITAMIN D  25 Hydroxy (Vit-D Deficiency, Fractures)  6. Primary hyperparathyroidism (HCC) Last chemistry revealed normal calcium  level.  We will check chemistry today along with parathyroid  hormone level. - Parathyroid  hormone, intact (no Ca)  Patient was given the opportunity to ask questions.  Patient verbalized understanding of the plan and was able to repeat key elements of the plan.   This documentation was completed using Paediatric nurse.  Any transcriptional errors are  unintentional.  Orders Placed This Encounter  Procedures   Comprehensive metabolic panel with GFR   CBC   Lipid panel   VITAMIN D  25 Hydroxy (Vit-D Deficiency, Fractures)   Parathyroid  hormone, intact (no Ca)   Ambulatory referral to Ophthalmology   POCT glycosylated hemoglobin (Hb A1C)   POCT glucose (manual entry)     Requested Prescriptions   Signed Prescriptions Disp Refills   valsartan  (DIOVAN ) 80 MG tablet 90 tablet 1    Sig: Take 1 tablet (80 mg total) by mouth daily.   atorvastatin  (LIPITOR) 40 MG tablet 90 tablet 1    Sig: Take 1 tablet (40 mg total) by mouth daily.   hydrochlorothiazide  (HYDRODIURIL ) 12.5 MG tablet 36 tablet 1    Sig: Take 1 tablet (12.5 mg total) by mouth every Monday, Wednesday, and Friday for blood pressure and lower extremity swelling.   amLODipine  (NORVASC ) 5 MG tablet 90 tablet 1    Sig: Take 1 tablet (5 mg total) by mouth daily.  Return in about 4 months (around 05/22/2024) for BP check Luke in 4 wks.  Barnie Louder, MD, FACP

## 2024-01-22 NOTE — Telephone Encounter (Signed)
 Copied from CRM #[id number]. Topic: Referral - Question >> Jan 22, 2024  1:39 PM Santiya F wrote: Reason for CRM: [facility] is calling in because they received a referral for this patient and they say the note says she had cataract surgery, but she didn't have it there so they were confused on why the referral was being sent to them.

## 2024-01-23 ENCOUNTER — Ambulatory Visit: Payer: Self-pay | Admitting: Internal Medicine

## 2024-01-23 LAB — VITAMIN D 25 HYDROXY (VIT D DEFICIENCY, FRACTURES): Vit D, 25-Hydroxy: 44.4 ng/mL (ref 30.0–100.0)

## 2024-01-23 LAB — COMPREHENSIVE METABOLIC PANEL WITH GFR
ALT: 19 IU/L (ref 0–32)
AST: 27 IU/L (ref 0–40)
Albumin: 4.2 g/dL (ref 3.8–4.8)
Alkaline Phosphatase: 72 IU/L (ref 44–121)
BUN/Creatinine Ratio: 23 (ref 12–28)
BUN: 21 mg/dL (ref 8–27)
Bilirubin Total: 0.6 mg/dL (ref 0.0–1.2)
CO2: 21 mmol/L (ref 20–29)
Calcium: 10.8 mg/dL — ABNORMAL HIGH (ref 8.7–10.3)
Chloride: 104 mmol/L (ref 96–106)
Creatinine, Ser: 0.91 mg/dL (ref 0.57–1.00)
Globulin, Total: 3.1 g/dL (ref 1.5–4.5)
Glucose: 109 mg/dL — ABNORMAL HIGH (ref 70–99)
Potassium: 4.3 mmol/L (ref 3.5–5.2)
Sodium: 139 mmol/L (ref 134–144)
Total Protein: 7.3 g/dL (ref 6.0–8.5)
eGFR: 67 mL/min/1.73 (ref >59)

## 2024-01-23 LAB — LIPID PANEL
Chol/HDL Ratio: 2 ratio (ref 0.0–4.4)
Cholesterol, Total: 117 mg/dL (ref 100–199)
HDL: 58 mg/dL (ref >39)
LDL Chol Calc (NIH): 47 mg/dL (ref 0–99)
Triglycerides: 53 mg/dL (ref 0–149)
VLDL Cholesterol Cal: 12 mg/dL (ref 5–40)

## 2024-01-23 LAB — CBC
Hematocrit: 37.5 % (ref 34.0–46.6)
Hemoglobin: 12 g/dL (ref 11.1–15.9)
MCH: 29.3 pg (ref 26.6–33.0)
MCHC: 32 g/dL (ref 31.5–35.7)
MCV: 92 fL (ref 79–97)
Platelets: 252 x10E3/uL (ref 150–450)
RBC: 4.09 x10E6/uL (ref 3.77–5.28)
RDW: 13.6 % (ref 11.7–15.4)
WBC: 7.8 x10E3/uL (ref 3.4–10.8)

## 2024-01-23 LAB — PARATHYROID HORMONE, INTACT (NO CA): PTH: 66 pg/mL — ABNORMAL HIGH (ref 15–65)

## 2024-01-26 NOTE — Telephone Encounter (Signed)
 Noted

## 2024-01-27 DIAGNOSIS — S67193A Crushing injury of left middle finger, initial encounter: Secondary | ICD-10-CM | POA: Diagnosis not present

## 2024-01-27 DIAGNOSIS — S61313A Laceration without foreign body of left middle finger with damage to nail, initial encounter: Secondary | ICD-10-CM | POA: Diagnosis not present

## 2024-02-12 DIAGNOSIS — Z419 Encounter for procedure for purposes other than remedying health state, unspecified: Secondary | ICD-10-CM | POA: Diagnosis not present

## 2024-02-24 DIAGNOSIS — S67193A Crushing injury of left middle finger, initial encounter: Secondary | ICD-10-CM | POA: Diagnosis not present

## 2024-02-24 DIAGNOSIS — S61313A Laceration without foreign body of left middle finger with damage to nail, initial encounter: Secondary | ICD-10-CM | POA: Diagnosis not present

## 2024-02-24 DIAGNOSIS — E119 Type 2 diabetes mellitus without complications: Secondary | ICD-10-CM | POA: Diagnosis not present

## 2024-03-02 ENCOUNTER — Telehealth: Payer: Self-pay | Admitting: Internal Medicine

## 2024-03-02 NOTE — Telephone Encounter (Signed)
 Confirmed appt for 10/2

## 2024-03-03 ENCOUNTER — Other Ambulatory Visit: Payer: Self-pay

## 2024-03-03 ENCOUNTER — Ambulatory Visit: Attending: Internal Medicine | Admitting: Pharmacist

## 2024-03-03 ENCOUNTER — Encounter: Payer: Self-pay | Admitting: Pharmacist

## 2024-03-03 VITALS — BP 171/78 | HR 56

## 2024-03-03 DIAGNOSIS — I10 Essential (primary) hypertension: Secondary | ICD-10-CM | POA: Diagnosis not present

## 2024-03-03 MED ORDER — AMLODIPINE BESYLATE 10 MG PO TABS
10.0000 mg | ORAL_TABLET | Freq: Every day | ORAL | 1 refills | Status: AC
  Filled 2024-03-03 – 2024-05-30 (×2): qty 90, 90d supply, fill #0

## 2024-03-03 NOTE — Progress Notes (Signed)
 S:     No chief complaint on file.  73 y.o. female who presents for hypertension evaluation, education, and management.   Patient was referred and last seen by Primary Care Provider, Dr. Vicci, on 01/21/2024. At last visit, BP was 179/78 mmHg.   PMH is significant for T2DM, HTN, hyperlipidemia, and hyperparathyroidism.   Today, patient arrives in good spirits and presents without assistance. Denies dizziness, headache, blurred vision, swelling. Of note, she reported to Dr. Vicci in August that she sustained a crush injury to her left middle finger on August 14th when a dumpster door landed on it. Finger was splented and she was referred to occupational therapy. Was taking naproxen  for the pain. Endorsed high blood pressure since sustaining the injury. She was advised to transition to PO Tylenol  for pain and referred to me for BP check. Amlodipine  5 mg daily was also added.   Family/Social history:  Fhx: HNT, DM Tobacco: never smoker  Alcohol: none reported  Medication adherence reported. Patient reports taking blood pressure medications today.   Current antihypertensives include: amlodipine  5 mg daily, hydrochlorothiazide  12.5 mg daily, valsartan  80 mg daily  Reported home blood pressure readings: recalls 120s-130s  Patient reported dietary habits:  -Compliant with salt restriction  -Drinks tea and water. Only drinks tea in the morning.  Patient-reported exercise habits:  -30-40 minutes daily (walks 2 miles) -2x weekly   O:  Vitals:   03/03/24 0959  BP: (!) 171/78  Pulse: (!) 56     Last 3 Office BP readings: BP Readings from Last 3 Encounters:  03/03/24 (!) 171/78  01/21/24 (!) 179/78  01/14/24 (!) 194/89   BMET    Component Value Date/Time   NA 139 01/21/2024 0949   K 4.3 01/21/2024 0949   CL 104 01/21/2024 0949   CO2 21 01/21/2024 0949   GLUCOSE 109 (H) 01/21/2024 0949   GLUCOSE 129 (H) 05/16/2016 0928   BUN 21 01/21/2024 0949   CREATININE 0.91  01/21/2024 0949   CREATININE 0.89 05/16/2016 0928   CALCIUM  10.8 (H) 01/21/2024 0949   GFRNONAA 78 06/15/2020 1042   GFRNONAA 69 05/16/2016 0928   GFRAA 90 06/15/2020 1042   GFRAA 79 05/16/2016 0928    Renal function: CrCl cannot be calculated (Patient's most recent lab result is older than the maximum 21 days allowed.).  Clinical ASCVD: No  The ASCVD Risk score (Arnett DK, et al., 2019) failed to calculate for the following reasons:   The valid total cholesterol range is 130 to 320 mg/dL  Patient is participating in a Managed Medicaid Plan:  Yes    A/P: Hypertension longstanding currently uncontrolled on current medications. BP goal < 130/80 mmHg. Medication adherence appears to be appropriate. BP is essentially unchanged since adding amlodipine  and she endorses goal home readings. I think we can safely have her increase amlodipine  to 10 mg daily. She will take as two of the 5 mg tablets (10mg  total) until she exhausts her current supply of amlodipine  5 mg tablets.  -Continued valsartan  and hydrochlorothiazide  at current doses.  -Increased dose of amlodipine  to 10 mg daily. Pt will complete her current 5 mg tablet rxn by taking two 5 mg tablets once daily (total dose of 10mg ) before picking up the 10 mg rxn from pharmacy.  -Patient educated on purpose, proper use, and potential adverse effects of amlodipine .  -F/u labs ordered - none -Counseled on lifestyle modifications for blood pressure control including reduced dietary sodium, increased exercise, adequate sleep. -Encouraged patient  to check BP at home and bring log of readings to next visit. Counseled on proper use of home BP cuff.   Results reviewed and written information provided.    Written patient instructions provided. Patient verbalized understanding of treatment plan.  Total time in face to face counseling 20 minutes.    Follow-up:  Pharmacist in 1 month.  [name], PharmD, JAQUELINE, CPP Clinical  Pharmacist [facility] [phone]

## 2024-03-22 DIAGNOSIS — S67193A Crushing injury of left middle finger, initial encounter: Secondary | ICD-10-CM | POA: Diagnosis not present

## 2024-03-22 DIAGNOSIS — S61313A Laceration without foreign body of left middle finger with damage to nail, initial encounter: Secondary | ICD-10-CM | POA: Diagnosis not present

## 2024-04-05 ENCOUNTER — Ambulatory Visit: Admitting: Pharmacist

## 2024-04-06 ENCOUNTER — Other Ambulatory Visit: Payer: Self-pay

## 2024-04-13 DIAGNOSIS — Z419 Encounter for procedure for purposes other than remedying health state, unspecified: Secondary | ICD-10-CM | POA: Diagnosis not present

## 2024-05-30 ENCOUNTER — Ambulatory Visit: Attending: Internal Medicine | Admitting: Internal Medicine

## 2024-05-30 ENCOUNTER — Encounter: Payer: Self-pay | Admitting: Internal Medicine

## 2024-05-30 ENCOUNTER — Other Ambulatory Visit: Payer: Self-pay

## 2024-05-30 VITALS — BP 173/82 | HR 73 | Ht 61.52 in | Wt 175.6 lb

## 2024-05-30 DIAGNOSIS — Z23 Encounter for immunization: Secondary | ICD-10-CM

## 2024-05-30 DIAGNOSIS — E119 Type 2 diabetes mellitus without complications: Secondary | ICD-10-CM

## 2024-05-30 DIAGNOSIS — E1159 Type 2 diabetes mellitus with other circulatory complications: Secondary | ICD-10-CM

## 2024-05-30 DIAGNOSIS — Z6832 Body mass index (BMI) 32.0-32.9, adult: Secondary | ICD-10-CM

## 2024-05-30 DIAGNOSIS — Z2821 Immunization not carried out because of patient refusal: Secondary | ICD-10-CM

## 2024-05-30 DIAGNOSIS — E210 Primary hyperparathyroidism: Secondary | ICD-10-CM

## 2024-05-30 DIAGNOSIS — I152 Hypertension secondary to endocrine disorders: Secondary | ICD-10-CM

## 2024-05-30 DIAGNOSIS — E66811 Obesity, class 1: Secondary | ICD-10-CM

## 2024-05-30 DIAGNOSIS — E6609 Other obesity due to excess calories: Secondary | ICD-10-CM

## 2024-05-30 DIAGNOSIS — E785 Hyperlipidemia, unspecified: Secondary | ICD-10-CM

## 2024-05-30 DIAGNOSIS — E1169 Type 2 diabetes mellitus with other specified complication: Secondary | ICD-10-CM

## 2024-05-30 DIAGNOSIS — I10 Essential (primary) hypertension: Secondary | ICD-10-CM

## 2024-05-30 LAB — POCT GLYCOSYLATED HEMOGLOBIN (HGB A1C): HbA1c, POC (controlled diabetic range): 6.6 % (ref 0.0–7.0)

## 2024-05-30 LAB — GLUCOSE, POCT (MANUAL RESULT ENTRY): POC Glucose: 134 mg/dL — AB (ref 70–99)

## 2024-05-30 NOTE — Progress Notes (Signed)
 "   Patient ID: [name], female    DOB: Sep 22, 1950  MRN: [id number]  CC: Medical Management of Chronic Issues (Hypertension follow up /Flu vax administered on 05/30/24 - C.A.)   Subjective: [name] is a 73 y.o. female who presents for chronic ds management. Her chronic active med issues include:  Patient with history of DM, HTN, HL, OA, hyperPTH, osteopenia.   Discussed the use of AI scribe software for clinical note transcription with the patient, who gave verbal consent to proceed.  History of Present Illness [name] is a 73 year old female with hypertension, hyperlipidemia, hyperparathyroidism, and diabetes who presents for a four-month follow-up visit.  HTN: Her blood pressure was elevated today at 189/93. She has been out of amlodipine  for almost three months due to a misunderstanding about her prescription. Started on 5 mg with me 4 mths ago; saw clinical pharmacist who increased the dose to 10 mg. She was instructed to take two 5 mg tablets daily until she was finished, then pick up the rxn for the 10 mg tabs. She misunderstood his instructions and never picked up the 10 mg. She continues to take valsartan  80 mg daily and hydrochlorothiazide  12.5 mg three times a week. She has been checking her blood pressure at home, noting readings of 140/90 and 130/80, but stopped due to anxiety over high readings. She is trying to limit her salt intake.  DM: Results for orders placed or performed in visit on 05/30/24  POCT glucose (manual entry)   Collection Time: 05/30/24  9:01 AM  Result Value Ref Range   POC Glucose 134 (A) 70 - 99 mg/dl  POCT glycosylated hemoglobin (Hb A1C)   Collection Time: 05/30/24  9:02 AM  Result Value Ref Range   Hemoglobin A1C     HbA1c POC (<> result, manual entry)     HbA1c, POC (prediabetic range)     HbA1c, POC (controlled diabetic range) 6.6 0.0 - 7.0 %  Her diabetes management shows an A1c of 6.6% today, up from 6.4% four  months ago. DM has been diet controlled. She attributes this increase in A1C to consuming more sweets, particularly over the holiday season. Her blood sugar was 134 mg/dL today. Her weight has remained stable at 175 pounds, up from 174 pounds in August. 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 She has not been walking outside due to cold weather but does stretching exercises at home. She is open to incorporating more physical activity into her routine.  HL: She continues to take atorvastatin  40 mg daily for hyperlipidemia. Her LDL cholesterol was last checked in August and was 47 mg/dL, which was within the target range.  HyperPTH: Regarding her hyperparathyroidism, she is taking vitamin D  supplements and had a bone density study in September 2024, which showed osteopenia.      Patient Active Problem List   Diagnosis Date Noted   Osteopenia of neck of right femur 12/09/2019   History of cataract 12/09/2019   Screening breast examination 07/07/2019   Primary hyperparathyroidism 04/24/2019   Adenomatous polyp of colon 07/29/2018   Scarring of skin 07/16/2017   Breast calcification, left 07/16/2017   Immunization due 04/03/2017   Hyperlipidemia associated with type 2 diabetes mellitus (HCC) 05/18/2016   Primary osteoarthritis involving multiple joints 05/16/2016   Diabetes type 2, controlled (HCC) 03/13/2015   Ptosis, both eyelids 03/13/2015   Bilateral cataracts 03/13/2015  Hypertension 10/06/2014     Medications Ordered Prior to Encounter[1]  Allergies[2]  Social History   Socioeconomic History   Marital status: Married    Spouse name: Not on file   Number of children: 3   Years of education: 12    Highest education level: High school graduate  Occupational History   Not on file  Tobacco Use   Smoking status: Never   Smokeless tobacco: Never  Vaping Use   Vaping status: Never Used  Substance  and Sexual Activity   Alcohol use: Not Currently   Drug use: No   Sexual activity: Not Currently    Birth control/protection: Post-menopausal  Other Topics Concern   Not on file  Social History Narrative   From [place]   Moved to [place]  on 09/11/2014      Has 3 children- 1 daughter in [place],    1 child in [place]   Social Drivers of Health   Tobacco Use: Low Risk (05/30/2024)   Patient History    Smoking Tobacco Use: Never    Smokeless Tobacco Use: Never    Passive Exposure: Not on file  Financial Resource Strain: Low Risk (03/20/2023)   Overall Financial Resource Strain (CARDIA)    Difficulty of Paying Living Expenses: Not very hard  Food Insecurity: No Food Insecurity (03/20/2023)   Hunger Vital Sign    Worried About Running Out of Food in the Last Year: Never true    Ran Out of Food in the Last Year: Never true  Transportation Needs: No Transportation Needs (03/20/2023)   PRAPARE - Administrator, Civil Service (Medical): No    Lack of Transportation (Non-Medical): No  Physical Activity: Insufficiently Active (03/20/2023)   Exercise Vital Sign    Days of Exercise per Week: 3 days    Minutes of Exercise per Session: 30 min  Stress: Stress Concern Present (03/20/2023)   [employer] of Occupational Health - Occupational Stress Questionnaire    Feeling of Stress : Rather much  Social Connections: Moderately Integrated (03/20/2023)   Social Connection and Isolation Panel    Frequency of Communication with Friends and Family: Twice a week    Frequency of Social Gatherings with Friends and Family: Once a week    Attends Religious Services: More than 4 times per year    Active Member of Clubs or Organizations: No    Attends Banker Meetings: Never    Marital Status: Married  Catering Manager Violence: Not At Risk (03/20/2023)   Humiliation, Afraid, Rape, and Kick questionnaire    Fear of Current or Ex-Partner: No    Emotionally Abused: No    Physically  Abused: No    Sexually Abused: No  Depression (PHQ2-9): Low Risk (01/21/2024)   Depression (PHQ2-9)    PHQ-2 Score: 0  Alcohol Screen: Low Risk (03/20/2023)   Alcohol Screen    Last Alcohol Screening Score (AUDIT): 0  Housing: Low Risk (03/20/2023)   Housing    Last Housing Risk Score: 0  Utilities: Not At Risk (03/20/2023)   AHC Utilities    Threatened with loss of utilities: No  Health Literacy: Adequate Health Literacy (03/20/2023)   B1300 Health Literacy    Frequency of need for help with medical instructions: Never    Family History  Problem Relation Age of Onset   Hypertension Mother    Diabetes Mother    Cancer Father        prostate   Diabetes Sister  Colon cancer Neg Hx    Esophageal cancer Neg Hx    Rectal cancer Neg Hx    Stomach cancer Neg Hx    Breast cancer Neg Hx     Past Surgical History:  Procedure Laterality Date   CATARACT EXTRACTION, BILATERAL     LT 01/31/2022, RT 12/2021   CESAREAN SECTION  1990   PTERYGIUM EXCISION Left 09/01/2020    ROS: Review of Systems Negative except as stated above  PHYSICAL EXAM: BP (!) 173/82   Pulse 73   Ht 5' 1.52 (1.563 m)   Wt 175 lb 9.6 oz (79.7 kg)   SpO2 97%   BMI 32.62 kg/m   Wt Readings from Last 3 Encounters:  05/30/24 175 lb 9.6 oz (79.7 kg)  01/21/24 174 lb (78.9 kg)  01/14/24 170 lb (77.1 kg)    Physical Exam  General appearance - alert, well appearing, and in no distress Mental status - normal mood, behavior, speech, dress, motor activity, and thought processes Neck - supple, no significant adenopathy Chest - decreased but clear to auscultation, no wheezes, rales or rhonchi, symmetric air entry Heart - normal rate, regular rhythm, normal S1, S2, no murmurs, rubs, clicks or gallops Extremities - trace BL LE edema Diabetic Foot Exam - Simple   Simple Foot Form Diabetic Foot exam was performed with the following findings: Yes 05/30/2024  9:32 AM  Visual Inspection See comments:  Yes Sensation Testing Intact to touch and monofilament testing bilaterally: Yes Pulse Check Posterior Tibialis and Dorsalis pulse intact bilaterally: Yes Comments Dry skin on soles both feet and at tip of nails.         Latest Ref Rng & Units 01/21/2024    9:49 AM 08/28/2022    4:38 PM 02/21/2022    9:12 AM  CMP  Glucose 70 - 99 mg/dL 890  87  885   BUN 8 - 27 mg/dL 21  11  14    Creatinine 0.57 - 1.00 mg/dL 9.08  9.33  9.24   Sodium 134 - 144 mmol/L 139  138  143   Potassium 3.5 - 5.2 mmol/L 4.3  4.0  4.7   Chloride 96 - 106 mmol/L 104  101  106   CO2 20 - 29 mmol/L 21  24  24    Calcium  8.7 - 10.3 mg/dL 89.1  89.9  88.9   Total Protein 6.0 - 8.5 g/dL 7.3  7.1    Total Bilirubin 0.0 - 1.2 mg/dL 0.6  0.5    Alkaline Phos 44 - 121 IU/L 72  60    AST 0 - 40 IU/L 27  30    ALT 0 - 32 IU/L 19  20     Lipid Panel     Component Value Date/Time   CHOL 117 01/21/2024 0949   TRIG 53 01/21/2024 0949   HDL 58 01/21/2024 0949   CHOLHDL 2.0 01/21/2024 0949   CHOLHDL 3.3 05/16/2016 0928   VLDL 21 05/16/2016 0928   LDLCALC 47 01/21/2024 0949    CBC    Component Value Date/Time   WBC 7.8 01/21/2024 0949   WBC 5.2 05/16/2016 0944   RBC 4.09 01/21/2024 0949   RBC 4.72 05/16/2016 0944   HGB 12.0 01/21/2024 0949   HCT 37.5 01/21/2024 0949   PLT 252 01/21/2024 0949   MCV 92 01/21/2024 0949   MCH 29.3 01/21/2024 0949   MCH 29.7 05/16/2016 0944   MCHC 32.0 01/21/2024 0949   MCHC 34.2 05/16/2016 0944  RDW 13.6 01/21/2024 0949   LYMPHSABS 2.0 11/26/2017 0913   MONOABS 0.8 10/06/2014 1049   EOSABS 0.1 11/26/2017 0913   BASOSABS 0.1 11/26/2017 0913   Last vitamin D  Lab Results  Component Value Date   VD25OH 44.4 01/21/2024     ASSESSMENT AND PLAN: 1. Diabetes mellitus type 2, diet-controlled (HCC) (Primary) A1C increased but still at goal on diet alone - Encouraged reduction in sweets consumption and portion control. - Advised on increasing physical activity, including  walking or marching in place for 20-30 minutes daily when too cold to walk outside. - Microalbumin / creatinine urine ratio - POCT glycosylated hemoglobin (Hb A1C) - POCT glucose (manual entry)  2. Hypertension associated with diabetes (HCC) Not at goal due to being out Norvasc  10 mg for 3 mths due to misunderstanding instructions from clinical pharmacist - Instructed to obtain amlodipine  10 mg from pharmacy today and resume taking. - Continue valsartan  80 mg daily and hydrochlorothiazide  12.5 mg three times a week. - Follow up with clinical pharmacist in one month to assess blood pressure control.  3. Hyperlipidemia associated with type 2 diabetes mellitus (HCC) Continue atorvastatin  40 mg daily. At goal  4. Primary hyperparathyroidism Primary hyperparathyroidism with osteopenia. Bone density study due in September 2026. Vitamin D  levels adequate. - Referred to endocrinologist for further management of hyperparathyroidism. - Ensure adequate calcium  and vitamin D  intake. - Encouraged regular exercise to maintain bone strength. - Ordered lab tests to check kidney function and calcium  levels. - Ambulatory referral to Endocrinology - Basic Metabolic Panel  5. Class 1 obesity due to excess calories with serious comorbidity and body mass index (BMI) of 32.0 to 32.9 in adult See #2 above  6. Need for immunization against influenza - Flu vaccine HIGH DOSE PF(Fluzone Trivalent)  7. Herpes zoster vaccination declined Recommended; pt declined.   Patient was given the opportunity to ask questions.  Patient verbalized understanding of the plan and was able to repeat key elements of the plan.   This documentation was completed using Paediatric nurse.  Any transcriptional errors are unintentional.  Orders Placed This Encounter  Procedures   Flu vaccine HIGH DOSE PF(Fluzone Trivalent)   Microalbumin / creatinine urine ratio   Basic Metabolic Panel   Ambulatory referral  to Endocrinology   POCT glycosylated hemoglobin (Hb A1C)   POCT glucose (manual entry)     Requested Prescriptions    No prescriptions requested or ordered in this encounter    Return in about 4 months (around 09/28/2024) for BP check Luke in 4 wks.  Barnie Louder, MD, FACP     [1]  Current Outpatient Medications on File Prior to Visit  Medication Sig Dispense Refill   Accu-Chek Softclix Lancets lancets Use to check blood sugar 1 times daily. 100 each 6   atorvastatin  (LIPITOR) 40 MG tablet Take 1 tablet (40 mg total) by mouth daily. 90 tablet 1   Cholecalciferol (VITAMIN D3) 10 MCG (400 UNIT) tablet Take 1 tablet (400 Units total) by mouth daily. 100 tablet 1   Glucosamine-Chondroitin 750-600 MG TABS Take 2 tablets by mouth daily.  0   hydrochlorothiazide  (HYDRODIURIL ) 12.5 MG tablet Take 1 tablet (12.5 mg total) by mouth every Monday, Wednesday, and Friday for blood pressure and lower extremity swelling. 36 tablet 1   Turmeric 500 MG CAPS Take 500 mg by mouth daily.     valsartan  (DIOVAN ) 80 MG tablet Take 1 tablet (80 mg total) by mouth daily. 90 tablet 1  amLODipine  (NORVASC ) 10 MG tablet Take 1 tablet (10 mg total) by mouth daily. (Patient not taking: Reported on 05/30/2024) 90 tablet 1   Blood Glucose Monitoring Suppl (ACCU-CHEK GUIDE) w/Device KIT Use to check blood sugar 1 times daily. (Patient not taking: Reported on 05/30/2024) 1 kit 0   glucose blood test strip Use to check blood sugar 1 times daily. (Patient not taking: Reported on 05/30/2024) 100 each 6   Current Facility-Administered Medications on File Prior to Visit  Medication Dose Route Frequency Provider Last Rate Last Admin   0.9 %  sodium chloride  infusion  500 mL Intravenous Once Beavers, Kimberly, MD      [2] No Known Allergies  "

## 2024-05-31 ENCOUNTER — Ambulatory Visit: Payer: Self-pay | Admitting: Internal Medicine

## 2024-05-31 LAB — MICROALBUMIN / CREATININE URINE RATIO
Creatinine, Urine: 40.8 mg/dL
Microalb/Creat Ratio: <7 mg/g{creat} (ref 0–29)
Microalbumin, Urine: <3 ug/mL

## 2024-05-31 LAB — BASIC METABOLIC PANEL WITH GFR
BUN/Creatinine Ratio: 15 (ref 12–28)
BUN: 12 mg/dL (ref 8–27)
CO2: 24 mmol/L (ref 20–29)
Calcium: 11.3 mg/dL — ABNORMAL HIGH (ref 8.7–10.3)
Chloride: 102 mmol/L (ref 96–106)
Creatinine, Ser: 0.8 mg/dL (ref 0.57–1.00)
Glucose: 119 mg/dL — ABNORMAL HIGH (ref 70–99)
Potassium: 4.3 mmol/L (ref 3.5–5.2)
Sodium: 139 mmol/L (ref 134–144)
eGFR: 78 mL/min/1.73

## 2024-06-27 ENCOUNTER — Telehealth: Payer: Self-pay | Admitting: Internal Medicine

## 2024-06-27 NOTE — Telephone Encounter (Signed)
 Contacted pt left vm to resch appt ( if the pt calls back please resch appt to afternoon or different day

## 2024-06-27 NOTE — Telephone Encounter (Signed)
 Due to inclement weather, our office will be closed tomorrow 06/28/2024 . Well reach out to reschedule your appointment. Thank you.Please call [phone] ( if the pt calls back please resch appt )

## 2024-06-28 ENCOUNTER — Ambulatory Visit: Payer: Self-pay | Admitting: Pharmacist

## 2024-07-05 ENCOUNTER — Other Ambulatory Visit: Payer: Self-pay

## 2024-07-05 ENCOUNTER — Ambulatory Visit: Admitting: Pharmacist

## 2024-07-12 ENCOUNTER — Ambulatory Visit: Admitting: Pharmacist

## 2024-09-20 ENCOUNTER — Ambulatory Visit: Payer: Self-pay | Admitting: Internal Medicine
